# Patient Record
Sex: Female | Born: 1940 | Race: White | Hispanic: No | Marital: Married | Smoking: Never smoker
Health system: Southern US, Community
[De-identification: ages and names within clinical notes are randomized; demographics above are authoritative.]

## PROBLEM LIST (undated history)

## (undated) DIAGNOSIS — J45909 Unspecified asthma, uncomplicated: Secondary | ICD-10-CM

## (undated) DIAGNOSIS — E785 Hyperlipidemia, unspecified: Secondary | ICD-10-CM

## (undated) DIAGNOSIS — F329 Major depressive disorder, single episode, unspecified: Secondary | ICD-10-CM

## (undated) DIAGNOSIS — F32A Depression, unspecified: Secondary | ICD-10-CM

## (undated) DIAGNOSIS — K219 Gastro-esophageal reflux disease without esophagitis: Secondary | ICD-10-CM

## (undated) DIAGNOSIS — M1990 Unspecified osteoarthritis, unspecified site: Secondary | ICD-10-CM

## (undated) DIAGNOSIS — K589 Irritable bowel syndrome without diarrhea: Secondary | ICD-10-CM

## (undated) DIAGNOSIS — G709 Myoneural disorder, unspecified: Secondary | ICD-10-CM

## (undated) DIAGNOSIS — R519 Headache, unspecified: Secondary | ICD-10-CM

## (undated) DIAGNOSIS — Z8719 Personal history of other diseases of the digestive system: Secondary | ICD-10-CM

## (undated) DIAGNOSIS — I10 Essential (primary) hypertension: Secondary | ICD-10-CM

## (undated) DIAGNOSIS — R51 Headache: Secondary | ICD-10-CM

## (undated) HISTORY — PX: EYE SURGERY: SHX253

## (undated) HISTORY — DX: Unspecified osteoarthritis, unspecified site: M19.90

## (undated) HISTORY — DX: Unspecified asthma, uncomplicated: J45.909

## (undated) HISTORY — PX: ABDOMINAL HYSTERECTOMY: SHX81

## (undated) HISTORY — PX: SHOULDER ARTHROSCOPY: SHX128

## (undated) HISTORY — PX: APPENDECTOMY: SHX54

## (undated) HISTORY — PX: CHOLECYSTECTOMY: SHX55

## (undated) HISTORY — PX: KNEE ARTHROSCOPY: SUR90

---

## 2003-08-25 ENCOUNTER — Other Ambulatory Visit: Payer: Self-pay

## 2005-02-27 ENCOUNTER — Ambulatory Visit: Payer: Self-pay | Admitting: Internal Medicine

## 2005-03-04 ENCOUNTER — Ambulatory Visit: Payer: Self-pay | Admitting: Internal Medicine

## 2005-03-06 ENCOUNTER — Ambulatory Visit: Payer: Self-pay | Admitting: Internal Medicine

## 2005-05-28 ENCOUNTER — Ambulatory Visit: Payer: Self-pay | Admitting: Neurosurgery

## 2005-07-16 ENCOUNTER — Ambulatory Visit: Payer: Self-pay | Admitting: Neurosurgery

## 2005-09-07 ENCOUNTER — Encounter: Payer: Self-pay | Admitting: Orthopaedic Surgery

## 2005-09-10 ENCOUNTER — Encounter: Payer: Self-pay | Admitting: Orthopaedic Surgery

## 2005-09-12 ENCOUNTER — Emergency Department: Payer: Self-pay | Admitting: Emergency Medicine

## 2005-10-08 ENCOUNTER — Encounter: Payer: Self-pay | Admitting: Orthopaedic Surgery

## 2005-10-19 ENCOUNTER — Ambulatory Visit: Payer: Self-pay

## 2005-10-23 ENCOUNTER — Ambulatory Visit: Payer: Self-pay

## 2005-11-08 ENCOUNTER — Encounter: Payer: Self-pay | Admitting: Orthopaedic Surgery

## 2006-10-27 ENCOUNTER — Encounter: Payer: Self-pay | Admitting: Internal Medicine

## 2006-11-09 ENCOUNTER — Encounter: Payer: Self-pay | Admitting: Internal Medicine

## 2007-01-31 ENCOUNTER — Ambulatory Visit: Payer: Self-pay | Admitting: Ophthalmology

## 2007-01-31 ENCOUNTER — Other Ambulatory Visit: Payer: Self-pay

## 2007-02-08 ENCOUNTER — Ambulatory Visit: Payer: Self-pay | Admitting: Ophthalmology

## 2007-06-14 ENCOUNTER — Ambulatory Visit: Payer: Self-pay | Admitting: Internal Medicine

## 2007-12-05 ENCOUNTER — Ambulatory Visit: Payer: Self-pay | Admitting: Rheumatology

## 2007-12-20 ENCOUNTER — Encounter: Payer: Self-pay | Admitting: Rheumatology

## 2008-01-09 ENCOUNTER — Encounter: Payer: Self-pay | Admitting: Rheumatology

## 2008-01-11 ENCOUNTER — Ambulatory Visit: Payer: Self-pay | Admitting: Obstetrics & Gynecology

## 2008-06-14 ENCOUNTER — Ambulatory Visit: Payer: Self-pay | Admitting: Internal Medicine

## 2008-08-21 ENCOUNTER — Ambulatory Visit: Payer: Self-pay | Admitting: Internal Medicine

## 2009-06-17 ENCOUNTER — Ambulatory Visit: Payer: Self-pay | Admitting: Internal Medicine

## 2009-07-08 ENCOUNTER — Encounter: Payer: Self-pay | Admitting: Urology

## 2009-07-10 ENCOUNTER — Encounter: Payer: Self-pay | Admitting: Urology

## 2009-08-10 ENCOUNTER — Encounter: Payer: Self-pay | Admitting: Urology

## 2010-06-19 ENCOUNTER — Ambulatory Visit: Payer: Self-pay | Admitting: Internal Medicine

## 2010-07-07 ENCOUNTER — Ambulatory Visit: Payer: Self-pay

## 2010-07-21 ENCOUNTER — Ambulatory Visit: Payer: Self-pay | Admitting: Orthopedic Surgery

## 2010-08-06 ENCOUNTER — Encounter: Payer: Self-pay | Admitting: Physician Assistant

## 2010-08-10 ENCOUNTER — Encounter: Payer: Self-pay | Admitting: Physician Assistant

## 2010-09-27 ENCOUNTER — Emergency Department: Payer: Self-pay | Admitting: Emergency Medicine

## 2010-10-22 ENCOUNTER — Ambulatory Visit: Payer: Self-pay | Admitting: Gastroenterology

## 2010-12-26 NOTE — Assessment & Plan Note (Signed)
NAME:  Ann Benson, LANDRUS NO.:  [id number]   MEDICAL RECORD NO.:  [id number]          PATIENT TYPE:  POB   LOCATION:  [facility] at [facility]         FACILITY:  [facility]   PHYSICIAN:  Allie Bossier, MD        DATE OF BIRTH:  June 16, 1941   DATE OF SERVICE:                                  CLINIC NOTE   Ms. Kempton is a very pleasant 70 year old white female on G2, P2 who  comes here for evaluation of vaginal blisters and burning with  urination.  She says that she has seen previous physicians and was told  it was not herpes. She has tried Premarin cream (3 tubes) and denies  any improvement.  She says that even though she is a newly wed  (approximately 1 year).  She is rarely able to have sex because of the  atrophy and pain.  External genitalia, she has severe atrophy.  She has  excoriations of her left labia minora near the clitoris and obliteration  of the clitoris due to overlying labia minora.  In her mouth, on her  left upper gum mucosa she has lesions consistent with lichen planus.   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  I have given  her Temovate cream to be used every night for a week and then 3 times a  week.  She will followup in 1-2 months.  If no improvement, I would  suggest a biopsy and I will order HSV-2 iGg to be started.  There is no  HSV involvement.      Allie Bossier, MD     MCD/MEDQ  D:  01/19/2008  T:  01/19/2008  Job:  [id number]

## 2011-01-08 ENCOUNTER — Encounter: Payer: Self-pay | Admitting: Internal Medicine

## 2011-01-09 ENCOUNTER — Encounter: Payer: Self-pay | Admitting: Internal Medicine

## 2011-01-12 ENCOUNTER — Ambulatory Visit: Payer: Self-pay | Admitting: Gastroenterology

## 2011-03-18 ENCOUNTER — Ambulatory Visit: Payer: Self-pay | Admitting: Internal Medicine

## 2011-03-23 ENCOUNTER — Emergency Department: Payer: Self-pay | Admitting: Unknown Physician Specialty

## 2011-03-24 ENCOUNTER — Ambulatory Visit: Payer: Self-pay | Admitting: Unknown Physician Specialty

## 2011-06-22 ENCOUNTER — Ambulatory Visit: Payer: Self-pay | Admitting: Internal Medicine

## 2011-06-23 ENCOUNTER — Ambulatory Visit: Payer: Self-pay | Admitting: Internal Medicine

## 2012-06-23 ENCOUNTER — Ambulatory Visit: Payer: Self-pay | Admitting: Internal Medicine

## 2013-07-10 ENCOUNTER — Ambulatory Visit: Payer: Self-pay | Admitting: Internal Medicine

## 2013-07-10 HISTORY — PX: BREAST BIOPSY: SHX20

## 2013-07-24 ENCOUNTER — Ambulatory Visit: Payer: Self-pay | Admitting: Internal Medicine

## 2013-07-26 ENCOUNTER — Ambulatory Visit: Payer: Self-pay | Admitting: Internal Medicine

## 2013-07-28 LAB — PATHOLOGY REPORT

## 2013-09-12 ENCOUNTER — Ambulatory Visit: Payer: Self-pay | Admitting: Internal Medicine

## 2013-10-18 ENCOUNTER — Ambulatory Visit: Payer: Self-pay | Admitting: Internal Medicine

## 2013-10-27 ENCOUNTER — Encounter: Payer: Self-pay | Admitting: Nurse Practitioner

## 2013-11-08 ENCOUNTER — Encounter: Payer: Self-pay | Admitting: Nurse Practitioner

## 2013-12-08 ENCOUNTER — Encounter: Payer: Self-pay | Admitting: Nurse Practitioner

## 2013-12-21 DIAGNOSIS — R600 Localized edema: Secondary | ICD-10-CM | POA: Insufficient documentation

## 2013-12-30 ENCOUNTER — Observation Stay: Payer: Self-pay | Admitting: Internal Medicine

## 2013-12-30 LAB — COMPREHENSIVE METABOLIC PANEL
Albumin: 4.1 g/dL (ref 3.4–5.0)
Alkaline Phosphatase: 76 U/L
Anion Gap: 5 — ABNORMAL LOW (ref 7–16)
BUN: 15 mg/dL (ref 7–18)
Bilirubin,Total: 0.7 mg/dL (ref 0.2–1.0)
Calcium, Total: 9.3 mg/dL (ref 8.5–10.1)
Chloride: 105 mmol/L (ref 98–107)
Co2: 28 mmol/L (ref 21–32)
Creatinine: 1.02 mg/dL (ref 0.60–1.30)
EGFR (African American): >60
EGFR (Non-African Amer.): 54 — ABNORMAL LOW
Glucose: 106 mg/dL — ABNORMAL HIGH (ref 65–99)
Osmolality: 277 (ref 275–301)
Potassium: 4.3 mmol/L (ref 3.5–5.1)
SGOT(AST): 45 U/L — ABNORMAL HIGH (ref 15–37)
SGPT (ALT): 36 U/L (ref 12–78)
Sodium: 138 mmol/L (ref 136–145)
Total Protein: 8.2 g/dL (ref 6.4–8.2)

## 2013-12-30 LAB — CBC
HCT: 50.2 % — ABNORMAL HIGH (ref 35.0–47.0)
HGB: 16.4 g/dL — ABNORMAL HIGH (ref 12.0–16.0)
MCH: 29.3 pg (ref 26.0–34.0)
MCHC: 32.7 g/dL (ref 32.0–36.0)
MCV: 90 fL (ref 80–100)
Platelet: 188 10*3/uL (ref 150–440)
RBC: 5.6 10*6/uL — ABNORMAL HIGH (ref 3.80–5.20)
RDW: 13.7 % (ref 11.5–14.5)
WBC: 7.2 10*3/uL (ref 3.6–11.0)

## 2013-12-30 LAB — TROPONIN I
Troponin-I: <0.02 ng/mL
Troponin-I: <0.02 ng/mL
Troponin-I: <0.02 ng/mL

## 2013-12-30 LAB — CK TOTAL AND CKMB (NOT AT ARMC)
CK, Total: 64 U/L
CK-MB: 2.1 ng/mL (ref 0.5–3.6)

## 2013-12-30 LAB — LIPASE, BLOOD: Lipase: 102 U/L (ref 73–393)

## 2013-12-31 LAB — LIPID PANEL
Cholesterol: 250 mg/dL — ABNORMAL HIGH (ref 0–200)
HDL Cholesterol: 31 mg/dL — ABNORMAL LOW (ref 40–60)
Ldl Cholesterol, Calc: 183 mg/dL — ABNORMAL HIGH (ref 0–100)
Triglycerides: 182 mg/dL (ref 0–200)
VLDL Cholesterol, Calc: 36 mg/dL (ref 5–40)

## 2013-12-31 LAB — BASIC METABOLIC PANEL
Anion Gap: 7 (ref 7–16)
BUN: 14 mg/dL (ref 7–18)
Calcium, Total: 8.8 mg/dL (ref 8.5–10.1)
Chloride: 103 mmol/L (ref 98–107)
Co2: 27 mmol/L (ref 21–32)
Creatinine: 1.02 mg/dL (ref 0.60–1.30)
EGFR (African American): >60
EGFR (Non-African Amer.): 54 — ABNORMAL LOW
Glucose: 96 mg/dL (ref 65–99)
Osmolality: 274 (ref 275–301)
Potassium: 4.1 mmol/L (ref 3.5–5.1)
Sodium: 137 mmol/L (ref 136–145)

## 2013-12-31 LAB — CBC WITH DIFFERENTIAL/PLATELET
Basophil #: 0 10*3/uL (ref 0.0–0.1)
Basophil %: 0.6 %
Eosinophil #: 0.1 10*3/uL (ref 0.0–0.7)
Eosinophil %: 2 %
HCT: 46.8 % (ref 35.0–47.0)
HGB: 15.6 g/dL (ref 12.0–16.0)
Lymphocyte #: 3.1 10*3/uL (ref 1.0–3.6)
Lymphocyte %: 40.3 %
MCH: 30.1 pg (ref 26.0–34.0)
MCHC: 33.4 g/dL (ref 32.0–36.0)
MCV: 90 fL (ref 80–100)
Monocyte #: 0.8 x10 3/mm (ref 0.2–0.9)
Monocyte %: 10.6 %
Neutrophil #: 3.5 10*3/uL (ref 1.4–6.5)
Neutrophil %: 46.5 %
Platelet: 179 10*3/uL (ref 150–440)
RBC: 5.19 10*6/uL (ref 3.80–5.20)
RDW: 14 % (ref 11.5–14.5)
WBC: 7.6 10*3/uL (ref 3.6–11.0)

## 2013-12-31 LAB — TSH: Thyroid Stimulating Horm: 2.85 u[IU]/mL

## 2014-01-01 LAB — SEDIMENTATION RATE: Erythrocyte Sed Rate: 21 mm/hr (ref 0–30)

## 2014-01-02 LAB — CBC WITH DIFFERENTIAL/PLATELET
Basophil #: 0 10*3/uL (ref 0.0–0.1)
Basophil %: 0.5 %
Eosinophil #: 0.2 10*3/uL (ref 0.0–0.7)
Eosinophil %: 2.8 %
HCT: 45.5 % (ref 35.0–47.0)
HGB: 15.1 g/dL (ref 12.0–16.0)
Lymphocyte #: 3.1 10*3/uL (ref 1.0–3.6)
Lymphocyte %: 38.7 %
MCH: 30 pg (ref 26.0–34.0)
MCHC: 33.2 g/dL (ref 32.0–36.0)
MCV: 90 fL (ref 80–100)
Monocyte #: 1 x10 3/mm — ABNORMAL HIGH (ref 0.2–0.9)
Monocyte %: 12.2 %
Neutrophil #: 3.7 10*3/uL (ref 1.4–6.5)
Neutrophil %: 45.8 %
Platelet: 167 10*3/uL (ref 150–440)
RBC: 5.04 10*6/uL (ref 3.80–5.20)
RDW: 14 % (ref 11.5–14.5)
WBC: 8 10*3/uL (ref 3.6–11.0)

## 2014-01-08 ENCOUNTER — Ambulatory Visit: Payer: Self-pay | Admitting: Orthopedic Surgery

## 2014-01-15 ENCOUNTER — Ambulatory Visit: Payer: Self-pay | Admitting: Orthopedic Surgery

## 2014-01-16 ENCOUNTER — Ambulatory Visit: Payer: Self-pay | Admitting: Orthopedic Surgery

## 2014-01-18 LAB — PATHOLOGY REPORT

## 2014-03-15 ENCOUNTER — Ambulatory Visit: Payer: Self-pay | Admitting: Internal Medicine

## 2014-11-21 ENCOUNTER — Ambulatory Visit
Admit: 2014-11-21 | Disposition: A | Discharge status: Home / Self Care | Payer: Self-pay | Attending: Unknown Physician Specialty | Admitting: Unknown Physician Specialty

## 2014-11-27 DIAGNOSIS — R1310 Dysphagia, unspecified: Secondary | ICD-10-CM | POA: Insufficient documentation

## 2014-12-01 NOTE — Discharge Summary (Signed)
PATIENT NAME:  Ann Benson, Ann Benson MR#:  [id number] DATE OF BIRTH:  12-25-1940  DATE OF ADMISSION:  12/30/2013 DATE OF DISCHARGE: 01/03/2014   FINAL DIAGNOSES:  1.  Chest pain, noncardiac, likely gastritis.  2.  Vertigo.  3.  Anxiety.  4.  Hyperlipidemia.  5.  History of Schatzki's ring.  6.  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   HISTORY AND PHYSICAL: Please see dictated admission history and physical.   HOSPITAL COURSE: The patient was admitted with episode of nausea, vomiting, chest discomfort, as well as dizziness and vertigo. Cardiac enzymes were followed, which were negative. She was seen by cardiology, and she underwent stress testing, which was negative. Echocardiogram also result revealed preserved LV function without significant valvular heart disease. Proton pump inhibitors were increased. Meclizine was added as well. With this combination of medications, her symptoms resolved completely. Physical therapy ambulated the patient and also will put her through some neck exercises, and she did well with this. It was felt that she was ready for discharge to home and that she should resume her home exercise regimen, did not require physical therapy to be ongoing. At time of discharge, she is in stable condition. Her physical activity will be up as tolerated. She will follow up in our office in the next 1 to 2 weeks. She should follow a 2 gram sodium diet.   DISCHARGE MEDICATIONS:  1.  Temazepam 30 mg  p.o. at bedtime.  2.  Cymbalta 60 mg p.o. daily.  3.  Pantoprazole 40 mg p.o. b.i.d.  4.  Meclizine 12.5 mg p.o. q.6 hours as needed for vertigo or dizziness. 5.  Ondansetron 4 mg p.o. t.i.d. as needed for nausea or vomiting.  6.  Buspirone 15 mg 1/2to 1 tablet p.o. b.i.d. as needed for anxiety or nervousness.   It was recommended she stop vitamin C and vitamin E. She should avoid Aleve and Advil, may use Tylenol as needed for further pain. She will follow  up with vascular surgery and with orthopedics for her lower back as is already planned.   ____________________________ Lynnea FerrierBert J. Klein III, MD bjk:aw D: 01/03/2014 13:15:32 ET T: 01/03/2014 13:47:16 ET JOB#: [id number]  cc: Lynnea FerrierBert J. Klein III, MD, <Dictator> [name] MD ELECTRONICALLY SIGNED 01/10/2014 8:10

## 2014-12-01 NOTE — H&P (Signed)
PATIENT NAME:  Ann Benson, Ann Benson MR#:  [id number] DATE OF BIRTH:  [birth year]  DATE OF ADMISSION:  12/30/2013  PRIMARY CARE PHYSICIAN:  Dr. [name].  CHIEF COMPLAINT:  Chest pain.   HISTORY OF PRESENT ILLNESS:  This is a 74 year old female who states that all of her problems started after she fell down the steps in March.  She developed some swelling in the legs and has not been right since, some pain in the neck.  Yesterday she was at the [employer] store, felt sick and dizzy, felt tightness in the chest, very nauseated.  She took a few sips of Pepsi, was able to drive herself home.  She was also short of breath at that time.  Once she got home she did vomit.  She states that her chest pain did radiate up into the jaw and into her teeth.  Before this episode at the [employer] store she felt like she was going to pass out.  This morning when she woke up she felt dizzy and nauseous.  Did not eat much.  No complaints of any diarrhea.  She did have some chest tightness also.  Hospitalist services were contacted for further evaluation.   PAST MEDICAL HISTORY:  Leg swelling, hyperlipidemia, history of esophageal dilation with a Schatzki's ring in the past and arthritis.   PAST SURGICAL HISTORY:  Cholecystectomy, shoulder surgery, partial hysterectomy, breast biopsy.   ALLERGIES:  IN THE COMPUTER LISTED AS ASPIRIN, STATINS AND TYLENOL.   MEDICATIONS:  Include Protonix 40 mg daily, Omega 3 2 pills a day, Cymbalta 60 mg daily, temazepam at night as needed.   SOCIAL HISTORY:  No smoking.  No alcohol.  No drug use.  Lives with her husband.  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   FAMILY HISTORY:  Father died of cirrhosis.  Mother had diabetes.  Two brothers that died, one of sepsis after his gallbladder ruptured, another one of heart failure.  He had diabetes.   REVIEW OF SYSTEMS:  CONSTITUTIONAL:  Positive for sweating.  No fever or chills.  Positive for weakness.   Positive for weight gain.  EYES:  She does have a cataract on the left side.  EARS, NOSE, MOUTH AND THROAT:  Positive for dysphagia in the past and currently with meats.  CARDIOVASCULAR:  Positive for chest pain.  RESPIRATORY:  Positive for shortness of breath.  GASTROINTESTINAL:  Positive for nausea, vomiting, constipation, blood with hemorrhoids.  GENITOURINARY:  No burning on urination.  No hematuria.  MUSCULOSKELETAL:  Positive for joint pain.  INTEGUMENT:  No rashes or eruptions.  NEUROLOGIC:  She felt faint.  PSYCHIATRIC:  Positive for anxiety.  ENDOCRINE:  No thyroid problems.  HEMATOLOGIC AND LYMPHATIC:  No anemia, no easy bruising or bleeding.   PHYSICAL EXAMINATION: VITAL SIGNS:  Temperature 97.7, pulse 73, respirations 18, blood pressure 146/74, pulse ox 94% on room air.  GENERAL:  No respiratory distress.  EYES:  Conjunctivae and lids normal.  Pupils equal, round, and reactive to light.  Extraocular muscles intact.  No nystagmus.  EARS, NOSE, MOUTH AND THROAT:  Tympanic membranes:  No erythema.  Nasal mucosa:  No erythema.  Throat:  No erythema.  No exudate seen.  Lips and gums:  No lesions.  NECK:  No JVD.  No bruits.  No lymphadenopathy.  No thyromegaly.  No thyroid nodules palpated.  LUNGS:  Clear to auscultation.  No use of accessory muscles to breathe.  No rhonchi, rales, or wheeze  heard.  CARDIOVASCULAR:  S1, S2 normal.  No gallops, rubs, or murmurs heard.  Carotid upstroke 2+ bilaterally.  Dorsalis pedis pulses 1+ bilaterally.  Trace edema of the lower extremity.  ABDOMEN:  Soft, nontender.  No organomegaly/splenomegaly.  Normoactive bowel sounds.  No masses felt.  Chest wall:  No pain to palpation.  LYMPHATIC:  No lymph nodes in the neck.  MUSCULOSKELETAL:  Trace edema.  No clubbing.  No cyanosis.  SKIN:  No ulcers or lesions seen.  On the lower extremities, cool to touch. NEUROLOGIC:  Cranial nerves II through XII grossly intact.  Deep tendon reflexes half plus  bilateral lower extremity.  PSYCHIATRIC:  The patient is oriented to person, place, and time.   LABORATORY AND RADIOLOGICAL DATA:  Chest x-ray:  No acute cardiopulmonary disease.  L1 compression fracture deformity.  CPK 64, glucose 106, BUN 15, creatinine 1.02, sodium 138, potassium 4.3, chloride 105, CO2 28, calcium 9.3.  Liver function tests:  AST slightly elevated at 45.  White blood cell count 7.2.  Hemoglobin 16.4, hematocrit 50.2, platelet count of 188.  Troponin negative.  Lipase 102.   EKG:  Flipped T waves laterally.   ASSESSMENT AND PLAN: 1.  Chest pain, on a holiday weekend, unfortunately, no stress test available.  I spoke with Dr. Lady Gary cardiology to evaluate.  We will get serial cardiac enzymes to rule out myocardial infarction.  THE PATIENT HAS AN ALLERGY TO ASPIRIN.  IT IS NOT A REAL ALLERGY.  IT IS MORE THAT SHE BLEEDS EASILY.  If she does have an acute myocardial infarction we can give aspirin.  I will hold off on aspirin at this point because I am not sure if this is gastrointestinal related.  I will try a gastrointestinal cocktail and increase the Protonix to twice a day.  I will stop the omega-3 fatty acid because that can also cause an upset stomach.  Could also be anxiety related.  The patient is supposed to go on a trip on Tuesday, probably will not be able to get a cardiac work-up prior to that.  2.  Hyperlipidemia.  We will hold on the omega-3 fatty acids.  3.  Anxiety.  Continue Cymbalta.  We will give some as needed Xanax.  4.  Gastroesophageal reflux disease with history of Schatzki's ring with dilatation in the past.  We will increase to twice daily proton pump inhibitor and continue to monitor.    CODE STATUS:  FULL CODE.  Time spent on admission 55 minutes.     ____________________________ Herschell Dimes. Renae Gloss, MD rjw:ea D: 12/30/2013 13:57:14 ET T: 12/30/2013 16:29:50 ET JOB#: [id number]  cc: Herschell Dimes. Renae Gloss, MD, <Dictator> Curtis Sites III, MD Salley Scarlet MD ELECTRONICALLY SIGNED 12/30/2013 17:41

## 2014-12-01 NOTE — Op Note (Signed)
PATIENT NAME:  Ann Benson, Maddix W MR#:  [id number] DATE OF BIRTH:  1941-02-09  DATE OF PROCEDURE:  01/16/2014  PREOPERATIVE DIAGNOSIS: L1 compression fracture.   POSTOPERATIVE DIAGNOSIS: L1 compression fracture.   PROCEDURE: L1 kyphoplasty and biopsy.   ANESTHESIA: MAC.   DESCRIPTION OF PROCEDURE: The patient was brought to the operating room and after adequate anesthesia was obtained, the patient was placed prone. C-arm was brought in and good visualization and AP and lateral projections were obtained at L1. After patient identification and timeout procedures were completed, 10 mL of 1% Xylocaine was infiltrated on both sides in the subcutaneous tissue. The back was then prepped and draped in the usual sterile fashion. Repeat timeout procedure completed. Spinal needle was used to inject a 50:50 mixture of 1% Xylocaine and 0.5% Sensorcaine with epinephrine down to the pedicle. This was done on both sides with approximately 20 mL on both sides. A small stab incision was made on the right, trocar advanced and a perpendicular approach gave access to the vertebral body. A biopsy was obtained without difficulty and then drilling.  I actually carried this crossed the midline, so a single-sided stick was all that was required. The balloon was inflated to approximately 3 mL with partial correction of deformity. The balloon was removed and the cement inserted with good fill. The trocar was removed and permanent C-arm views were obtained. The wounds was closed with Dermabond and covered with a Band-Aid. The patient was then sent to the recovery room in stable condition.   ESTIMATED BLOOD LOSS: Minimal.   COMPLICATIONS: None.   SPECIMEN: L1 vertebral body biopsy.   CONDITION: To recovery room stable.     ____________________________ Leitha SchullerMichael J. Menz, MD mjm:dmm D: 01/16/2014 20:14:00 ET T: 01/16/2014 21:25:46 ET JOB#: [id number]  cc: Leitha SchullerMichael J. Menz, MD, <Dictator> Leitha SchullerMICHAEL J MENZ MD ELECTRONICALLY  SIGNED 01/17/2014 8:09

## 2014-12-01 NOTE — Consult Note (Signed)
Brief Consult Note: Diagnosis: chest pain with dizziness and leg pain.   Patient was seen by consultant.   Recommend further assessment or treatment.   Orders entered.   Comments: 74 yo female with no cardiac history who was admitted iwth an episode of dizziness, chest pain, jaw pain, nausea and diapharesis. Has ruled out for mi. CHest ct negative for pe. Has leg pain of unclear etiology. WIll need to rule out structural disease as well as ischemia. WIll do echo and lexiscan mibi in am. Further recs after this. Conintue curent meds.  Electronic Signatures: Dalia HeadingFath, Kenneth A (MD)  (Signed 25-May-15 11:37)  Authored: Brief Consult Note   Last Updated: 25-May-15 11:37 by Dalia HeadingFath, Kenneth A (MD)

## 2014-12-05 ENCOUNTER — Ambulatory Visit
Admit: 2014-12-05 | Disposition: A | Discharge status: Home / Self Care | Payer: Self-pay | Attending: Unknown Physician Specialty | Admitting: Unknown Physician Specialty

## 2014-12-06 LAB — SURGICAL PATHOLOGY

## 2015-04-11 DIAGNOSIS — I10 Essential (primary) hypertension: Secondary | ICD-10-CM | POA: Insufficient documentation

## 2015-04-22 ENCOUNTER — Other Ambulatory Visit: Payer: Self-pay | Admitting: Internal Medicine

## 2015-04-22 DIAGNOSIS — Z1231 Encounter for screening mammogram for malignant neoplasm of breast: Secondary | ICD-10-CM

## 2015-04-25 ENCOUNTER — Ambulatory Visit
Admission: RE | Admit: 2015-04-25 | Discharge: 2015-04-25 | Disposition: A | Discharge status: Home / Self Care | Payer: PPO | Source: Ambulatory Visit | Attending: Internal Medicine | Admitting: Internal Medicine

## 2015-04-25 DIAGNOSIS — Z1231 Encounter for screening mammogram for malignant neoplasm of breast: Secondary | ICD-10-CM

## 2015-04-25 DIAGNOSIS — R928 Other abnormal and inconclusive findings on diagnostic imaging of breast: Secondary | ICD-10-CM | POA: Diagnosis not present

## 2015-04-29 ENCOUNTER — Other Ambulatory Visit: Payer: Self-pay | Admitting: Internal Medicine

## 2015-04-29 DIAGNOSIS — R928 Other abnormal and inconclusive findings on diagnostic imaging of breast: Secondary | ICD-10-CM

## 2015-05-02 ENCOUNTER — Ambulatory Visit
Admission: RE | Admit: 2015-05-02 | Discharge: 2015-05-02 | Disposition: A | Discharge status: Home / Self Care | Payer: PPO | Source: Ambulatory Visit | Attending: Internal Medicine | Admitting: Internal Medicine

## 2015-05-02 DIAGNOSIS — R928 Other abnormal and inconclusive findings on diagnostic imaging of breast: Secondary | ICD-10-CM

## 2015-05-02 DIAGNOSIS — N63 Unspecified lump in breast: Secondary | ICD-10-CM | POA: Insufficient documentation

## 2015-09-18 ENCOUNTER — Ambulatory Visit
Admission: RE | Admit: 2015-09-18 | Discharge: 2015-09-18 | Disposition: A | Discharge status: Home / Self Care | Payer: PPO | Source: Ambulatory Visit | Attending: Internal Medicine | Admitting: Internal Medicine

## 2015-09-18 ENCOUNTER — Other Ambulatory Visit: Payer: Self-pay | Admitting: Internal Medicine

## 2015-09-18 ENCOUNTER — Other Ambulatory Visit: Payer: PPO

## 2015-09-18 DIAGNOSIS — R600 Localized edema: Secondary | ICD-10-CM

## 2015-09-18 DIAGNOSIS — M79605 Pain in left leg: Secondary | ICD-10-CM

## 2015-09-18 DIAGNOSIS — I10 Essential (primary) hypertension: Secondary | ICD-10-CM | POA: Diagnosis not present

## 2015-09-19 ENCOUNTER — Ambulatory Visit (INDEPENDENT_AMBULATORY_CARE_PROVIDER_SITE_OTHER): Payer: PPO | Admitting: Podiatry

## 2015-09-19 ENCOUNTER — Ambulatory Visit (INDEPENDENT_AMBULATORY_CARE_PROVIDER_SITE_OTHER): Payer: PPO

## 2015-09-19 ENCOUNTER — Encounter: Payer: Self-pay | Admitting: Podiatry

## 2015-09-19 DIAGNOSIS — L603 Nail dystrophy: Secondary | ICD-10-CM

## 2015-09-19 DIAGNOSIS — R52 Pain, unspecified: Secondary | ICD-10-CM

## 2015-09-19 NOTE — Progress Notes (Signed)
   Subjective:    Patient ID: Ann Benson, female    DOB: [birth year], 75 y.o.   MRN: [id number]  HPI  Ann Benson presents to the office today for pain to both of her big toes which has been ongoing for about 1 year. She states the toes "throb" at times. She is unsure if it is the actual toe or the toenail. She states it hurts when she walks a lot at the [facility]. The pain has been progressive. She states she has been having some swelling to her legs but saw her PCP yesterday and was negative for blood clot on ultrasound. Started lasix yesterday, but has not started yet. No injury or trauma recently. She did fall down the steps last year. She did seek treatment for this.     Review of Systems  All other systems reviewed and are negative.      Objective:   Physical Exam General: AAO x3, NAD  Dermatological: Bilateral hallux nails are hypertrophic, dystrophic, discolored, elongated. There appears to be some subungual hematoma underlying both hallux toenails. There is also incurvation of the distal portion of both the medial lateral nail borders. No surrounding erythema or drainage. Tenderness to palpation to both toenails.   Vascular: Dorsalis Pedis artery and Posterior Tibial artery pedal pulses are 2/4 bilateral with immedate capillary fill time. Pedal hair growth present. No varicosities and no lower extremity edema present bilateral. There is no pain with calf compression, swelling, warmth, erythema.   Neruologic: Grossly intact via light touch bilateral. Vibratory intact via tuning fork bilateral. Protective threshold with Semmes Wienstein monofilament intact to all pedal sites bilateral. Patellar and Achilles deep tendon reflexes 2+ bilateral. No Babinski or clonus noted bilateral.   Musculoskeletal: No gross boney pedal deformities bilateral. There is no area pinpoint bony tenderness or pain the vibratory sensation. There is no pain with MPJ range of motion. No pain, crepitus, or  limitation noted with foot and ankle range of motion bilateral. Muscular strength 5/5 in all groups tested bilateral.  Gait: Unassisted, Nonantalgic.       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  -Etiology of symptoms were discussed -I discussed toenail avulsion versus debridement. She was to pursue with debridement. Bilateral hallux ulcer debrided without complications. After debridement there was resolution of symptoms. Continue to monitor for any recurrence. -Continue Lasix to see this helped the swelling. -Follow-up as needed. In the meantime, encouraged to call the office with any questions, concerns, change in symptoms.   Ovid Curd, DPM

## 2015-10-10 DIAGNOSIS — R600 Localized edema: Secondary | ICD-10-CM | POA: Diagnosis not present

## 2015-10-10 DIAGNOSIS — I10 Essential (primary) hypertension: Secondary | ICD-10-CM | POA: Diagnosis not present

## 2015-10-10 DIAGNOSIS — Z1239 Encounter for other screening for malignant neoplasm of breast: Secondary | ICD-10-CM | POA: Diagnosis not present

## 2015-10-10 DIAGNOSIS — F3342 Major depressive disorder, recurrent, in full remission: Secondary | ICD-10-CM | POA: Insufficient documentation

## 2015-10-10 DIAGNOSIS — E784 Other hyperlipidemia: Secondary | ICD-10-CM | POA: Diagnosis not present

## 2015-10-10 DIAGNOSIS — K219 Gastro-esophageal reflux disease without esophagitis: Secondary | ICD-10-CM | POA: Diagnosis not present

## 2015-10-11 ENCOUNTER — Other Ambulatory Visit: Payer: Self-pay | Admitting: Internal Medicine

## 2015-10-11 DIAGNOSIS — N6320 Unspecified lump in the left breast, unspecified quadrant: Secondary | ICD-10-CM

## 2015-11-01 ENCOUNTER — Ambulatory Visit
Admission: RE | Admit: 2015-11-01 | Discharge: 2015-11-01 | Disposition: A | Discharge status: Home / Self Care | Payer: PPO | Source: Ambulatory Visit | Attending: Internal Medicine | Admitting: Internal Medicine

## 2015-11-01 DIAGNOSIS — N63 Unspecified lump in breast: Secondary | ICD-10-CM | POA: Insufficient documentation

## 2015-11-01 DIAGNOSIS — N6320 Unspecified lump in the left breast, unspecified quadrant: Secondary | ICD-10-CM

## 2016-02-20 DIAGNOSIS — H10023 Other mucopurulent conjunctivitis, bilateral: Secondary | ICD-10-CM | POA: Diagnosis not present

## 2016-04-08 DIAGNOSIS — I10 Essential (primary) hypertension: Secondary | ICD-10-CM | POA: Diagnosis not present

## 2016-04-08 DIAGNOSIS — R600 Localized edema: Secondary | ICD-10-CM | POA: Diagnosis not present

## 2016-04-08 DIAGNOSIS — K219 Gastro-esophageal reflux disease without esophagitis: Secondary | ICD-10-CM | POA: Diagnosis not present

## 2016-04-15 DIAGNOSIS — K219 Gastro-esophageal reflux disease without esophagitis: Secondary | ICD-10-CM | POA: Diagnosis not present

## 2016-04-15 DIAGNOSIS — M545 Low back pain: Secondary | ICD-10-CM | POA: Diagnosis not present

## 2016-04-15 DIAGNOSIS — I739 Peripheral vascular disease, unspecified: Secondary | ICD-10-CM | POA: Diagnosis not present

## 2016-04-15 DIAGNOSIS — F3342 Major depressive disorder, recurrent, in full remission: Secondary | ICD-10-CM | POA: Diagnosis not present

## 2016-04-15 DIAGNOSIS — E784 Other hyperlipidemia: Secondary | ICD-10-CM | POA: Diagnosis not present

## 2016-04-15 DIAGNOSIS — R600 Localized edema: Secondary | ICD-10-CM | POA: Diagnosis not present

## 2016-04-15 DIAGNOSIS — I10 Essential (primary) hypertension: Secondary | ICD-10-CM | POA: Diagnosis not present

## 2016-04-15 IMAGING — MG MM DIAG BREAST TOMO UNI LEFT
6 series · 6 of 14 positions shown · non-contrast
Comparison: Previous exams including diagnostic left breast
mammogram and ultrasound dated 05/02/2015.

CLINICAL DATA: Six-month follow-up for probably benign nodule in
the left breast.

EXAM:
2D DIGITAL DIAGNOSTIC LEFT MAMMOGRAM WITH ADJUNCT TOMO
ULTRASOUND LEFT BREAST

[L CC]
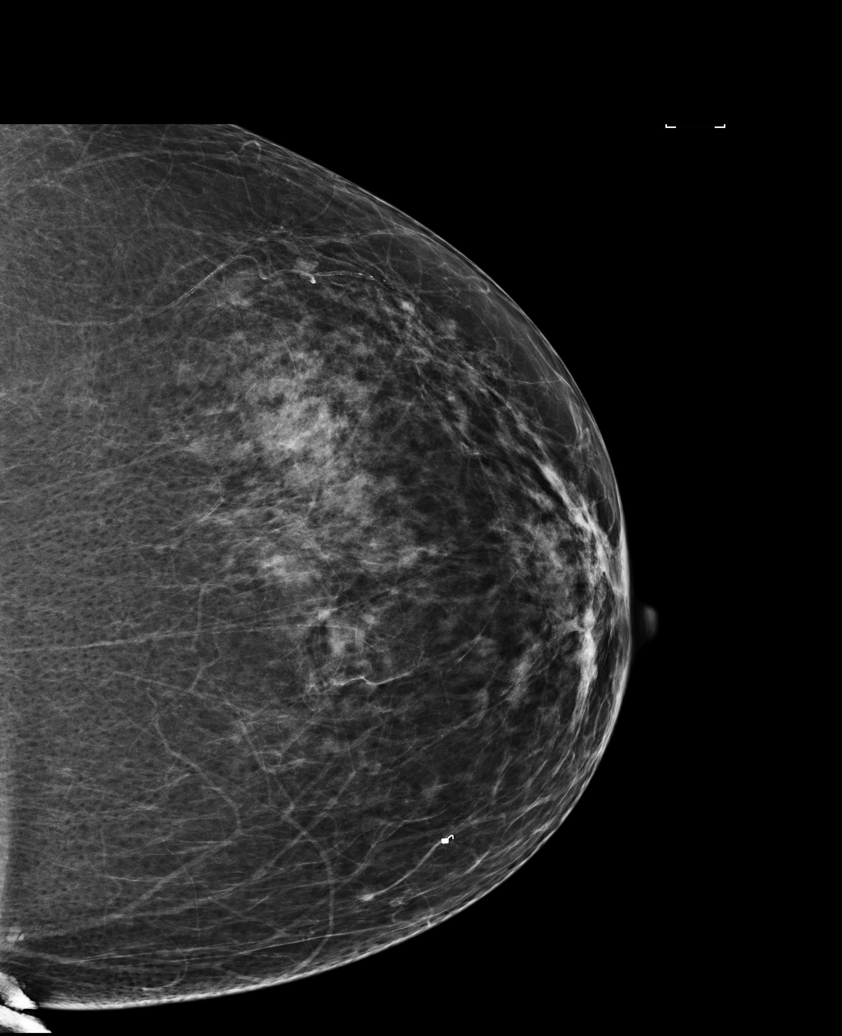

[L CC synth-2D]
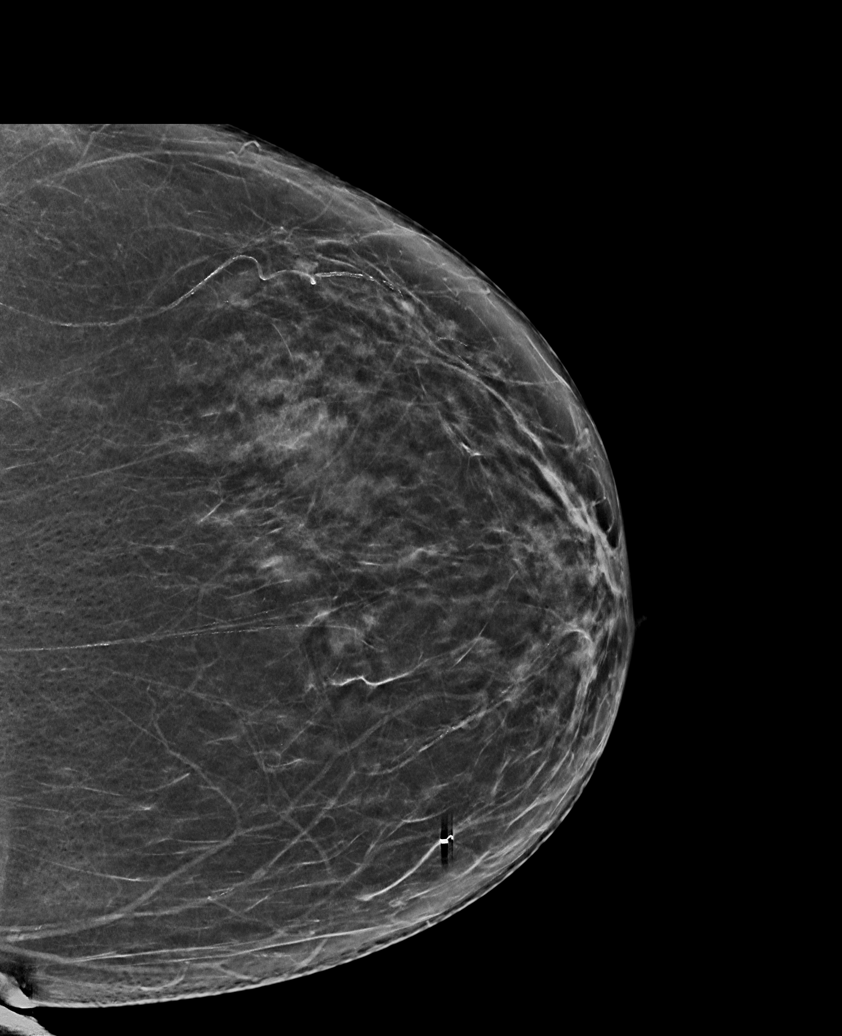

[L MLO]
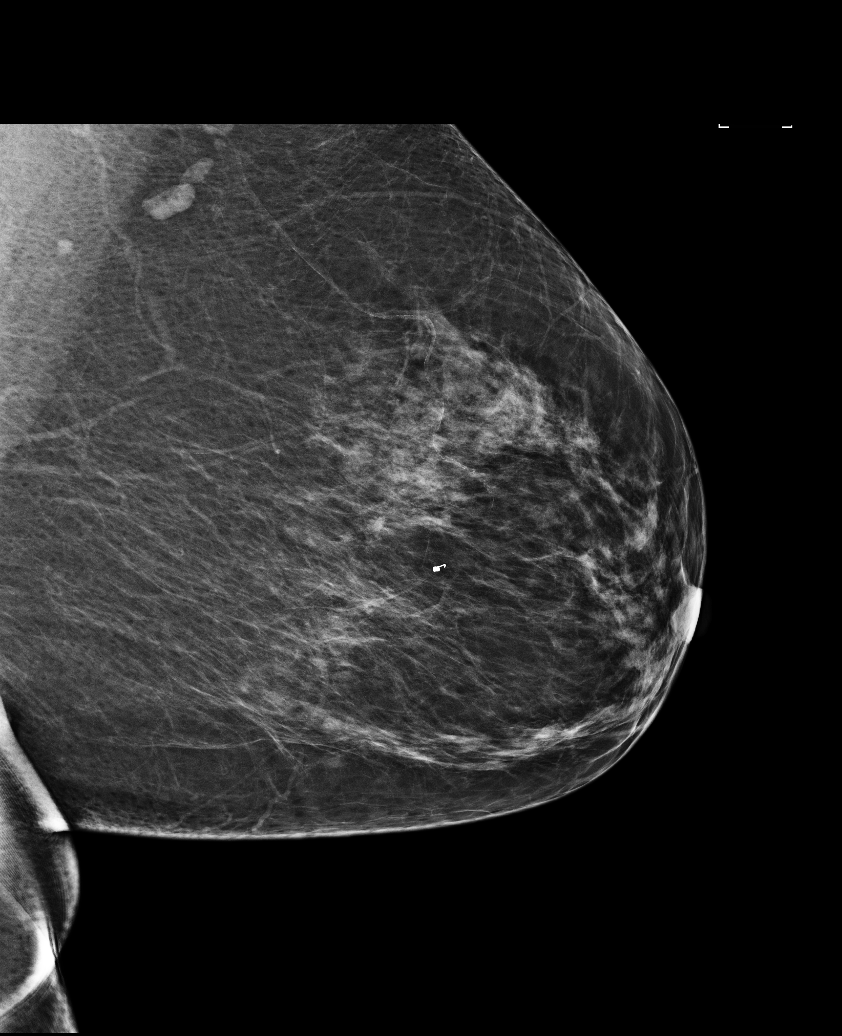

[L MLO synth-2D]
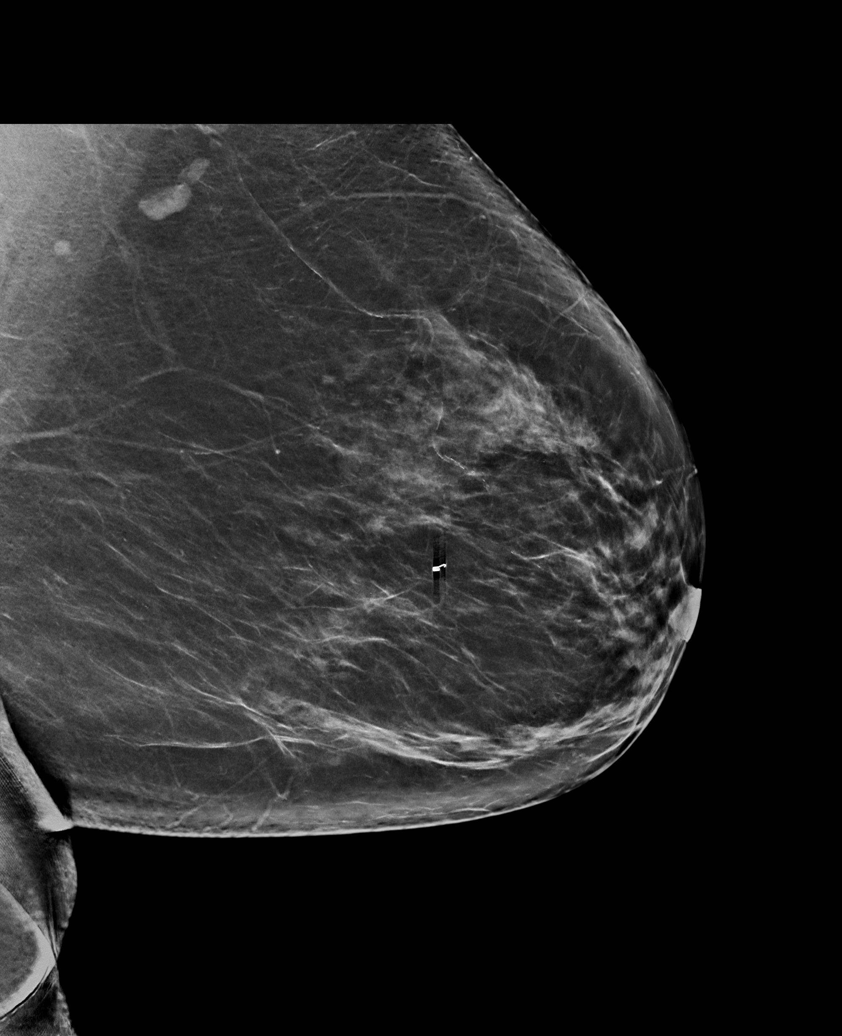

[L MLO tomo · tomo slice 43/86.0]
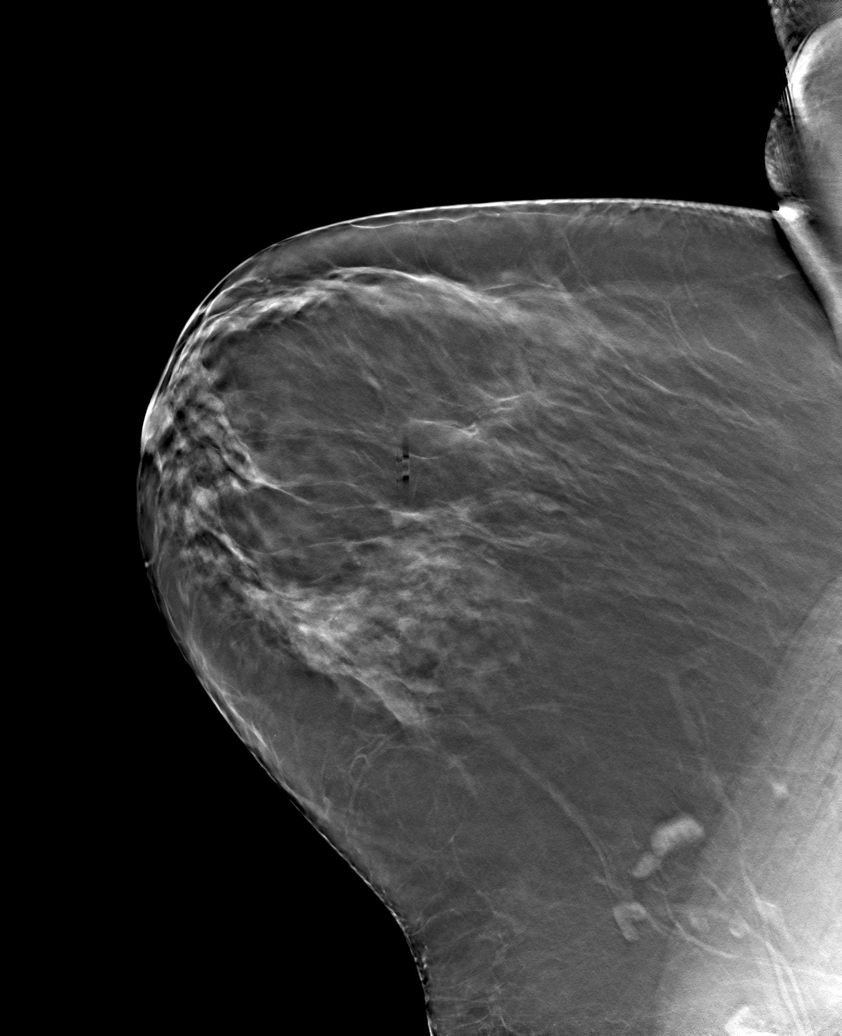

[L CC tomo · tomo slice 35/69.0]
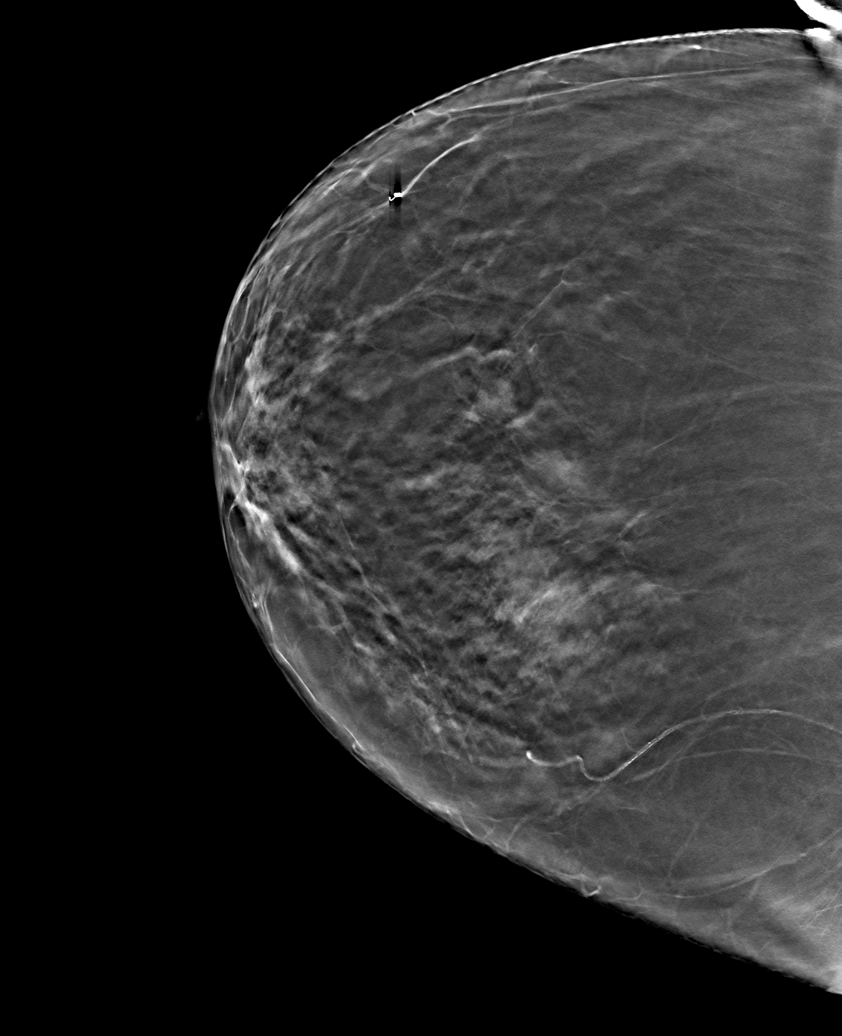

[6 of 14 positions shown; findings below may reference images not displayed]

ACR Breast Density Category b: There are scattered areas of
fibroglandular density.
FINDINGS: There is a stable oval circumscribed mass within the lateral left
left breast, 3 o'clock axis region, measuring approximately 5 mm,
most suggestive of a benign intramammary lymph node, best seen on
tomosynthesis MLO slice 14. There are no new dominant masses,
suspicious calcifications or secondary signs of malignancy within
the left breast. Biopsy site marker within the inner left breast is
stable in position.

Previous ultrasound showed a probably benign hypoechoic mass within
the left breast at the 9 o'clock axis, 9 cm from the nipple, which
will also be further characterized with ultrasound today.

Targeted ultrasound is performed, again showing an oval
circumscribed hypoechoic mass within the left breast at the 9
o'clock axis, 9 cm from the nipple, measuring 0.5 x 0.3 x 0.4 cm,
avascular, stable compared to the previous ultrasound. This is not
felt to correspond to the intramammary lymph node seen on mammogram,
more suggestive of a complicated cyst with internal debris,
corresponding as an incidental finding.
IMPRESSION: Probably benign findings within the left breast, including a
probably benign intramammary lymph node within the lateral left
breast (as seen by mammogram) and a probably benign complicated cyst
at the 9 o'clock axis (as seen by ultrasound).

Recommend additional follow-up left breast diagnostic mammogram and
ultrasound in 6 months to ensure continued stability. This will be
performed as a bilateral diagnostic mammogram in conjunction with
patient's routine right breast screening mammogram schedule.

RECOMMENDATION:
Bilateral diagnostic mammogram, and left breast ultrasound, in 6
months.

I have discussed the findings and recommendations with the patient.
Results were also provided in writing at the conclusion of the
visit. If applicable, a reminder letter will be sent to the patient
regarding the next appointment.

BI-RADS CATEGORY  3: Probably benign.

## 2016-04-16 DIAGNOSIS — I700 Atherosclerosis of aorta: Secondary | ICD-10-CM | POA: Insufficient documentation

## 2016-05-19 DIAGNOSIS — I739 Peripheral vascular disease, unspecified: Secondary | ICD-10-CM | POA: Diagnosis not present

## 2016-05-19 DIAGNOSIS — M79604 Pain in right leg: Secondary | ICD-10-CM | POA: Diagnosis not present

## 2016-05-19 DIAGNOSIS — M79671 Pain in right foot: Secondary | ICD-10-CM | POA: Diagnosis not present

## 2016-05-19 DIAGNOSIS — F3342 Major depressive disorder, recurrent, in full remission: Secondary | ICD-10-CM | POA: Diagnosis not present

## 2016-05-19 DIAGNOSIS — I700 Atherosclerosis of aorta: Secondary | ICD-10-CM | POA: Diagnosis not present

## 2016-05-19 DIAGNOSIS — M79672 Pain in left foot: Secondary | ICD-10-CM | POA: Diagnosis not present

## 2016-05-19 DIAGNOSIS — R7309 Other abnormal glucose: Secondary | ICD-10-CM | POA: Diagnosis not present

## 2016-05-19 DIAGNOSIS — M79605 Pain in left leg: Secondary | ICD-10-CM | POA: Diagnosis not present

## 2016-05-19 DIAGNOSIS — I10 Essential (primary) hypertension: Secondary | ICD-10-CM | POA: Diagnosis not present

## 2016-05-22 ENCOUNTER — Other Ambulatory Visit: Payer: Self-pay | Admitting: Internal Medicine

## 2016-05-22 DIAGNOSIS — N6002 Solitary cyst of left breast: Secondary | ICD-10-CM

## 2016-05-22 DIAGNOSIS — N63 Unspecified lump in unspecified breast: Secondary | ICD-10-CM

## 2016-05-25 ENCOUNTER — Encounter: Payer: Self-pay | Admitting: Podiatry

## 2016-05-25 ENCOUNTER — Ambulatory Visit (INDEPENDENT_AMBULATORY_CARE_PROVIDER_SITE_OTHER): Payer: PPO | Admitting: Podiatry

## 2016-05-25 DIAGNOSIS — I872 Venous insufficiency (chronic) (peripheral): Secondary | ICD-10-CM

## 2016-05-25 DIAGNOSIS — G629 Polyneuropathy, unspecified: Secondary | ICD-10-CM

## 2016-05-25 NOTE — Progress Notes (Signed)
She presents today with a chief complaint of bilateral burning to her legs and her feet she states that her legs or feet or been staying in swelling and burning so bad she can hardly stand up. She states that they burn when she is walking or when she is lying in bed at night. She states that they will turn so read they turn purple. She is currently awaiting a venous duplex which she will have performed tomorrow. She states that her primary care provider has performed multiple blood tests which have all been negative.  Objective: Vital signs are stable she's alert and oriented 3 pulses are palpable. Neurologic sensorium is intact feet are cool to the touch and turn red to purple. Radiographs do not demonstrate any type of osseous abnormalities.  Assessment: Appears to be more of a autonomic neuropathy type scenario rather than venous insufficiency.  Plan: I'm requesting that she continue with her scheduled procedure tomorrow and follow-up with me when she can bring me the reports of the procedure as well as the blood tests. May need to consider Buerger's disease.

## 2016-05-26 DIAGNOSIS — I739 Peripheral vascular disease, unspecified: Secondary | ICD-10-CM | POA: Diagnosis not present

## 2016-05-26 DIAGNOSIS — M79672 Pain in left foot: Secondary | ICD-10-CM | POA: Diagnosis not present

## 2016-05-26 DIAGNOSIS — M79605 Pain in left leg: Secondary | ICD-10-CM | POA: Diagnosis not present

## 2016-05-26 DIAGNOSIS — M79604 Pain in right leg: Secondary | ICD-10-CM | POA: Diagnosis not present

## 2016-05-26 DIAGNOSIS — M79671 Pain in right foot: Secondary | ICD-10-CM | POA: Diagnosis not present

## 2016-05-28 DIAGNOSIS — K219 Gastro-esophageal reflux disease without esophagitis: Secondary | ICD-10-CM | POA: Diagnosis not present

## 2016-05-28 DIAGNOSIS — F3342 Major depressive disorder, recurrent, in full remission: Secondary | ICD-10-CM | POA: Diagnosis not present

## 2016-05-28 DIAGNOSIS — F419 Anxiety disorder, unspecified: Secondary | ICD-10-CM | POA: Diagnosis not present

## 2016-05-28 DIAGNOSIS — I739 Peripheral vascular disease, unspecified: Secondary | ICD-10-CM | POA: Diagnosis not present

## 2016-05-28 DIAGNOSIS — R202 Paresthesia of skin: Secondary | ICD-10-CM | POA: Diagnosis not present

## 2016-05-28 DIAGNOSIS — G5791 Unspecified mononeuropathy of right lower limb: Secondary | ICD-10-CM | POA: Diagnosis not present

## 2016-05-28 DIAGNOSIS — I700 Atherosclerosis of aorta: Secondary | ICD-10-CM | POA: Diagnosis not present

## 2016-05-28 DIAGNOSIS — G5792 Unspecified mononeuropathy of left lower limb: Secondary | ICD-10-CM | POA: Diagnosis not present

## 2016-05-28 DIAGNOSIS — I10 Essential (primary) hypertension: Secondary | ICD-10-CM | POA: Diagnosis not present

## 2016-05-28 DIAGNOSIS — E784 Other hyperlipidemia: Secondary | ICD-10-CM | POA: Diagnosis not present

## 2016-05-28 DIAGNOSIS — D751 Secondary polycythemia: Secondary | ICD-10-CM | POA: Diagnosis not present

## 2016-05-29 ENCOUNTER — Other Ambulatory Visit: Payer: Self-pay | Admitting: Internal Medicine

## 2016-05-29 DIAGNOSIS — G5793 Unspecified mononeuropathy of bilateral lower limbs: Secondary | ICD-10-CM

## 2016-06-10 ENCOUNTER — Ambulatory Visit
Admission: RE | Admit: 2016-06-10 | Discharge: 2016-06-10 | Disposition: A | Discharge status: Home / Self Care | Payer: PPO | Source: Ambulatory Visit | Attending: Internal Medicine | Admitting: Internal Medicine

## 2016-06-10 DIAGNOSIS — N63 Unspecified lump in unspecified breast: Secondary | ICD-10-CM

## 2016-06-10 DIAGNOSIS — N6002 Solitary cyst of left breast: Secondary | ICD-10-CM

## 2016-06-10 DIAGNOSIS — N6320 Unspecified lump in the left breast, unspecified quadrant: Secondary | ICD-10-CM | POA: Insufficient documentation

## 2016-06-17 ENCOUNTER — Ambulatory Visit
Admission: RE | Admit: 2016-06-17 | Discharge: 2016-06-17 | Disposition: A | Discharge status: Home / Self Care | Payer: PPO | Source: Ambulatory Visit | Attending: Internal Medicine | Admitting: Internal Medicine

## 2016-06-17 DIAGNOSIS — G5792 Unspecified mononeuropathy of left lower limb: Secondary | ICD-10-CM | POA: Insufficient documentation

## 2016-06-17 DIAGNOSIS — M8938 Hypertrophy of bone, other site: Secondary | ICD-10-CM | POA: Diagnosis not present

## 2016-06-17 DIAGNOSIS — G5791 Unspecified mononeuropathy of right lower limb: Secondary | ICD-10-CM | POA: Diagnosis not present

## 2016-06-17 DIAGNOSIS — M48061 Spinal stenosis, lumbar region without neurogenic claudication: Secondary | ICD-10-CM | POA: Diagnosis not present

## 2016-06-17 DIAGNOSIS — M5126 Other intervertebral disc displacement, lumbar region: Secondary | ICD-10-CM | POA: Diagnosis not present

## 2016-06-17 DIAGNOSIS — G5793 Unspecified mononeuropathy of bilateral lower limbs: Secondary | ICD-10-CM

## 2016-06-22 ENCOUNTER — Ambulatory Visit: Payer: PPO | Admitting: Podiatry

## 2016-07-19 ENCOUNTER — Emergency Department: Payer: PPO

## 2016-07-19 ENCOUNTER — Inpatient Hospital Stay
Admission: EM | Admit: 2016-07-19 | Discharge: 2016-07-21 | DRG: 193 | Disposition: A | Discharge status: Home / Self Care | Payer: PPO | Attending: Internal Medicine | Admitting: Internal Medicine

## 2016-07-19 DIAGNOSIS — I10 Essential (primary) hypertension: Secondary | ICD-10-CM | POA: Diagnosis present

## 2016-07-19 DIAGNOSIS — K219 Gastro-esophageal reflux disease without esophagitis: Secondary | ICD-10-CM | POA: Diagnosis not present

## 2016-07-19 DIAGNOSIS — F418 Other specified anxiety disorders: Secondary | ICD-10-CM | POA: Diagnosis not present

## 2016-07-19 DIAGNOSIS — Z886 Allergy status to analgesic agent status: Secondary | ICD-10-CM

## 2016-07-19 DIAGNOSIS — R197 Diarrhea, unspecified: Secondary | ICD-10-CM | POA: Diagnosis not present

## 2016-07-19 DIAGNOSIS — Z9181 History of falling: Secondary | ICD-10-CM

## 2016-07-19 DIAGNOSIS — T502X5A Adverse effect of carbonic-anhydrase inhibitors, benzothiadiazides and other diuretics, initial encounter: Secondary | ICD-10-CM | POA: Diagnosis not present

## 2016-07-19 DIAGNOSIS — R531 Weakness: Secondary | ICD-10-CM

## 2016-07-19 DIAGNOSIS — R110 Nausea: Secondary | ICD-10-CM | POA: Diagnosis not present

## 2016-07-19 DIAGNOSIS — J40 Bronchitis, not specified as acute or chronic: Secondary | ICD-10-CM

## 2016-07-19 DIAGNOSIS — J9601 Acute respiratory failure with hypoxia: Secondary | ICD-10-CM | POA: Diagnosis not present

## 2016-07-19 DIAGNOSIS — E860 Dehydration: Secondary | ICD-10-CM

## 2016-07-19 DIAGNOSIS — T501X5A Adverse effect of loop [high-ceiling] diuretics, initial encounter: Secondary | ICD-10-CM | POA: Diagnosis not present

## 2016-07-19 DIAGNOSIS — J101 Influenza due to other identified influenza virus with other respiratory manifestations: Principal | ICD-10-CM | POA: Diagnosis present

## 2016-07-19 DIAGNOSIS — S0990XA Unspecified injury of head, initial encounter: Secondary | ICD-10-CM | POA: Diagnosis not present

## 2016-07-19 DIAGNOSIS — E871 Hypo-osmolality and hyponatremia: Secondary | ICD-10-CM | POA: Diagnosis not present

## 2016-07-19 DIAGNOSIS — E785 Hyperlipidemia, unspecified: Secondary | ICD-10-CM | POA: Diagnosis present

## 2016-07-19 DIAGNOSIS — R42 Dizziness and giddiness: Secondary | ICD-10-CM | POA: Diagnosis not present

## 2016-07-19 DIAGNOSIS — A084 Viral intestinal infection, unspecified: Secondary | ICD-10-CM | POA: Diagnosis present

## 2016-07-19 DIAGNOSIS — M6281 Muscle weakness (generalized): Secondary | ICD-10-CM

## 2016-07-19 DIAGNOSIS — J111 Influenza due to unidentified influenza virus with other respiratory manifestations: Secondary | ICD-10-CM | POA: Diagnosis not present

## 2016-07-19 DIAGNOSIS — R0902 Hypoxemia: Secondary | ICD-10-CM

## 2016-07-19 DIAGNOSIS — R05 Cough: Secondary | ICD-10-CM | POA: Diagnosis not present

## 2016-07-19 DIAGNOSIS — R2681 Unsteadiness on feet: Secondary | ICD-10-CM

## 2016-07-19 DIAGNOSIS — K589 Irritable bowel syndrome without diarrhea: Secondary | ICD-10-CM | POA: Diagnosis present

## 2016-07-19 DIAGNOSIS — Y92039 Unspecified place in apartment as the place of occurrence of the external cause: Secondary | ICD-10-CM | POA: Diagnosis not present

## 2016-07-19 LAB — URINALYSIS, COMPLETE (UACMP) WITH MICROSCOPIC
Bilirubin Urine: NEGATIVE
Glucose, UA: NEGATIVE mg/dL
Hgb urine dipstick: NEGATIVE
Ketones, ur: NEGATIVE mg/dL
Leukocytes, UA: NEGATIVE
Nitrite: NEGATIVE
Protein, ur: NEGATIVE mg/dL
Specific Gravity, Urine: 1.012 (ref 1.005–1.030)
pH: 6 (ref 5.0–8.0)

## 2016-07-19 LAB — TROPONIN I: Troponin I: <0.03 ng/mL (ref <0.03)

## 2016-07-19 LAB — CBC
HCT: 43.3 % (ref 35.0–47.0)
Hemoglobin: 14.7 g/dL (ref 12.0–16.0)
MCH: 29.9 pg (ref 26.0–34.0)
MCHC: 33.9 g/dL (ref 32.0–36.0)
MCV: 88.2 fL (ref 80.0–100.0)
Platelets: 163 10*3/uL (ref 150–440)
RBC: 4.91 MIL/uL (ref 3.80–5.20)
RDW: 14.3 % (ref 11.5–14.5)
WBC: 6.1 10*3/uL (ref 3.6–11.0)

## 2016-07-19 LAB — BASIC METABOLIC PANEL
Anion gap: 10 (ref 5–15)
BUN: 16 mg/dL (ref 6–20)
CO2: 26 mmol/L (ref 22–32)
Calcium: 8.7 mg/dL — ABNORMAL LOW (ref 8.9–10.3)
Chloride: 97 mmol/L — ABNORMAL LOW (ref 101–111)
Creatinine, Ser: 1.24 mg/dL — ABNORMAL HIGH (ref 0.44–1.00)
GFR calc Af Amer: 47 mL/min — ABNORMAL LOW (ref >60)
GFR calc non Af Amer: 41 mL/min — ABNORMAL LOW (ref >60)
Glucose, Bld: 112 mg/dL — ABNORMAL HIGH (ref 65–99)
Potassium: 3.6 mmol/L (ref 3.5–5.1)
Sodium: 133 mmol/L — ABNORMAL LOW (ref 135–145)

## 2016-07-19 LAB — BRAIN NATRIURETIC PEPTIDE: B Natriuretic Peptide: 38 pg/mL (ref 0.0–100.0)

## 2016-07-19 LAB — INFLUENZA PANEL BY PCR (TYPE A & B)
Influenza A By PCR: POSITIVE — AB
Influenza B By PCR: NEGATIVE

## 2016-07-19 MED ORDER — ONDANSETRON HCL 4 MG PO TABS: 4.0000 mg | ORAL_TABLET | Freq: Four times a day (QID) | ORAL | Status: DC | PRN

## 2016-07-19 MED ORDER — SODIUM CHLORIDE 0.9 % IV SOLN
Freq: Once | INTRAVENOUS | Status: AC
  Administered 2016-07-19: 20:00:00 via INTRAVENOUS

## 2016-07-19 MED ORDER — DULOXETINE HCL 60 MG PO CPEP
60.0000 mg | ORAL_CAPSULE | Freq: Every day | ORAL | Status: DC
  Administered 2016-07-20 – 2016-07-21 (×2): 60 mg via ORAL
  Filled 2016-07-19 (×2): qty 1

## 2016-07-19 MED ORDER — PANTOPRAZOLE SODIUM 40 MG PO TBEC
40.0000 mg | DELAYED_RELEASE_TABLET | Freq: Every day | ORAL | Status: DC
Start: 2016-07-19 — End: 2016-07-21
  Administered 2016-07-19 – 2016-07-21 (×3): 40 mg via ORAL
  Filled 2016-07-19 (×3): qty 1

## 2016-07-19 MED ORDER — ONDANSETRON HCL 4 MG/2ML IJ SOLN: 4.0000 mg | Freq: Four times a day (QID) | INTRAMUSCULAR | Status: DC | PRN

## 2016-07-19 MED ORDER — HYDROCOD POLST-CPM POLST ER 10-8 MG/5ML PO SUER
5.0000 mL | Freq: Once | ORAL | Status: AC
  Administered 2016-07-19: 5 mL via ORAL
  Filled 2016-07-19: qty 5

## 2016-07-19 MED ORDER — ORAL CARE MOUTH RINSE
15.0000 mL | Freq: Two times a day (BID) | OROMUCOSAL | Status: DC
  Administered 2016-07-19 – 2016-07-20 (×3): 15 mL via OROMUCOSAL

## 2016-07-19 MED ORDER — ALBUTEROL SULFATE (2.5 MG/3ML) 0.083% IN NEBU: 2.5000 mg | INHALATION_SOLUTION | Freq: Four times a day (QID) | RESPIRATORY_TRACT | Status: DC | PRN

## 2016-07-19 MED ORDER — LEVOFLOXACIN 500 MG PO TABS: 500.0000 mg | ORAL_TABLET | Freq: Every day | ORAL | 0 refills | Status: DC

## 2016-07-19 MED ORDER — ENOXAPARIN SODIUM 40 MG/0.4ML ~~LOC~~ SOLN
40.0000 mg | SUBCUTANEOUS | Status: DC
  Administered 2016-07-19 – 2016-07-20 (×2): 40 mg via SUBCUTANEOUS
  Filled 2016-07-19 (×2): qty 0.4

## 2016-07-19 MED ORDER — OSELTAMIVIR PHOSPHATE 75 MG PO CAPS
75.0000 mg | ORAL_CAPSULE | Freq: Once | ORAL | Status: AC
  Administered 2016-07-19: 75 mg via ORAL
  Filled 2016-07-19 (×4): qty 1

## 2016-07-19 MED ORDER — ACETAMINOPHEN 650 MG RE SUPP: 650.0000 mg | Freq: Four times a day (QID) | RECTAL | Status: DC | PRN

## 2016-07-19 MED ORDER — HYDROCOD POLST-CPM POLST ER 10-8 MG/5ML PO SUER: 5.0000 mL | Freq: Two times a day (BID) | ORAL | 0 refills | Status: DC

## 2016-07-19 MED ORDER — ACETAMINOPHEN 325 MG PO TABS: 650.0000 mg | ORAL_TABLET | Freq: Four times a day (QID) | ORAL | Status: DC | PRN

## 2016-07-19 MED ORDER — SODIUM CHLORIDE 0.9 % IV SOLN
Freq: Once | INTRAVENOUS | Status: AC
  Administered 2016-07-19: 16:00:00 via INTRAVENOUS

## 2016-07-19 MED ORDER — VITAMIN C 500 MG PO TABS
1000.0000 mg | ORAL_TABLET | Freq: Every day | ORAL | Status: DC
  Administered 2016-07-19 – 2016-07-21 (×3): 1000 mg via ORAL
  Filled 2016-07-19 (×3): qty 2

## 2016-07-19 MED ORDER — TEMAZEPAM 15 MG PO CAPS: 30.0000 mg | ORAL_CAPSULE | Freq: Every evening | ORAL | Status: DC | PRN

## 2016-07-19 MED ORDER — ALBUTEROL SULFATE HFA 108 (90 BASE) MCG/ACT IN AERS: 1.0000 | INHALATION_SPRAY | Freq: Four times a day (QID) | RESPIRATORY_TRACT | Status: DC | PRN

## 2016-07-19 NOTE — ED Triage Notes (Signed)
Pt c/o cough with congestion for the past 2 weeks, states Friday she began having watery diarrhea and is having dizziness/lightheaded today, states she lost her balance and fell today. Denies injury from fall.

## 2016-07-19 NOTE — ED Provider Notes (Addendum)
[facility] Emergency Department Provider Note        Time seen: ----------------------------------------- 3:49 PM on 07/19/2016 -----------------------------------------    I have reviewed the triage vital signs and the nursing notes.   HISTORY  Chief Complaint Diarrhea; Dizziness; and Cough    HPI Ann Benson is a 75 y.o. female who presents to the ER for multiple complaints. Patient has a cough with congestion for last 2 weeks. She also has chills but denies fever. She has had some watery diarrhea occasionally, dizzy and lightheaded like she is off balance. States that she fell today which is unusual for her. She denies any injury from the fall. She denies chest pain or abdominal pain. Patient states her symptoms were worse when she got up from a sitting position   History reviewed. No pertinent past medical history.  There are no active problems to display for this patient.   Past Surgical History:  Procedure Laterality Date  . ABDOMINAL HYSTERECTOMY    . APPENDECTOMY    . BREAST BIOPSY Left 07/2013   neg  . CHOLECYSTECTOMY    . SHOULDER ARTHROSCOPY Left     Allergies Acetaminophen  Social History Social History  Substance Use Topics  . Smoking status: Never Smoker  . Smokeless tobacco: Never Used  . Alcohol use No    Review of Systems Constitutional: Negative for fever.Positive for chills Cardiovascular: Negative for chest pain. Respiratory: Negative for shortness of breath. Positive for cough Gastrointestinal: Negative for abdominal pain, vomiting. Positive for diarrhea Genitourinary: Negative for dysuria. Musculoskeletal: Negative for back pain. Skin: Negative for rash. Neurological: Negative for headaches, focal weakness or numbness. Positive for balance disturbance, weakness  10-point ROS otherwise negative.  ____________________________________________   PHYSICAL EXAM:  VITAL SIGNS: ED Triage Vitals [07/19/16  1408]  Enc Vitals Group     BP (!) 112/53     Pulse Rate 80     Resp 20     Temp 98.4 F (36.9 C)     Temp Source Oral     SpO2 97 %     Weight 182 lb (82.6 kg)     Height 5\' 4"  (1.626 m)     Head Circumference      Peak Flow      Pain Score      Pain Loc      Pain Edu?      Excl. in GC?     Constitutional: Alert and oriented. Well appearing and in no distress. Eyes: Conjunctivae are normal. PERRL. Normal extraocular movements. ENT   Head: Normocephalic and atraumatic.   Nose: No congestion/rhinnorhea.   Mouth/Throat: Mucous membranes are moist.   Neck: No stridor. Cardiovascular: Normal rate, regular rhythm. No murmurs, rubs, or gallops. Respiratory: Normal respiratory effort without tachypnea nor retractions. Breath sounds are clear and equal bilaterally. No wheezes/rales/rhonchi. Gastrointestinal: Soft and nontender. Normal bowel sounds Musculoskeletal: Nontender with normal range of motion in all extremities. No lower extremity tenderness nor edema. Neurologic:  Normal speech and language. No gross focal neurologic deficits are appreciated.  Skin:  Skin is warm, dry and intact. No rash noted. Psychiatric: Mood and affect are normal. Speech and behavior are normal.  ____________________________________________  EKG: Interpreted by me. Sinus rhythm rate of 74 bpm, normal PR interval, normal QRS, normal QT, normal axis. T-wave inversions anterior laterally.  ____________________________________________  ED COURSE:  Pertinent labs & imaging results that were available during my care of the patient were reviewed by me and considered  in my medical decision making (see chart for details). Clinical Course as of Jul 19 1718  Wynelle LinkSun Jul 19, 2016  1707 Patient was unable to ambulate due to feelings of dizziness and weakness and noted to be hypoxic with ambulation. I will obtain CT angiogram.  [JW]  1715 Patient's O2 saturations dropped to 82% with ambulation  [JW]     Clinical Course User Index [JW] Emily FilbertJonathan E Williams, MD  Patient presents to the ER in no distress but with multiple complaints. We will assess with labs, obtain chest x-ray and head CT.  Procedures ____________________________________________   LABS (pertinent positives/negatives)  Labs Reviewed  BASIC METABOLIC PANEL - Abnormal; Notable for the following:       Result Value   Sodium 133 (*)    Chloride 97 (*)    Glucose, Bld 112 (*)    Creatinine, Ser 1.24 (*)    Calcium 8.7 (*)    GFR calc non Af Amer 41 (*)    GFR calc Af Amer 47 (*)    All other components within normal limits  URINALYSIS, COMPLETE (UACMP) WITH MICROSCOPIC - Abnormal; Notable for the following:    Color, Urine YELLOW (*)    APPearance CLEAR (*)    Bacteria, UA RARE (*)    Squamous Epithelial / LPF 0-5 (*)    All other components within normal limits  INFLUENZA PANEL BY PCR (TYPE A & B, H1N1) - Abnormal; Notable for the following:    Influenza A By PCR POSITIVE (*)    All other components within normal limits  CBC  BRAIN NATRIURETIC PEPTIDE  TROPONIN I  CBG MONITORING, ED    RADIOLOGY Images were viewed by me  Chest x-ray, CT head Are unremarkable ____________________________________________  FINAL ASSESSMENT AND PLAN  Weakness, bronchitis, diarrhea, Hypoxia, Influenza  Plan: Patient with labs and imaging as dictated above. Patient's in no distress, workup likely reflective of dehydration and viral illness. Patient was unable to ambulate due to weakness and hypoxia which are likely influenza related. She'll be started on Tamiflu, given supplemental O2 at all discussed with the hospitalist for admission.   Emily FilbertWilliams, Jonathan E, MD   Note: This dictation was prepared with Dragon dictation. Any transcriptional errors that result from this process are unintentional    Emily FilbertJonathan E Williams, MD 07/19/16 1658    Emily FilbertJonathan E Williams, MD 07/19/16 [id number]

## 2016-07-19 NOTE — ED Notes (Signed)
Pt states diarrhea x too many times yest, liquid but normal brown color. States nausea but no vomiting, chest congestion. Denies vomiting or fever. Pt states yest she fell, missed hitting table so she told this RN she didn't hit anything. PT states she feels off balance and dizzy. Pt is alert and oriented, talking in complete sentences. Family at bedside.

## 2016-07-19 NOTE — ED Notes (Signed)
Orthostatic vitals Laying BP 127/66 Pulse 72 Sitting BP 123/65 Pulse 76 Standing 120/63 Pulse 72

## 2016-07-19 NOTE — ED Notes (Signed)
Called pharmacy to send tamiflu

## 2016-07-19 NOTE — H&P (Signed)
[facility] [place] at [facility]   PATIENT NAME: Ann Benson    MR#:  [id number]  DATE OF BIRTH:  1940/10/04  DATE OF ADMISSION:  07/19/2016  PRIMARY CARE PHYSICIAN: Curtis SitesBERT J KLEIN III, MD   REQUESTING/REFERRING PHYSICIAN: Dr. Mayford KnifeWilliams  CHIEF COMPLAINT:  Cough fever and congestion for 2 weeks  HISTORY OF PRESENT ILLNESS:  Ann Scarletlizabeth Olivencia  is a 75 y.o. female with a known history of Hypertension, anxiety, depression, GERD, IBS comes to the emergency room with increasing cough congestion and shortness of breath for 2 weeks. Patient also had a fever of 100-100.1 at home. She was found to have influenza A positive. Chest x-ray negative for pneumonia. White count normal. Patient received IV fluids was started on Tamiflu. She had a near-syncopal episode and felt very weak and dizzy she fell today. Denies any injury. She's also been having runny/watery stools for 2 days  PAST MEDICAL HISTORY:  Hypertension Hyperlipidemia Depression/anxiety GERD with heartburn hernia Hyperlipidemia IBS  PAST SURGICAL HISTOIRY:   Past Surgical History:  Procedure Laterality Date  . ABDOMINAL HYSTERECTOMY    . APPENDECTOMY    . BREAST BIOPSY Left 07/2013   neg  . CHOLECYSTECTOMY    . SHOULDER ARTHROSCOPY Left     SOCIAL HISTORY:   Social History  Substance Use Topics  . Smoking status: Never Smoker  . Smokeless tobacco: Never Used  . Alcohol use No    FAMILY HISTORY:   Family History  Problem Relation Age of Onset  . Breast cancer Neg Hx     DRUG ALLERGIES:   Allergies  Allergen Reactions  . Acetaminophen Palpitations    REVIEW OF SYSTEMS:  Review of Systems  Constitutional: Positive for fever and malaise/fatigue. Negative for chills and weight loss.  HENT: Negative for ear discharge, ear pain and nosebleeds.   Eyes: Negative for blurred vision, pain and discharge.  Respiratory: Positive for shortness of breath. Negative for sputum production,  wheezing and stridor.   Cardiovascular: Negative for chest pain, palpitations, orthopnea and PND.  Gastrointestinal: Negative for abdominal pain, diarrhea, nausea and vomiting.  Genitourinary: Negative for frequency and urgency.  Musculoskeletal: Negative for back pain and joint pain.  Neurological: Positive for weakness. Negative for sensory change, speech change and focal weakness.  Psychiatric/Behavioral: Negative for depression and hallucinations. The patient is not nervous/anxious.      MEDICATIONS AT HOME:   Prior to Admission medications   Medication Sig Start Date End Date Taking? Authorizing Provider  albuterol (PROAIR HFA) 108 (90 Base) MCG/ACT inhaler Inhale into the lungs. 04/11/15   Historical Provider, MD  Ascorbic Acid (VITAMIN C) 1000 MG tablet Take by mouth.    Historical Provider, MD  baclofen (LIORESAL) 10 MG tablet Take 5 mg by mouth. 05/18/13   Historical Provider, MD  chlorpheniramine-HYDROcodone (TUSSIONEX PENNKINETIC ER) 10-8 MG/5ML SUER Take 5 mLs by mouth 2 (two) times daily. 07/19/16   Emily FilbertJonathan E Williams, MD  Cholecalciferol (VITAMIN D3) 2000 units capsule Take by mouth.    Historical Provider, MD  DULoxetine (CYMBALTA) 60 MG capsule TAKE 1 CAPSULE (60 MG TOTAL) BY MOUTH ONCE DAILY. 07/22/15   Historical Provider, MD  furosemide (LASIX) 20 MG tablet Take by mouth. 09/18/15 09/17/16  Historical Provider, MD  levofloxacin (LEVAQUIN) 500 MG tablet Take 1 tablet (500 mg total) by mouth daily. 07/19/16 07/29/16  Emily FilbertJonathan E Williams, MD  pantoprazole (PROTONIX) 40 MG tablet Take by mouth. 04/11/15   Historical Provider, MD  potassium chloride (K-DUR,KLOR-CON)  10 MEQ tablet Take by mouth. 09/18/15 09/17/16  Historical Provider, MD  temazepam (RESTORIL) 30 MG capsule Take by mouth. 04/11/15   Historical Provider, MD  triamterene-hydrochlorothiazide (MAXZIDE-25) 37.5-25 MG tablet Take by mouth. 07/17/15   Historical Provider, MD      VITAL SIGNS:  Blood pressure 118/69, pulse 72,  temperature 98.4 F (36.9 C), temperature source Oral, resp. rate 20, height 5\' 4"  (1.626 m), weight 82.6 kg (182 lb), SpO2 98 %.  PHYSICAL EXAMINATION:  GENERAL:  75 y.o.-year-old patient lying in the bed with no acute distress.  EYES: Pupils equal, round, reactive to light and accommodation. No scleral icterus. Extraocular muscles intact.  HEENT: Head atraumatic, normocephalic. Oropharynx and nasopharynx clear.  NECK:  Supple, no jugular venous distention. No thyroid enlargement, no tenderness.  LUNGS: Normal breath sounds bilaterally, no wheezing, rales,rhonchi or crepitation. No use of accessory muscles of respiration.  CARDIOVASCULAR: S1, S2 normal. No murmurs, rubs, or gallops.  ABDOMEN: Soft, nontender, nondistended. Bowel sounds present. No organomegaly or mass.  EXTREMITIES: No pedal edema, cyanosis, or clubbing.  NEUROLOGIC: Cranial nerves II through XII are intact. Muscle strength 5/5 in all extremities. Sensation intact. Gait not checked.  PSYCHIATRIC: The patient is alert and oriented x 3.  SKIN: No obvious rash, lesion, or ulcer.   LABORATORY PANEL:   CBC  Recent Labs Lab 07/19/16 1413  WBC 6.1  HGB 14.7  HCT 43.3  PLT 163   ------------------------------------------------------------------------------------------------------------------  Chemistries   Recent Labs Lab 07/19/16 1413  NA 133*  K 3.6  CL 97*  CO2 26  GLUCOSE 112*  BUN 16  CREATININE 1.24*  CALCIUM 8.7*   ------------------------------------------------------------------------------------------------------------------  Cardiac Enzymes  Recent Labs Lab 07/19/16 1413  TROPONINI <0.03   ------------------------------------------------------------------------------------------------------------------  RADIOLOGY:  Dg Chest 2 View  Result Date: 07/19/2016 CLINICAL DATA:  Cough and congestion for 2 weeks, diarrhea yesterday, nausea without vomiting, fell yesterday, imbalance and  dizziness, weakness EXAM: CHEST  2 VIEW COMPARISON:  12/30/2013 FINDINGS: Borderline enlargement of cardiac silhouette. Mediastinal contours and pulmonary vascularity normal. Bronchitic changes without pulmonary infiltrate, pleural effusion or pneumothorax. Bones diffusely demineralized. Prior spinal augmentation procedure at approximately L1. IMPRESSION: Bronchitic changes without acute infiltrate. Electronically Signed   By: Ulyses Southward M.D.   On: 07/19/2016 15:57   Ct Head Wo Contrast  Result Date: 07/19/2016 CLINICAL DATA:  Increased weakness over last few days, fall, imbalance, dizziness EXAM: CT HEAD WITHOUT CONTRAST TECHNIQUE: Contiguous axial images were obtained from the base of the skull through the vertex without intravenous contrast. COMPARISON:  08/21/2008 FINDINGS: Brain: Age-related atrophy. Normal ventricular morphology. No midline shift or mass effect. Otherwise normal appearance of brain parenchyma. No intracranial hemorrhage, mass lesion, evidence acute infarction or extra-axial fluid collection. Vascular: Unremarkable Skull: Intact Sinuses/Orbits: Clear Other: N/A IMPRESSION: No acute intracranial abnormalities. Electronically Signed   By: Ulyses Southward M.D.   On: 07/19/2016 16:01    EKG:  NSR  IMPRESSION AND PLAN:   Shirla Hodgkiss  is a 75 y.o. female with a known history of Hypertension, anxiety, depression, GERD, IBS comes to the emergency room with increasing cough congestion and shortness of breath for 2 weeks. Patient also had a fever of 100-100.1 at home. She was found to have influenza A positive. Chest x-ray negative for pneumonia. White count normal.  1. Acute hypoxic respiratory failure secondary to influenza A -Patient uses pro-air inhaler at home however does not have any history of asthma or COPD -Chest x-ray shows some hyperinflation. No  pneumonia. Holding off antibiotic at present -White count normal -When necessary nebs When necessary Tylenol IV fluids  2.  Influenza A -Started on Tamiflu -Symptomatic management  3. Hyponatremia suspect due to diuretics/hydro-chlorothiazide/poor by mouth intake -Hold off Lasix and hydrochlorothiazide -IV fluids  4. Hyperlipidemia continue statin  5. Anxiety depression continue home meds  DVT prophylaxis subcutaneous Lovenox   All the records are reviewed and case discussed with ED provider. Management plans discussed with the patient, family and they are in agreement.  CODE STATUS: full  TOTAL TIME TAKING CARE OF THIS PATIENT: 50 minutes.    PATEL,SONA M.D on 07/19/2016 at 5:46 PM  Between 7am to 6pm - Pager - [phone]  After 6pm go to [email or web address] - password [email or web address]  [facility]  Office  [phone]  CC: Primary care physician; Lynnea FerrierBERT J KLEIN III, MD

## 2016-07-19 NOTE — ED Notes (Signed)
Pt has been on O2 via n/c at 2L - removed from O2 and ambulated approx 50-70 steps - pt became short of breath and dizzy - desat'd from 100% to 83% - sat pt down in chair and after a few minutes O2 sat increased to 99% - pt ambulated back to room - Dr Mayford KnifeWilliams notified

## 2016-07-19 NOTE — ED Notes (Signed)
Pt transported to room 142 

## 2016-07-20 DIAGNOSIS — E871 Hypo-osmolality and hyponatremia: Secondary | ICD-10-CM | POA: Diagnosis not present

## 2016-07-20 DIAGNOSIS — R197 Diarrhea, unspecified: Secondary | ICD-10-CM | POA: Diagnosis not present

## 2016-07-20 DIAGNOSIS — J101 Influenza due to other identified influenza virus with other respiratory manifestations: Secondary | ICD-10-CM | POA: Diagnosis not present

## 2016-07-20 DIAGNOSIS — J9601 Acute respiratory failure with hypoxia: Secondary | ICD-10-CM | POA: Diagnosis not present

## 2016-07-20 MED ORDER — OSELTAMIVIR PHOSPHATE 30 MG PO CAPS
30.0000 mg | ORAL_CAPSULE | Freq: Two times a day (BID) | ORAL | Status: DC
  Administered 2016-07-20 – 2016-07-21 (×2): 30 mg via ORAL
  Filled 2016-07-20 (×3): qty 1

## 2016-07-20 MED ORDER — GUAIFENESIN 100 MG/5ML PO SOLN
10.0000 mL | Freq: Four times a day (QID) | ORAL | Status: DC | PRN
  Administered 2016-07-20: 200 mg via ORAL
  Filled 2016-07-20 (×3): qty 10

## 2016-07-20 NOTE — Progress Notes (Signed)
Pt has been c/o cough all day. No PRN cough medicine ordered as of this time. Paged oncall MD Enedina FinnerSona Patel and ordered to give Robitussin 10ml every 6hrs PRN. Will administer as ordered.

## 2016-07-20 NOTE — Progress Notes (Signed)
On assessment patient alert and oriented X4. Patient has expiratory wheezing and complaining of pain when deep breathing. Patient stated she has been very weak and fell out of the bed at home. She is also complaining of pain and tingling in bilateral lower extremities that she has an appointment to see doctor abou in January. Patient was able to be weaned off of O2 and is sating 95% on room air while ambulating. MD aware. Patient has PRN nebs in case of SOB. Patient says she uses inhaler at home but does not know what kind. Patient husband educated on droplet precautions and was taught to wear mask in room and wash hands when entering and exiting the room.   Harvie HeckMelanie Womble, RN   Harvie HeckMelanie Womble, RN

## 2016-07-20 NOTE — Consult Note (Signed)
   [facility]   07/20/2016  Ann Benson Oct 09, 1940 [id number]   Patient screened for potential [facility] Care Management services. Patient is eligible for [facility] Care Management Services. Electronic medical record reveals there were no identifiable [facility] care [facility] needs. [facility] Care Management services not appropriate at this time. If patient's post hospital needs change please place a [facility] Care [facility] consult. For questions please contact:   Janci Minor RN, BSN [facility]  Hospital Liaison  [phone]) Business Mobile [phone]) Toll free office

## 2016-07-20 NOTE — Progress Notes (Signed)
[facility] - [place] at [facility]   PATIENT NAME: Ann Benson    MR#:  [id number]  DATE OF BIRTH:  Jul 19, 1941  SUBJECTIVE:  CHIEF COMPLAINT:   Chief Complaint  Patient presents with  . Diarrhea  . Dizziness  . Cough     Fatigue and cough for few days, has episode of fall. Found positive for flu.  REVIEW OF SYSTEMS:  CONSTITUTIONAL: No fever,positive for fatigue or weakness.  EYES: No blurred or double vision.  EARS, NOSE, AND THROAT: No tinnitus or ear pain.  RESPIRATORY: No cough, shortness of breath, wheezing or hemoptysis.  CARDIOVASCULAR: No chest pain, orthopnea, edema.  GASTROINTESTINAL: No nausea, vomiting, diarrhea or abdominal pain.  GENITOURINARY: No dysuria, hematuria.  ENDOCRINE: No polyuria, nocturia,  HEMATOLOGY: No anemia, easy bruising or bleeding SKIN: No rash or lesion. MUSCULOSKELETAL: No joint pain or arthritis.   NEUROLOGIC: No tingling, numbness, weakness.  PSYCHIATRY: No anxiety or depression.   ROS  DRUG ALLERGIES:   Allergies  Allergen Reactions  . Acetaminophen Palpitations    VITALS:  Blood pressure (!) 108/51, pulse 66, temperature 98.5 F (36.9 C), temperature source Oral, resp. rate 18, height 5\' 4"  (1.626 m), weight 83 kg (183 lb), SpO2 98 %.  PHYSICAL EXAMINATION:   GENERAL:  75 y.o.-year-old patient lying in the bed with no acute distress.  EYES: Pupils equal, round, reactive to light and accommodation. No scleral icterus. Extraocular muscles intact.  HEENT: Head atraumatic, normocephalic. Oropharynx and nasopharynx clear.  NECK:  Supple, no jugular venous distention. No thyroid enlargement, no tenderness.  LUNGS: Normal breath sounds bilaterally, no wheezing, rales,rhonchi or crepitation. No use of accessory muscles of respiration.  CARDIOVASCULAR: S1, S2 normal. No murmurs, rubs, or gallops.  ABDOMEN: Soft, nontender, nondistended. Bowel sounds present. No organomegaly or mass.  EXTREMITIES: No pedal  edema, cyanosis, or clubbing.  NEUROLOGIC: Cranial nerves II through XII are intact. Muscle strength 5/5 in all extremities. Sensation intact. Gait not checked.  PSYCHIATRIC: The patient is alert and oriented x 3.  SKIN: No obvious rash, lesion, or ulcer.   Physical Exam LABORATORY PANEL:   CBC  Recent Labs Lab 07/19/16 1413  WBC 6.1  HGB 14.7  HCT 43.3  PLT 163   ------------------------------------------------------------------------------------------------------------------  Chemistries   Recent Labs Lab 07/19/16 1413  NA 133*  K 3.6  CL 97*  CO2 26  GLUCOSE 112*  BUN 16  CREATININE 1.24*  CALCIUM 8.7*   ------------------------------------------------------------------------------------------------------------------  Cardiac Enzymes  Recent Labs Lab 07/19/16 1413  TROPONINI <0.03   ------------------------------------------------------------------------------------------------------------------  RADIOLOGY:  Dg Chest 2 View  Result Date: 07/19/2016 CLINICAL DATA:  Cough and congestion for 2 weeks, diarrhea yesterday, nausea without vomiting, fell yesterday, imbalance and dizziness, weakness EXAM: CHEST  2 VIEW COMPARISON:  12/30/2013 FINDINGS: Borderline enlargement of cardiac silhouette. Mediastinal contours and pulmonary vascularity normal. Bronchitic changes without pulmonary infiltrate, pleural effusion or pneumothorax. Bones diffusely demineralized. Prior spinal augmentation procedure at approximately L1. IMPRESSION: Bronchitic changes without acute infiltrate. Electronically Signed   By: Ulyses SouthwardMark  Boles M.D.   On: 07/19/2016 15:57   Ct Head Wo Contrast  Result Date: 07/19/2016 CLINICAL DATA:  Increased weakness over last few days, fall, imbalance, dizziness EXAM: CT HEAD WITHOUT CONTRAST TECHNIQUE: Contiguous axial images were obtained from the base of the skull through the vertex without intravenous contrast. COMPARISON:  08/21/2008 FINDINGS: Brain:  Age-related atrophy. Normal ventricular morphology. No midline shift or mass effect. Otherwise normal appearance of brain parenchyma. No intracranial hemorrhage, mass  lesion, evidence acute infarction or extra-axial fluid collection. Vascular: Unremarkable Skull: Intact Sinuses/Orbits: Clear Other: N/A IMPRESSION: No acute intracranial abnormalities. Electronically Signed   By: Ulyses SouthwardMark  Boles M.D.   On: 07/19/2016 16:01    ASSESSMENT AND PLAN:   Active Problems:   Acute respiratory failure with hypoxia (HCC)  Ann Benson  is a 75 y.o. female with a known history of Hypertension, anxiety, depression, GERD, IBS comes to the emergency room with increasing cough congestion and shortness of breath for 2 weeks. Patient also had a fever of 100-100.1 at home. She was found to have influenza A positive. Chest x-ray negative for pneumonia. White count normal.  1. Acute hypoxic respiratory failure secondary to influenza A -Patient uses pro-air inhaler at home however does not have any history of asthma or COPD -Chest x-ray shows some hyperinflation. No pneumonia. Holding off antibiotic at present -White count normal -When necessary nebs When necessary Tylenol IV fluids  2. Influenza A -Started on Tamiflu -Symptomatic management - may need pT eval  3. Hyponatremia suspect due to diuretics/hydro-chlorothiazide/poor by mouth intake -Hold off Lasix and hydrochlorothiazide -IV fluids  4. Hyperlipidemia continue statin  5. Anxiety depression continue home meds   All the records are reviewed and case discussed with Care Management/Social Workerr. Management plans discussed with the patient, family and they are in agreement.  CODE STATUS: full.  TOTAL TIME TAKING CARE OF THIS PATIENT: 35 minutes.     POSSIBLE D/C IN 1-2 DAYS, DEPENDING ON CLINICAL CONDITION.   Altamese DillingVACHHANI, VAIBHAVKUMAR M.D on 07/20/2016   Between 7am to 6pm - Pager - [phone]  After 6pm go to [email or web address] -  password [facility] [facility]  [facility]  Office  [phone]  CC: Primary care physician; Lynnea FerrierBERT J KLEIN III, MD  Note: This dictation was prepared with Dragon dictation along with smaller phrase technology. Any transcriptional errors that result from this process are unintentional.

## 2016-07-20 NOTE — Care Management Note (Signed)
Case Management Note  Patient Details  Name: KAELEN BRENNAN MRN: [id number] Date of Birth: 1941-03-18  Subjective/Objective:                   Met with patient and her husband to discuss discharge planning. Patient is independent from home and was able to drive. She states that since recent flu she cannot maintain her balance when walking. She is currently sitting without difficulty in recliner with husband at her side. Her PCP is Dr. Ernst Spell. She uses [facility] for medications that she denies having problem obtaining. She does have a rolling walker that belonged to her mother. She does not have a nebulizer at home or home O2.  Action/Plan: Home health list left for them to review.   Expected Discharge Date:                  Expected Discharge Plan:     In-House Referral:     Discharge planning Services  CM Consult  Post Acute Care Choice:  Durable Medical Equipment, Home Health Choice offered to:  Patient, Spouse  DME Arranged:    DME Agency:     HH Arranged:    [place] Agency:     Status of Service:  In process, will continue to follow  If discussed at Long Length of Stay Meetings, dates discussed:    Additional Comments:  Marshell Garfinkel, RN 07/20/2016, 12:55 PM

## 2016-07-20 NOTE — Evaluation (Signed)
Physical Therapy Evaluation Patient Details Name: Ann Benson MRN: [id number] DOB: February 20, 1941 Today's Date: 07/20/2016   History of Present Illness  Ann Benson is a 75 y.o. female with a known history of Hypertension, anxiety, depression, GERD, IBS comes to the emergency room with increasing cough congestion and shortness of breath for 2 weeks. Patient also had a fever of 100-100.1 at home. She was found to have influenza A positive. Chest x-ray negative for pneumonia. White count normal. Patient received IV fluids was started on Tamiflu. She had a near-syncopal episode and felt very weak and dizzy she fell today. Denies any injury. She's also been having runny/watery stools for 2 days  Clinical Impression  Pt admitted with above diagnosis. Pt currently with functional limitations due to the deficits listed below (see PT Problem List).  Pt is acutely deconditioned due to current illness. She is able to perform bed mobility at modified independent but requires CGA for transfers and ambulation due to LE weakness and fatigue. Pt reports DOE during very limited ambulation. Her vital signs are stable on room air with SaO2>95% on room air however pt requires multiple standing rest breaks due to fatigue and SOB. She reports recent fall due to LE weakness. Recommend she utilize a rolling walker at discharge to help with safety and fall risk reduction. Pt already owns a rolling walker. [facility] to pre-morbid strength and safety. Pt will benefit from skilled PT services to address deficits in strength, balance, and mobility in order to return to full function at home.     Follow Up Recommendations Home health PT    Equipment Recommendations  None recommended by PT    Recommendations for Other Services       Precautions / Restrictions Precautions Precautions: Fall Restrictions Weight Bearing Restrictions: No      Mobility  Bed Mobility Overal bed mobility:  Modified Independent             General bed mobility comments: Pt able to come up to sitting at EOB with use of bed rails and HOB elevated approximately 20 degrees. No external assist required by therapist but slight increase in time required  Transfers Overall transfer level: Needs assistance Equipment used: Rolling walker (2 wheeled) Transfers: Sit to/from Stand Sit to Stand: Min guard         General transfer comment: Pt demonstrates safe hand placement during transfer. Good anterior weight shifting and good stability once upright in standing with UE support on rolling walker. Slight increase in time required to come to standing  Ambulation/Gait Ambulation/Gait assistance: Min guard Ambulation Distance (Feet): 120 Feet Assistive device: Rolling walker (2 wheeled) Gait Pattern/deviations: Decreased step length - right;Decreased step length - left Gait velocity: Decreased Gait velocity interpretation: <1.8 ft/sec, indicative of risk for recurrent falls General Gait Details: Pt ambulates with slow and labored gait. Decreased speed and decreased step length noted with DOE reported. VSS during ambulation with HR around 85 bpm and SaO2 remaining at or above 95% on room air. Pt requires multiple standing rest breaks due to fatigue and extended time to complete walk. Pt provided education about how to utilize walker safely. Instruction to remain within confines of walker and for upright posture  Stairs            Wheelchair Mobility    Modified Rankin (Stroke Patients Only)       Balance Overall balance assessment: Needs assistance Sitting-balance support: No upper extremity supported Sitting  balance-Leahy Scale: Good     Standing balance support: No upper extremity supported Standing balance-Leahy Scale: Fair Standing balance comment: Pt demonstrates fair balance in standing with feet apart without UE support. Able to place meet together and maintain but with eyes  closed demonstrates increased sway. However no LOB. single leg balance is approximately 1-2 seconds on each LE                             Pertinent Vitals/Pain Pain Assessment: No/denies pain    Home Living Family/patient expects to be discharged to:: Private residence Living Arrangements: Spouse/significant other Available Help at Discharge: Family Type of Home: Apartment Home Access: Level entry     Home Layout: One level Home Equipment: Environmental consultantWalker - 2 wheels;Grab bars - tub/shower      Prior Function Level of Independence: Independent         Comments: Full community ambulator without assistive device. Independent with ADLs/IADLs     Hand Dominance   Dominant Hand: Right    Extremity/Trunk Assessment   Upper Extremity Assessment: Generalized weakness           Lower Extremity Assessment: Generalized weakness         Communication   Communication: No difficulties  Cognition Arousal/Alertness: Awake/alert Behavior During Therapy: WFL for tasks assessed/performed Overall Cognitive Status: Within Functional Limits for tasks assessed                      General Comments      Exercises     Assessment/Plan    PT Assessment Patient needs continued PT services  PT Problem List Decreased strength;Decreased activity tolerance;Decreased balance;Cardiopulmonary status limiting activity          PT Treatment Interventions DME instruction;Gait training;Therapeutic activities;Therapeutic exercise;Balance training;Neuromuscular re-education;Patient/family education    PT Goals (Current goals can be found in the Care Plan section)  Acute Rehab PT Goals Patient Stated Goal: Return to prior level of function at home PT Goal Formulation: With patient/family Time For Goal Achievement: 08/03/16 Potential to Achieve Goals: Good    Frequency Min 2X/week   Barriers to discharge        Co-evaluation               End of Session  Equipment Utilized During Treatment: Gait belt Activity Tolerance: Patient limited by fatigue Patient left: in chair;with call bell/phone within reach;with chair alarm set Nurse Communication: Mobility status         Time: [id number] PT Time Calculation (min) (ACUTE ONLY): 29 min   Charges:   PT Evaluation $PT Eval Low Complexity: 1 Procedure PT Treatments $Gait Training: 8-22 mins   PT G Codes:       Sharalyn InkJason D Huprich PT, DPT   Huprich,Jason 07/20/2016, 11:17 AM

## 2016-07-21 DIAGNOSIS — J101 Influenza due to other identified influenza virus with other respiratory manifestations: Secondary | ICD-10-CM | POA: Diagnosis not present

## 2016-07-21 DIAGNOSIS — E871 Hypo-osmolality and hyponatremia: Secondary | ICD-10-CM | POA: Diagnosis not present

## 2016-07-21 DIAGNOSIS — J9601 Acute respiratory failure with hypoxia: Secondary | ICD-10-CM | POA: Diagnosis not present

## 2016-07-21 DIAGNOSIS — R197 Diarrhea, unspecified: Secondary | ICD-10-CM | POA: Diagnosis not present

## 2016-07-21 MED ORDER — GUAIFENESIN 100 MG/5ML PO SOLN: 10.0000 mL | Freq: Four times a day (QID) | ORAL | 0 refills | Status: DC | PRN

## 2016-07-21 MED ORDER — OSELTAMIVIR PHOSPHATE 30 MG PO CAPS: 30.0000 mg | ORAL_CAPSULE | Freq: Two times a day (BID) | ORAL | 0 refills | Status: DC

## 2016-07-21 NOTE — Discharge Summary (Addendum)
[facility] [place] [facility]   PATIENT NAME: Ann Benson    MR#:  [id number]  DATE OF BIRTH:  12-19-1940  DATE OF ADMISSION:  07/19/2016 ADMITTING PHYSICIAN: Enedina FinnerSona Patel, MD  DATE OF DISCHARGE: 07/21/2016  PRIMARY CARE PHYSICIAN: Curtis SitesBERT J KLEIN III, MD    ADMISSION DIAGNOSIS:  Dehydration [E86.0] Weakness [R53.1] Bronchitis [J40] Influenza A [J10.1] Hypoxia [R09.02] Diarrhea, unspecified type [R19.7]  DISCHARGE DIAGNOSIS:  Active Problems:   Acute respiratory failure with hypoxia (HCC)   Influenza   SECONDARY DIAGNOSIS:  History reviewed. No pertinent past medical history.  HOSPITAL COURSE:   Ann Curlinglizabeth Oldhamis a 75 y.o. femalewith a known history of Hypertension, anxiety, depression, GERD, IBS comes to the emergency room with increasing cough congestion and shortness of breath for 2 weeks. Patient also had a fever of 100-100.1 at home. She was found to have influenza A positive. Chest x-ray negative for pneumonia. White count normal.  1. Acute hypoxic respiratory failure secondary to influenza A -Patient uses pro-air inhaler at home however does not have any history of asthma or COPD -Chest x-ray shows some hyperinflation. No pneumonia. Holding off antibiotic at present -White count normal -When necessary nebs When necessary Tylenol IV fluids  pt felt better, still weak but recovering- d/c home.  2. Influenza A -Started on Tamiflu -Symptomatic management - PT suggested [facility] and PT- but pt refuses services, she have a walker at home and she don't want any other services.  3. Hyponatremia suspect due to diuretics/hydro-chlorothiazide/poor by mouth intake -Hold off Lasix and hydrochlorothiazide -IV fluids  as pt seems some dehydrated on admission, hold diuretics on discharge.  4. Hyperlipidemia continue statin  5. Anxiety depression continue home meds  6. Diarrhea and nausea- likely viral.     Resolving now.  DISCHARGE  CONDITIONS:   Stable.  CONSULTS OBTAINED:    DRUG ALLERGIES:   Allergies  Allergen Reactions  . Acetaminophen Palpitations    DISCHARGE MEDICATIONS:   Current Discharge Medication List    START taking these medications   Details  guaiFENesin (ROBITUSSIN) 100 MG/5ML SOLN Take 10 mLs (200 mg total) by mouth every 6 (six) hours as needed for cough or to loosen phlegm. Qty: 1200 mL, Refills: 0    oseltamivir (TAMIFLU) 30 MG capsule Take 1 capsule (30 mg total) by mouth 2 (two) times daily. Qty: 8 capsule, Refills: 0      CONTINUE these medications which have NOT CHANGED   Details  albuterol (PROAIR HFA) 108 (90 Base) MCG/ACT inhaler Inhale into the lungs.    Ascorbic Acid (VITAMIN C) 1000 MG tablet Take by mouth.    Cholecalciferol (VITAMIN D3) 2000 units capsule Take by mouth.    DULoxetine (CYMBALTA) 60 MG capsule TAKE 1 CAPSULE (60 MG TOTAL) BY MOUTH ONCE DAILY. Refills: 4    pantoprazole (PROTONIX) 40 MG tablet Take by mouth.    temazepam (RESTORIL) 30 MG capsule Take by mouth.    baclofen (LIORESAL) 10 MG tablet Take 5 mg by mouth.      STOP taking these medications     furosemide (LASIX) 20 MG tablet      potassium chloride (K-DUR,KLOR-CON) 10 MEQ tablet      triamterene-hydrochlorothiazide (MAXZIDE-25) 37.5-25 MG tablet          DISCHARGE INSTRUCTIONS:    Follow with PMD in 1 week.  If you experience worsening of your admission symptoms, develop shortness of breath, life threatening emergency, suicidal or homicidal thoughts you must seek  medical attention immediately by calling 911 or calling your MD immediately  if symptoms less severe.  You Must read complete instructions/literature along with all the possible adverse reactions/side effects for all the Medicines you take and that have been prescribed to you. Take any new Medicines after you have completely understood and accept all the possible adverse reactions/side effects.   Please note  You  were cared for by a hospitalist during your hospital stay. If you have any questions about your discharge medications or the care you received while you were in the hospital after you are discharged, you can call the unit and asked to speak with the hospitalist on call if the hospitalist that took care of you is not available. Once you are discharged, your primary care physician will handle any further medical issues. Please note that NO REFILLS for any discharge medications will be authorized once you are discharged, as it is imperative that you return to your primary care physician (or establish a relationship with a primary care physician if you do not have one) for your aftercare needs so that they can reassess your need for medications and monitor your lab values.    Today   CHIEF COMPLAINT:   Chief Complaint  Patient presents with  . Diarrhea  . Dizziness  . Cough    HISTORY OF PRESENT ILLNESS:  Ann Benson  is a 75 y.o. female with a known history of Hypertension, anxiety, depression, GERD, IBS comes to the emergency room with increasing cough congestion and shortness of breath for 2 weeks. Patient also had a fever of 100-100.1 at home. She was found to have influenza A positive. Chest x-ray negative for pneumonia. White count normal. Patient received IV fluids was started on Tamiflu. She had a near-syncopal episode and felt very weak and dizzy she fell today. Denies any injury. She's also been having runny/watery stools for 2 days.  VITAL SIGNS:  Blood pressure 120/70, pulse 68, temperature 97.9 F (36.6 C), temperature source Axillary, resp. rate 19, height 5\' 4"  (1.626 m), weight 83 kg (183 lb), SpO2 94 %.  I/O:    Intake/Output Summary (Last 24 hours) at 07/21/16 1207 Last data filed at 07/20/16 2128  Gross per 24 hour  Intake              480 ml  Output                0 ml  Net              480 ml    PHYSICAL EXAMINATION:  GENERAL:  75 y.o.-year-old patient lying  in the bed with no acute distress.  EYES: Pupils equal, round, reactive to light and accommodation. No scleral icterus. Extraocular muscles intact.  HEENT: Head atraumatic, normocephalic. Oropharynx and nasopharynx clear.  NECK:  Supple, no jugular venous distention. No thyroid enlargement, no tenderness.  LUNGS: Normal breath sounds bilaterally, no wheezing, rales,rhonchi or crepitation. No use of accessory muscles of respiration.  CARDIOVASCULAR: S1, S2 normal. No murmurs, rubs, or gallops.  ABDOMEN: Soft, non-tender, non-distended. Bowel sounds present. No organomegaly or mass.  EXTREMITIES: No pedal edema, cyanosis, or clubbing.  NEUROLOGIC: Cranial nerves II through XII are intact. Muscle strength 5/5 in all extremities. Sensation intact. Gait not checked.  PSYCHIATRIC: The patient is alert and oriented x 3.  SKIN: No obvious rash, lesion, or ulcer.   DATA REVIEW:   CBC  Recent Labs Lab 07/19/16 1413  WBC 6.1  HGB 14.7  HCT 43.3  PLT 163    Chemistries   Recent Labs Lab 07/19/16 1413  NA 133*  K 3.6  CL 97*  CO2 26  GLUCOSE 112*  BUN 16  CREATININE 1.24*  CALCIUM 8.7*    Cardiac Enzymes  Recent Labs Lab 07/19/16 1413  TROPONINI <0.03    Microbiology Results  No results found for this or any previous visit.  RADIOLOGY:  Dg Chest 2 View  Result Date: 07/19/2016 CLINICAL DATA:  Cough and congestion for 2 weeks, diarrhea yesterday, nausea without vomiting, fell yesterday, imbalance and dizziness, weakness EXAM: CHEST  2 VIEW COMPARISON:  12/30/2013 FINDINGS: Borderline enlargement of cardiac silhouette. Mediastinal contours and pulmonary vascularity normal. Bronchitic changes without pulmonary infiltrate, pleural effusion or pneumothorax. Bones diffusely demineralized. Prior spinal augmentation procedure at approximately L1. IMPRESSION: Bronchitic changes without acute infiltrate. Electronically Signed   By: Ulyses Southward M.D.   On: 07/19/2016 15:57   Ct Head  Wo Contrast  Result Date: 07/19/2016 CLINICAL DATA:  Increased weakness over last few days, fall, imbalance, dizziness EXAM: CT HEAD WITHOUT CONTRAST TECHNIQUE: Contiguous axial images were obtained from the base of the skull through the vertex without intravenous contrast. COMPARISON:  08/21/2008 FINDINGS: Brain: Age-related atrophy. Normal ventricular morphology. No midline shift or mass effect. Otherwise normal appearance of brain parenchyma. No intracranial hemorrhage, mass lesion, evidence acute infarction or extra-axial fluid collection. Vascular: Unremarkable Skull: Intact Sinuses/Orbits: Clear Other: N/A IMPRESSION: No acute intracranial abnormalities. Electronically Signed   By: Ulyses Southward M.D.   On: 07/19/2016 16:01    EKG:   Orders placed or performed during the hospital encounter of 07/19/16  . ED EKG  . ED EKG  . EKG 12-Lead  . EKG 12-Lead      Management plans discussed with the patient, family and they are in agreement.  CODE STATUS:     Code Status Orders        Start     Ordered   07/19/16 1953  Full code  Continuous     07/19/16 1953    Code Status History    Date Active Date Inactive Code Status Order ID Comments User Context   This patient has a current code status but no historical code status.      TOTAL TIME TAKING CARE OF THIS PATIENT: 35 minutes.    Altamese Dilling M.D on 07/21/2016 at 12:07 PM  Between 7am to 6pm - Pager - [phone]  After 6pm go to [email or web address] - password [email or web address]  [facility]  Office  [phone]  CC: Primary care physician; Lynnea Ferrier, MD   Note: This dictation was prepared with Dragon dictation along with smaller phrase technology. Any transcriptional errors that result from this process are unintentional.

## 2016-07-21 NOTE — Progress Notes (Signed)
DISCHARGE NOTE:  Pt given discharge instructions and prescriptions (Tamiflu). Pt verbalized understanding. Pt wheeled to car.

## 2016-07-21 NOTE — Care Management Note (Signed)
Case Management Note  Patient Details  Name: Pilar Platelizabeth W Zahniser MRN: [id number] Date of Birth: [birth year]  Subjective/Objective:  Case discussed with attending. He states patient is refusing any home health services including  PT. Patient has a walker at home. No CM needs. Case Closed.                   Action/Plan:   Expected Discharge Date:    07/21/2016              Expected Discharge Plan:  Home/Self Care  In-House Referral:     Discharge planning Services  CM Consult  Post Acute Care Choice:    Choice offered to:  Patient  DME Arranged:    DME Agency:     HH Arranged:  Patient Refused HH Agency:     Status of Service:  Completed, signed off  If discussed at [employer] Length of Stay Meetings, dates discussed:    Additional Comments:  Marily MemosLisa M Jacobs, RN 07/21/2016, 11:15 AM

## 2016-07-24 ENCOUNTER — Other Ambulatory Visit: Payer: Self-pay

## 2016-07-24 NOTE — Patient Outreach (Signed)
[facility] Care Management  07/24/2016  Ann Benson 03/23/1941 [id number]   REFERRAL DATE; 07/24/16 REFERRAL SOURCE: Status post hospital discharge 07/19/16 - 07/21/16 REFERRAL REASON: Transition of care follow up CONSENT: Patient verbally agreed to transition of care follow   PROVIDERS:  Dr. [name] III  Dr. Ovid CurdMatthew Wagoner   SUBJECTIVE:  Telephone call to patient regarding transition of care follow up.  HIPPA verified with patient. Patient states she is doing ok other than being weak in her legs and tired. Patient states she doesn't feel like doing a lot. Patient states she has her follow up appointment with her doctor on Monday, July 27, 2016. Patient states she still has a cough. States she is taking the flu medication and the cough medication for this. Patient states she has all of her medications and is taking them as prescribed. Patient states she can afford her medications. Patient states she checks her pulse ox periodically and uses her pro air inhaler as needed.  Patient states she has transportation to her doctor appointments.  Patient states she is going to go out today and drive herself.   Patient states she fell out of bed a couple of weeks ago. Patient denies any injury.  States she just slipped. Patient states she had some bruising to her shoulder. RNCM advised patient to discuss fall with her primary MD at her appointment and have him look at her shoulder. Patient verbally agreed. RNCM discussed fall prevention with patient.   ASSESSMENT: Transition of care follow up.  Patient admitted 07/19/16 to 07/21/16 for acute respiratory failure with hypoxia and influenza. Per MEDICAL RECORD [id number] y.o. femalewith a known history of Hypertension, anxiety, depression, GERD, IBS Patient will benefit from short term transition of care follow up.   PLAN:  RNCM will follow up with patient within 1 week.   [name] RN,BSN,CCM [facility] Telephonic   [phone]

## 2016-07-27 DIAGNOSIS — J101 Influenza due to other identified influenza virus with other respiratory manifestations: Secondary | ICD-10-CM | POA: Diagnosis not present

## 2016-07-27 DIAGNOSIS — E860 Dehydration: Secondary | ICD-10-CM | POA: Diagnosis not present

## 2016-07-27 DIAGNOSIS — R350 Frequency of micturition: Secondary | ICD-10-CM | POA: Diagnosis not present

## 2016-07-27 DIAGNOSIS — I10 Essential (primary) hypertension: Secondary | ICD-10-CM | POA: Diagnosis not present

## 2016-07-27 DIAGNOSIS — R358 Other polyuria: Secondary | ICD-10-CM | POA: Diagnosis not present

## 2016-07-28 ENCOUNTER — Other Ambulatory Visit: Payer: Self-pay

## 2016-07-28 NOTE — Patient Outreach (Signed)
[facility] Care Management  07/28/2016  Ann Benson Nov 27, 1940 [id number]   Telephone call to patient regarding transition of care follow up.  Unable to reach patient. HIPAA compliant voice message left with call back phone number.  PLAN: RNCM will attempt 2nd telephone call to patient within 1 week.  Ann InaDavina Green RN,BSN,CCM [facility] Telephonic  [phone]

## 2016-07-30 ENCOUNTER — Other Ambulatory Visit: Payer: Self-pay

## 2016-07-30 NOTE — Patient Outreach (Signed)
[facility] Care Management  07/30/2016  Pilar Platelizabeth W Reihl Apr 18, 1941 [id number]  Second telephone call to patient regarding transition of care follow up.  Unable to reach patient. Contact states patient is not at home. HIPAA compliant message left with call back phone number.   PLAN; RNCM will attempt 3rd telephone outreach to patient within  1 week.  [name] RN,BSN,CCM [facility] Telephonic  [phone]

## 2016-08-04 ENCOUNTER — Other Ambulatory Visit: Payer: Self-pay

## 2016-08-04 NOTE — Patient Outreach (Signed)
[facility] Care Management  08/04/2016  Ann Benson 16-Aug-1940 [id number]  REFERRAL DATE; 07/24/16 REFERRAL SOURCE: Status post hospital discharge 07/19/16 - 07/21/16 REFERRAL REASON: Transition of care follow up CONSENT: Patient verbally agreed to transition of care follow   PROVIDERS:  Dr. [name] III  Dr. Ovid CurdMatthew Wagoner  SUBJECTIVE: Telephone call to patient regarding transition of care follow up. HIPAA verified with patient. Patient states she is feeling a little better. States she is not totally herself at this point but she does feel better. Patient states she continues to have a cough with some congestion. Patient denies  Fever.  Patient reports she completed her flu pills.  States she followed up with her primary MD on 07/27/16.  Patient denies doctor making any changes to her current plan of care. Patient states she continues to take her cough medication and inhaler as needed. Patient reports her next follow up with her primary MD is March 2017.    RNCM discussed symptoms of bronchitis/ pneumonia.  Advised patient to contact her doctor for these symptoms. Advised patient to move around her home more for activity and to increase her strength. Patient verbalized understanding and agreement.   PLAN: RNCM will follow up with patient within 1 week.  [name] RN,BSN,CCM [facility] Telephonic  [phone]

## 2016-08-07 IMAGING — US US BREAST LTD UNI LEFT INC AXILLA
1 series · 7 of 7 positions shown · non-contrast
Comparison: Previous exams including diagnostic left breast
mammogram and ultrasound dated 05/02/2015.

CLINICAL DATA: Six-month follow-up for probably benign nodule in
the left breast.

EXAM:
2D DIGITAL DIAGNOSTIC LEFT MAMMOGRAM WITH ADJUNCT TOMO
ULTRASOUND LEFT BREAST

[Series 1: us breast ltd uni left inc axilla · 0.04mm/px · 7 of 7 slices shown]
[im 1/7]
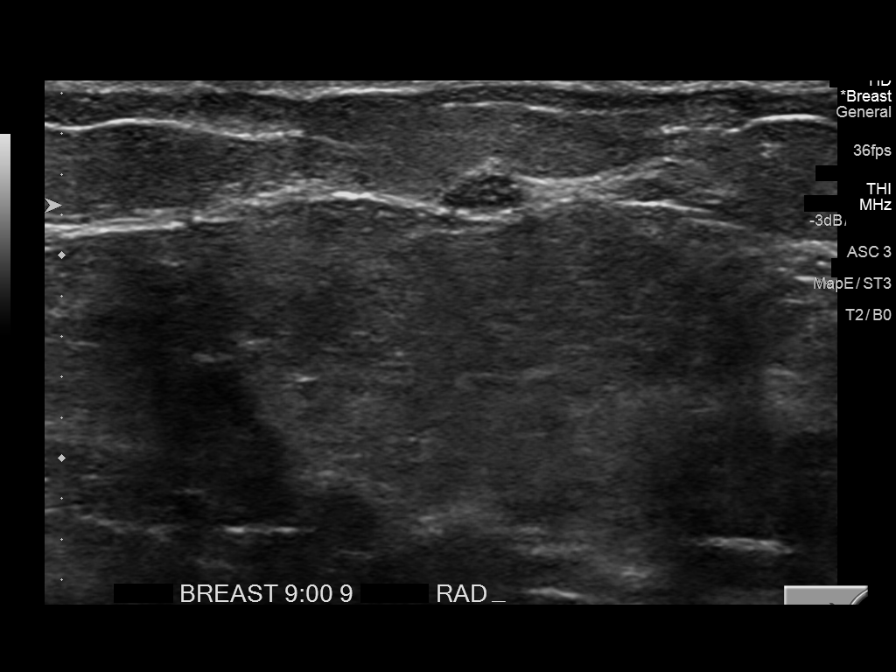
[im 2/7]
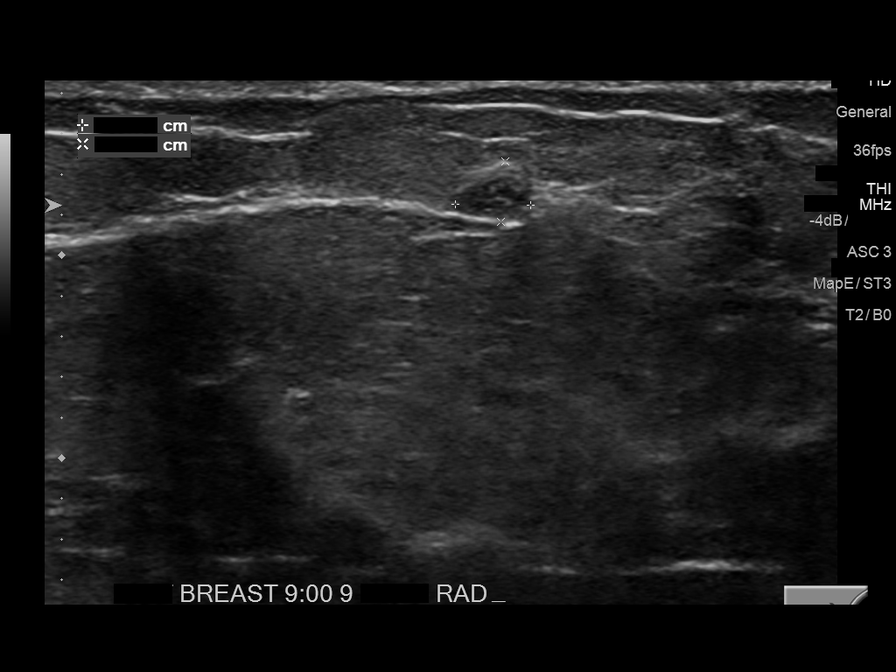
[im 3/7]
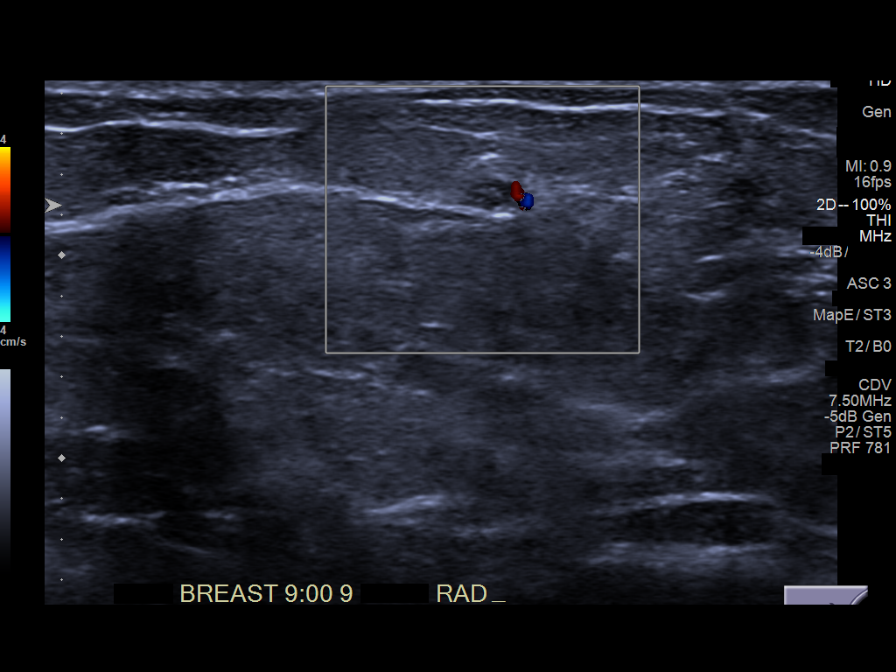
[im 4/7]
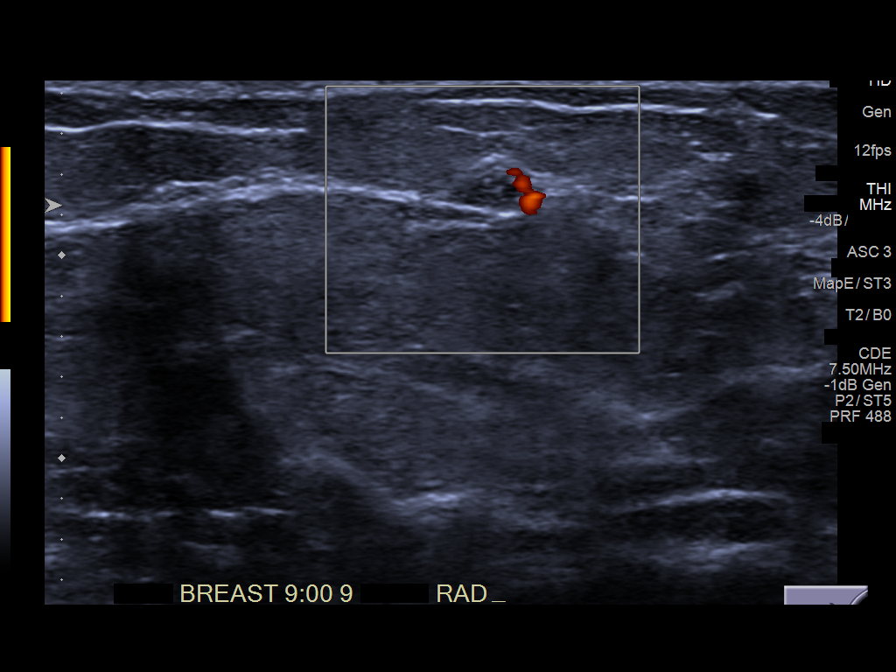
[im 5/7]
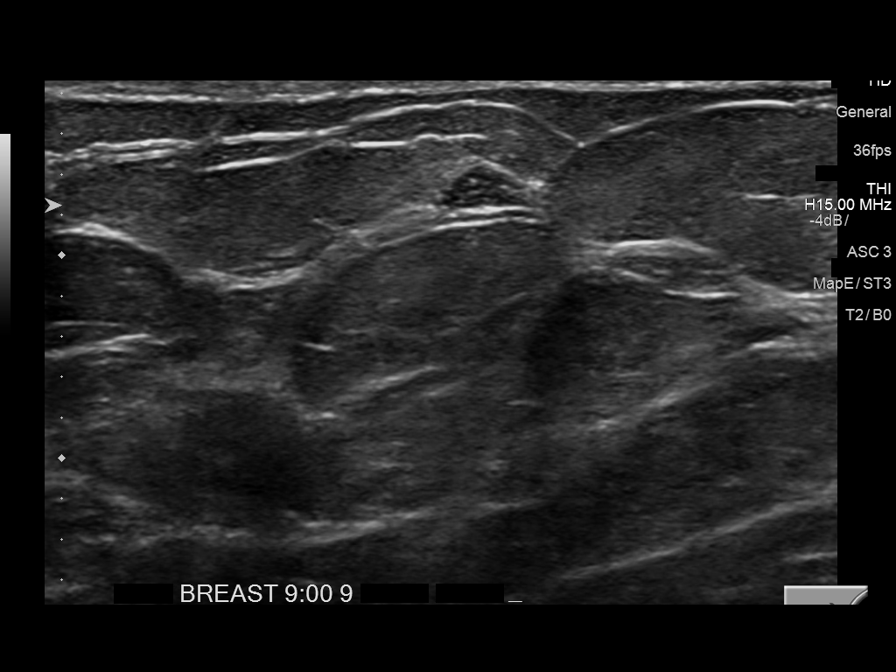
[im 6/7]
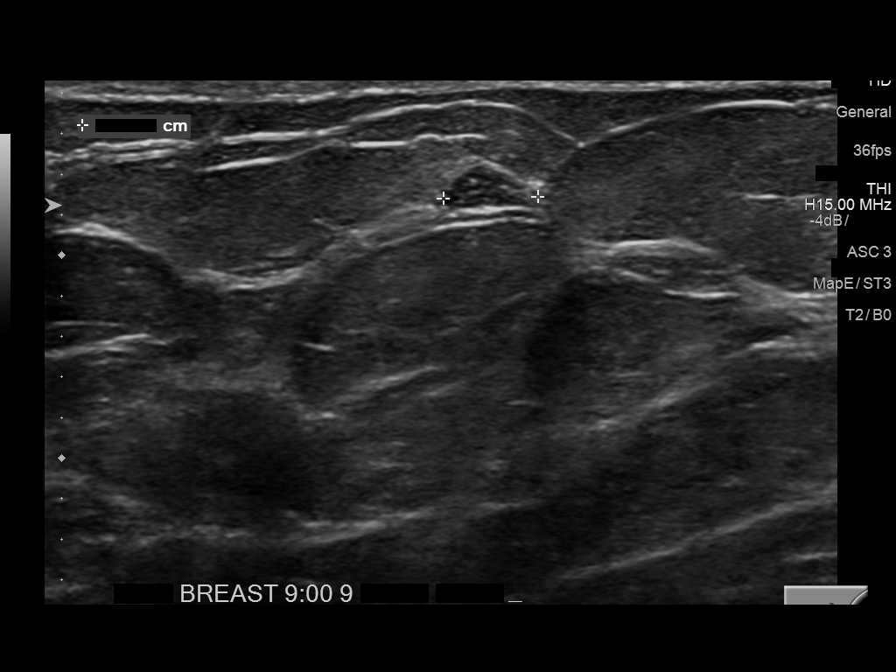
[im 7/7]
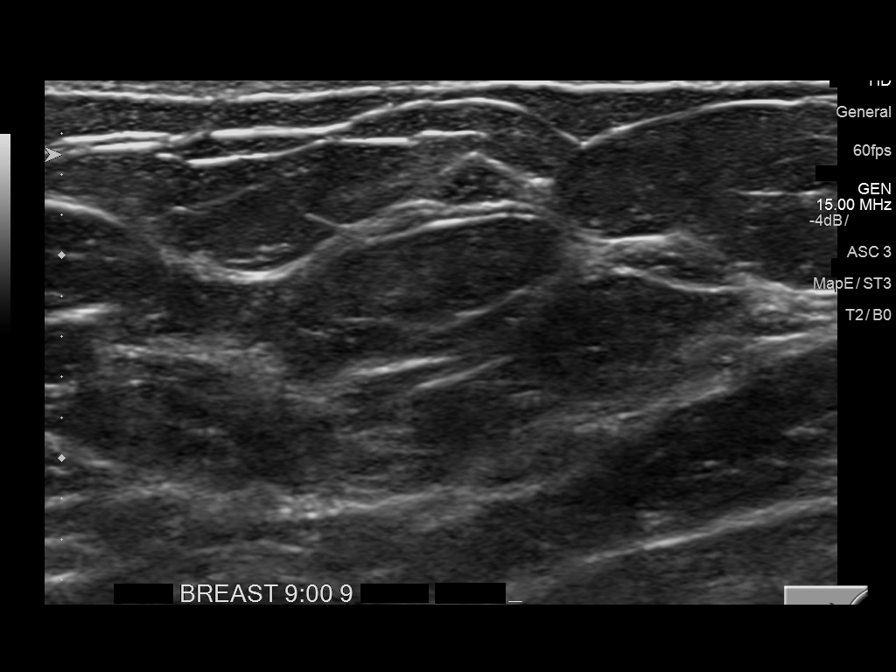

[7 of 7 positions shown; findings below may reference images not displayed]

ACR Breast Density Category b: There are scattered areas of
fibroglandular density.
FINDINGS: There is a stable oval circumscribed mass within the lateral left
left breast, 3 o'clock axis region, measuring approximately 5 mm,
most suggestive of a benign intramammary lymph node, best seen on
tomosynthesis MLO slice 14. There are no new dominant masses,
suspicious calcifications or secondary signs of malignancy within
the left breast. Biopsy site marker within the inner left breast is
stable in position.

Previous ultrasound showed a probably benign hypoechoic mass within
the left breast at the 9 o'clock axis, 9 cm from the nipple, which
will also be further characterized with ultrasound today.

Targeted ultrasound is performed, again showing an oval
circumscribed hypoechoic mass within the left breast at the 9
o'clock axis, 9 cm from the nipple, measuring 0.5 x 0.3 x 0.4 cm,
avascular, stable compared to the previous ultrasound. This is not
felt to correspond to the intramammary lymph node seen on mammogram,
more suggestive of a complicated cyst with internal debris,
corresponding as an incidental finding.
IMPRESSION: Probably benign findings within the left breast, including a
probably benign intramammary lymph node within the lateral left
breast (as seen by mammogram) and a probably benign complicated cyst
at the 9 o'clock axis (as seen by ultrasound).

Recommend additional follow-up left breast diagnostic mammogram and
ultrasound in 6 months to ensure continued stability. This will be
performed as a bilateral diagnostic mammogram in conjunction with
patient's routine right breast screening mammogram schedule.

RECOMMENDATION:
Bilateral diagnostic mammogram, and left breast ultrasound, in 6
months.

I have discussed the findings and recommendations with the patient.
Results were also provided in writing at the conclusion of the
visit. If applicable, a reminder letter will be sent to the patient
regarding the next appointment.

BI-RADS CATEGORY  3: Probably benign.

## 2016-08-13 ENCOUNTER — Ambulatory Visit: Payer: Self-pay

## 2016-08-17 DIAGNOSIS — M5136 Other intervertebral disc degeneration, lumbar region: Secondary | ICD-10-CM | POA: Diagnosis not present

## 2016-08-17 DIAGNOSIS — M48062 Spinal stenosis, lumbar region with neurogenic claudication: Secondary | ICD-10-CM | POA: Diagnosis not present

## 2016-08-17 DIAGNOSIS — M5416 Radiculopathy, lumbar region: Secondary | ICD-10-CM | POA: Diagnosis not present

## 2016-08-18 ENCOUNTER — Other Ambulatory Visit: Payer: Self-pay

## 2016-08-18 NOTE — Patient Outreach (Signed)
[facility]) Care Management  08/18/2016  Ann Benson May 03, 1941 [id number]  REFERRAL DATE; 07/24/16 REFERRAL SOURCE: Status post hospital discharge 07/19/16 - 07/21/16 REFERRAL REASON: Transition of care follow up CONSENT: Patient verbally agreed to transition of care follow   PROVIDERS:  Dr. [name] III  Dr. Ovid CurdMatthew Wagoner  SUBJECTIVE: Telephone call to patient regarding transition of care follow up . HIPAA verified by patient. Patient states she was not able to get rid of the chest congestion so her doctor put her on more antibiotics and musinex. Patient states she finished her antibiotics on yesterday. States she is unsure of the name. States she threw the box away already. Patient states she continues to take her musinex 1 every 12 hours. RNCM advised patient to make sure she takes the musinex according to the instructions and/ or advisement of her doctor. Patient states she feels a little better today than she felt yesterday.  Patient states she has been having a lot of problems out of her feet and legs. Patient state she has had problems with her legs feeling numb, cold, big toes turning blue and stinging and burning. Patient states her doctor referred her to an orthopedic doctor. States she had an MRI done. Patient states the doctor thinks that her back is causing circulation problems in her legs and feet. Patient states she is scheduled for her first injection on 09/10/16. Patient states she has been checked for diabetes and it has been confirmed that she does not have it. Patient reports prior to being admitted to the hospital in December 2017 for flu she fell off of the side of her bed when she tried to stand up because her legs were number.  Patient states she is hopeful that the injections will work.  Patient denies any pain presently.  RNCM advised patient to move around as much as she can. Patient confirms that the symptoms in her legs and feet get worse when  sitting or stationary for long periods of time. Patient states she uses asper cream on her legs as needed.  Patient denies any pain in her back at this time.  States her back hurts when she is cleaning.  [facility]   ASSESSMENT; Continued transition of care follow up.  PLAN; RNCM will follow up with patient within  1 week.   Ann InaDavina Green RN,BSN,CCM [facility] Telephonic  [phone]

## 2016-08-25 ENCOUNTER — Other Ambulatory Visit: Payer: Self-pay

## 2016-08-25 NOTE — Patient Outreach (Signed)
[facility]) Care Management  08/25/2016  Pilar Platelizabeth W Crothers [birth year] [id number]  REFERRAL DATE; 07/24/16 REFERRAL SOURCE: Status post hospital discharge 07/19/16 - 07/21/16 REFERRAL REASON: Transition of care follow up CONSENT: Patient verbally agreed to transition of care follow   PROVIDERS:  Dr. [name] III  Dr. Ovid CurdMatthew Wagoner   SUBJECTIVE; Telephone call to patient regarding transition of care follow up. HIPAA verified with patient. Patient states she doing much better with her cold. Patient reports the musinex worked very well and she is not coughing as much. Patient states she still has some horsiness.  Patient denies any new onset of symptoms. States she continues to have trouble with her legs and feet. Patient denies any falls since last outreach with RNCM. Patient states she continues to take her medications as prescribed.   PLAN: RNCM will follow up with patient within  1 month.   [name] RN,BSN,CCM [facility] Telephonic  [phone]

## 2016-09-10 DIAGNOSIS — M5136 Other intervertebral disc degeneration, lumbar region: Secondary | ICD-10-CM | POA: Diagnosis not present

## 2016-09-10 DIAGNOSIS — M48062 Spinal stenosis, lumbar region with neurogenic claudication: Secondary | ICD-10-CM | POA: Diagnosis not present

## 2016-09-10 DIAGNOSIS — M5416 Radiculopathy, lumbar region: Secondary | ICD-10-CM | POA: Diagnosis not present

## 2016-09-18 ENCOUNTER — Other Ambulatory Visit: Payer: Self-pay

## 2016-09-18 DIAGNOSIS — R300 Dysuria: Secondary | ICD-10-CM | POA: Diagnosis not present

## 2016-09-18 DIAGNOSIS — N3090 Cystitis, unspecified without hematuria: Secondary | ICD-10-CM | POA: Diagnosis not present

## 2016-09-18 NOTE — Patient Outreach (Signed)
[facility]) Care Management  09/18/2016  Ann Benson 04/13/1941 [id number]  REFERRAL DATE; 07/24/16 REFERRAL SOURCE: Status post hospital discharge 07/19/16 - 07/21/16 REFERRAL REASON: Transition of care follow up CONSENT: Patient verbally agreed to transition of care follow   PROVIDERS:  Dr. [name] III  Dr. Ovid CurdMatthew Wagoner  SUBJECTIVE: Telephone call to patient for follow up.  HIPAA verified with patient. Patient states she is doing better for her respiratory infection. Patient states she is no longer taking musinex or any of the other medication related to the respiratory infection she had.  Patient states she had her first back injection on 09/10/16.  Patient states she can tell it is helping for feet and legs not feel so painful, numb and cold.  Patient states she has another injection scheduled for approximately 3 weeks from now.  Patient states she can tell a difference in her walking. Patient denies any recent falls.  Patient states she has noticed burning when she urinates and her back has been bothering her. RNCM advised patient to call her primary MD this morning or go to her primary MD clinic walk in today to be evaluated.  Patient states she thinks she may have a urinary tract infection.  Patient verbally [facility] up with her within a week regarding her urinary symptoms.  Patient [facility].   RNCM verified that patient had contact number for [facility] and RNCM.    ASSESSMENT:  New onset of symptoms:  Burning upon urination and back pain.  RNCM requested patient call primary MD today or go to primary MD walk in clinic today.   PLAN: RNCM will follow up with patient within  1 week.  [name] RN,BSN,CCM [facility] Telephonic  [phone]

## 2016-09-21 ENCOUNTER — Other Ambulatory Visit: Payer: Self-pay

## 2016-09-21 NOTE — Patient Outreach (Signed)
[facility]) Care Management  09/21/2016  Ann Benson Dec 02, 1940 [id number]  REFERRAL DATE; 07/24/16 REFERRAL REASON: Status post hospital discharge   PROVIDERS:  Dr. [name] III Dr. Ovid CurdMatthew Wagoner  SUBJECTIVE; Telephone call to patient for follow up. HIPAA verified with patient. Patient states she saw her doctor on Friday 09/18/16 for urinary symptoms. Patient states she was prescribed antibiotics. States she has had several days on the medication. Patient states she still has some discomfort in her back but states she can tell that her symptoms are less with the frequency and burning upon urination. Patient states she is taking her antibiotic 3 times per day as directed by her doctor.  Patient denies any new problems/ concerns at this time.   ASSESSMENT: Patient has been treated by physician for urinary symptoms.   PLAN:  RNCM will follow up with patient within 1 month.   [name] RN,BSN,CCM [facility] Telephonic  [phone]

## 2016-09-24 ENCOUNTER — Ambulatory Visit: Payer: PPO

## 2016-10-15 DIAGNOSIS — M5136 Other intervertebral disc degeneration, lumbar region: Secondary | ICD-10-CM | POA: Diagnosis not present

## 2016-10-15 DIAGNOSIS — M48062 Spinal stenosis, lumbar region with neurogenic claudication: Secondary | ICD-10-CM | POA: Diagnosis not present

## 2016-10-15 DIAGNOSIS — M5416 Radiculopathy, lumbar region: Secondary | ICD-10-CM | POA: Diagnosis not present

## 2016-10-19 ENCOUNTER — Other Ambulatory Visit: Payer: Self-pay

## 2016-10-19 DIAGNOSIS — I10 Essential (primary) hypertension: Secondary | ICD-10-CM | POA: Diagnosis not present

## 2016-10-19 DIAGNOSIS — E860 Dehydration: Secondary | ICD-10-CM | POA: Diagnosis not present

## 2016-10-19 NOTE — Patient Outreach (Signed)
[facility]) Care Management  10/19/2016  Ann Benson Aug 05, 1941 [id number]   SUBJECTIVE; Telephone call to patient for telephone assessment. HIPAA verified with patient. Patient states she is doing better. Patient states she completed her antibiotics for the urinary tract infection. Patient denies any urinary tract infection symptoms. Patient states she had a shot for her back on 10/14/16.  Patient reports her back is doing better. Patient states she continues to have some aching in her legs and hands due to her arthritis. Patient states she still has numbness in her feet on occasion. Patient states her doctor is aware of this. Patient states her next follow up appointment with her primary MD is 10/26/16.   Patient states she has an appointment on 12/16/16 for another shot for her back.  Patient states she is going to have her tooth pulled on tomorrow. Patient states she is on antibiotics for her tooth.   Patient denies having any additional needs at this time. Patient states she is taking her medications ans prescribed and is able to afford her medicines.   ASSESSMENT: Patient self managing care. Patient keeps follow up doctor appointments. Patient is able to her own transportation to appointments. Patient states she takes her medications as prescribed.   PLAN: RNCM will refer patient to care management assistant to close due to goal being met. RNCM will send patient  [facility] care management closure letter [facility] will send closure letter to patients primary MD   Quinn Plowman RN,BSN,CCM [facility] Telephonic  [phone]

## 2016-10-26 DIAGNOSIS — I10 Essential (primary) hypertension: Secondary | ICD-10-CM | POA: Diagnosis not present

## 2016-10-26 DIAGNOSIS — I700 Atherosclerosis of aorta: Secondary | ICD-10-CM | POA: Diagnosis not present

## 2016-10-26 DIAGNOSIS — I739 Peripheral vascular disease, unspecified: Secondary | ICD-10-CM | POA: Diagnosis not present

## 2016-10-26 DIAGNOSIS — K219 Gastro-esophageal reflux disease without esophagitis: Secondary | ICD-10-CM | POA: Diagnosis not present

## 2016-10-26 DIAGNOSIS — F3342 Major depressive disorder, recurrent, in full remission: Secondary | ICD-10-CM | POA: Diagnosis not present

## 2016-10-26 DIAGNOSIS — F419 Anxiety disorder, unspecified: Secondary | ICD-10-CM | POA: Diagnosis not present

## 2016-10-26 DIAGNOSIS — E784 Other hyperlipidemia: Secondary | ICD-10-CM | POA: Diagnosis not present

## 2016-12-16 DIAGNOSIS — M5136 Other intervertebral disc degeneration, lumbar region: Secondary | ICD-10-CM | POA: Diagnosis not present

## 2016-12-16 DIAGNOSIS — M5416 Radiculopathy, lumbar region: Secondary | ICD-10-CM | POA: Diagnosis not present

## 2016-12-16 DIAGNOSIS — M48062 Spinal stenosis, lumbar region with neurogenic claudication: Secondary | ICD-10-CM | POA: Diagnosis not present

## 2016-12-17 DIAGNOSIS — M545 Low back pain: Secondary | ICD-10-CM | POA: Diagnosis not present

## 2017-02-24 DIAGNOSIS — E559 Vitamin D deficiency, unspecified: Secondary | ICD-10-CM | POA: Diagnosis not present

## 2017-02-24 DIAGNOSIS — E538 Deficiency of other specified B group vitamins: Secondary | ICD-10-CM | POA: Diagnosis not present

## 2017-02-24 DIAGNOSIS — R202 Paresthesia of skin: Secondary | ICD-10-CM | POA: Diagnosis not present

## 2017-02-24 DIAGNOSIS — L819 Disorder of pigmentation, unspecified: Secondary | ICD-10-CM | POA: Diagnosis not present

## 2017-02-24 DIAGNOSIS — G63 Polyneuropathy in diseases classified elsewhere: Secondary | ICD-10-CM | POA: Diagnosis not present

## 2017-02-24 DIAGNOSIS — N183 Chronic kidney disease, stage 3 (moderate): Secondary | ICD-10-CM | POA: Diagnosis not present

## 2017-03-04 DIAGNOSIS — G63 Polyneuropathy in diseases classified elsewhere: Secondary | ICD-10-CM | POA: Insufficient documentation

## 2017-03-04 DIAGNOSIS — N183 Chronic kidney disease, stage 3 unspecified: Secondary | ICD-10-CM | POA: Insufficient documentation

## 2017-03-23 DIAGNOSIS — N183 Chronic kidney disease, stage 3 (moderate): Secondary | ICD-10-CM | POA: Diagnosis not present

## 2017-03-23 DIAGNOSIS — R600 Localized edema: Secondary | ICD-10-CM | POA: Diagnosis not present

## 2017-03-23 DIAGNOSIS — D751 Secondary polycythemia: Secondary | ICD-10-CM | POA: Diagnosis not present

## 2017-03-31 DIAGNOSIS — J449 Chronic obstructive pulmonary disease, unspecified: Secondary | ICD-10-CM | POA: Insufficient documentation

## 2017-03-31 DIAGNOSIS — K219 Gastro-esophageal reflux disease without esophagitis: Secondary | ICD-10-CM | POA: Insufficient documentation

## 2017-03-31 NOTE — Progress Notes (Deleted)
MRN : [id number]  Ann Benson is a 76 y.o. (07/05/1941) female who presents with chief complaint of No chief complaint on file. Marland Kitchen  History of Present Illness: ***  No outpatient prescriptions have been marked as taking for the 04/01/17 encounter (Appointment) with Gilda Crease, Latina Craver, MD.    Past Medical History:  Diagnosis Date  . Arthritis   . Asthma     Past Surgical History:  Procedure Laterality Date  . ABDOMINAL HYSTERECTOMY    . APPENDECTOMY    . BREAST BIOPSY Left 07/2013   neg  . CHOLECYSTECTOMY    . SHOULDER ARTHROSCOPY Left     Social History Social History  Substance Use Topics  . Smoking status: Never Smoker  . Smokeless tobacco: Never Used  . Alcohol use No    Family History Family History  Problem Relation Age of Onset  . Diabetes Mother   . Diabetes Brother   . Hypertension Brother   . Breast cancer Neg Hx   No family history of bleeding/clotting disorders, porphyria or autoimmune disease   Allergies  Allergen Reactions  . Acetaminophen Palpitations     REVIEW OF SYSTEMS (Negative unless checked)  Constitutional: [] Weight loss  [] Fever  [] Chills Cardiac: [] Chest pain   [] Chest pressure   [] Palpitations   [] Shortness of breath when laying flat   [] Shortness of breath with exertion. Vascular:  [] Pain in legs with walking   [] Pain in legs at rest  [] History of DVT   [] Phlebitis   [] Swelling in legs   [] Varicose veins   [] Non-healing ulcers Pulmonary:   [] Uses home oxygen   [] Productive cough   [] Hemoptysis   [] Wheeze  [] COPD   [] Asthma Neurologic:  [] Dizziness   [] Seizures   [] History of stroke   [] History of TIA  [] Aphasia   [] Vissual changes   [] Weakness or numbness in arm   [] Weakness or numbness in leg Musculoskeletal:   [] Joint swelling   [] Joint pain   [] Low back pain Hematologic:  [] Easy bruising  [] Easy bleeding   [] Hypercoagulable state   [] Anemic Gastrointestinal:  [] Diarrhea   [] Vomiting  [] Gastroesophageal reflux/heartburn    [] Difficulty swallowing. Genitourinary:  [] Chronic kidney disease   [] Difficult urination  [] Frequent urination   [] Blood in urine Skin:  [] Rashes   [] Ulcers  Psychological:  [] History of anxiety   []  History of major depression.  Physical Examination  There were no vitals filed for this visit. There is no height or weight on file to calculate BMI. Gen: WD/WN, NAD Head: [place]/AT, No temporalis wasting.  Ear/Nose/Throat: Hearing grossly intact, nares w/o erythema or drainage, poor dentition Eyes: PER, EOMI, sclera nonicteric.  Neck: Supple, no masses.  No bruit or JVD.  Pulmonary:  Good air movement, clear to auscultation bilaterally, no use of accessory muscles.  Cardiac: RRR, normal S1, S2, no Murmurs. Vascular: *** Vessel Right Left  Radial Palpable Palpable  Ulnar Palpable Palpable  Brachial Palpable Palpable  Carotid Palpable Palpable  Femoral Palpable Palpable  Popliteal Palpable Palpable  PT Palpable Palpable  DP Palpable Palpable   Gastrointestinal: soft, non-distended. No guarding/no peritoneal signs.  Musculoskeletal: M/S 5/5 throughout.  No deformity or atrophy.  Neurologic: CN 2-12 intact. Pain and light touch intact in extremities.  Symmetrical.  Speech is fluent. Motor exam as listed above. Psychiatric: Judgment intact, Mood & affect appropriate for pt's clinical situation. Dermatologic: No rashes or ulcers noted.  No changes consistent with cellulitis. Lymph : No Cervical lymphadenopathy, no lichenification or skin changes of  chronic lymphedema.  CBC Lab Results  Component Value Date   WBC 6.1 07/19/2016   HGB 14.7 07/19/2016   HCT 43.3 07/19/2016   MCV 88.2 07/19/2016   PLT 163 07/19/2016    BMET    Component Value Date/Time   NA 133 (L) 07/19/2016 1413   NA 137 12/31/2013 0509   K 3.6 07/19/2016 1413   K 4.1 12/31/2013 0509   CL 97 (L) 07/19/2016 1413   CL 103 12/31/2013 0509   CO2 26 07/19/2016 1413   CO2 27 12/31/2013 0509   GLUCOSE 112 (H)  07/19/2016 1413   GLUCOSE 96 12/31/2013 0509   BUN 16 07/19/2016 1413   BUN 14 12/31/2013 0509   CREATININE 1.24 (H) 07/19/2016 1413   CREATININE 1.02 12/31/2013 0509   CALCIUM 8.7 (L) 07/19/2016 1413   CALCIUM 8.8 12/31/2013 0509   GFRNONAA 41 (L) 07/19/2016 1413   GFRNONAA 54 (L) 12/31/2013 0509   GFRAA 47 (L) 07/19/2016 1413   GFRAA >60 12/31/2013 0509   CrCl cannot be calculated (Patient's most recent lab result is older than the maximum 21 days allowed.).  COAG No results found for: INR, PROTIME  Radiology No results found.   Assessment/Plan 1. Chronic obstructive pulmonary disease, unspecified COPD type (HCC) ***  2. Gastroesophageal reflux disease without esophagitis ***    Levora Dredge, MD  03/31/2017 9:50 PM

## 2017-04-01 ENCOUNTER — Encounter (INDEPENDENT_AMBULATORY_CARE_PROVIDER_SITE_OTHER): Payer: PPO | Admitting: Vascular Surgery

## 2017-04-05 DIAGNOSIS — G608 Other hereditary and idiopathic neuropathies: Secondary | ICD-10-CM | POA: Diagnosis not present

## 2017-04-13 DIAGNOSIS — R600 Localized edema: Secondary | ICD-10-CM | POA: Diagnosis not present

## 2017-04-13 DIAGNOSIS — I7300 Raynaud's syndrome without gangrene: Secondary | ICD-10-CM | POA: Diagnosis not present

## 2017-04-13 DIAGNOSIS — D751 Secondary polycythemia: Secondary | ICD-10-CM | POA: Diagnosis not present

## 2017-04-13 DIAGNOSIS — N183 Chronic kidney disease, stage 3 (moderate): Secondary | ICD-10-CM | POA: Diagnosis not present

## 2017-04-16 DIAGNOSIS — M150 Primary generalized (osteo)arthritis: Secondary | ICD-10-CM | POA: Diagnosis not present

## 2017-04-16 DIAGNOSIS — G629 Polyneuropathy, unspecified: Secondary | ICD-10-CM | POA: Diagnosis not present

## 2017-04-16 DIAGNOSIS — I872 Venous insufficiency (chronic) (peripheral): Secondary | ICD-10-CM | POA: Diagnosis not present

## 2017-04-26 ENCOUNTER — Ambulatory Visit (INDEPENDENT_AMBULATORY_CARE_PROVIDER_SITE_OTHER): Payer: PPO | Admitting: Vascular Surgery

## 2017-04-26 ENCOUNTER — Encounter (INDEPENDENT_AMBULATORY_CARE_PROVIDER_SITE_OTHER): Payer: Self-pay | Admitting: Vascular Surgery

## 2017-04-26 DIAGNOSIS — M79604 Pain in right leg: Secondary | ICD-10-CM

## 2017-04-26 DIAGNOSIS — M79605 Pain in left leg: Secondary | ICD-10-CM | POA: Diagnosis not present

## 2017-04-26 DIAGNOSIS — I739 Peripheral vascular disease, unspecified: Secondary | ICD-10-CM | POA: Diagnosis not present

## 2017-04-26 DIAGNOSIS — G629 Polyneuropathy, unspecified: Secondary | ICD-10-CM

## 2017-04-26 DIAGNOSIS — I872 Venous insufficiency (chronic) (peripheral): Secondary | ICD-10-CM | POA: Diagnosis not present

## 2017-04-26 DIAGNOSIS — M4716 Other spondylosis with myelopathy, lumbar region: Secondary | ICD-10-CM | POA: Diagnosis not present

## 2017-04-26 DIAGNOSIS — M79606 Pain in leg, unspecified: Secondary | ICD-10-CM | POA: Insufficient documentation

## 2017-04-26 NOTE — Progress Notes (Signed)
MRN : [id number]  Ann Benson is a 76 y.o. (1941/06/21) female who presents with chief complaint of  Chief Complaint  Patient presents with  . New Patient (Initial Visit)    le discoloration,pain  .  History of Present Illness: The patient is seen for evaluation of painful lower extremities. Patient notes the pain is constant and not always associated with activity.  The pain is somewhat consistent day to day occurring on most days. The patient notes the pain also occurs with standing and routinely seems worse as the day wears on. She is also concerned about blue discoloration of both feet that occurs with dependency.  No history of DVT.  The pain has been progressive over the past year. The patient states these symptoms are causing  a profound negative impact on quality of life and daily activities.  The patient denies rest pain or dangling of an extremity off the side of the bed during the night for relief. No open wounds or sores at this time. No history of DVT or phlebitis. No prior interventions or surgeries.  There is a  history of back problems and DJD of the lumbar and sacral spine.    Current Meds  Medication Sig  . Cyanocobalamin (B-12) 5000 MCG CAPS Take by mouth daily.  . DULoxetine (CYMBALTA) 60 MG capsule TAKE 1 CAPSULE (60 MG TOTAL) BY MOUTH ONCE DAILY.  Marland Kitchen gabapentin (NEURONTIN) 100 MG capsule gabapentin 100 mg capsule  . gabapentin (NEURONTIN) 300 MG capsule   . hydrochlorothiazide (HYDRODIURIL) 12.5 MG tablet Take 12.5 mg by mouth daily.  . temazepam (RESTORIL) 30 MG capsule Take by mouth.  . triamterene-hydrochlorothiazide (MAXZIDE-25) 37.5-25 MG tablet triamterene 37.5 mg-hydrochlorothiazide 25 mg tablet    Past Medical History:  Diagnosis Date  . Arthritis   . Asthma     Past Surgical History:  Procedure Laterality Date  . ABDOMINAL HYSTERECTOMY    . APPENDECTOMY    . BREAST BIOPSY Left 07/2013   neg  . CHOLECYSTECTOMY    . SHOULDER  ARTHROSCOPY Left     Social History Social History  Substance Use Topics  . Smoking status: Never Smoker  . Smokeless tobacco: Never Used  . Alcohol use No    Family History Family History  Problem Relation Age of Onset  . Diabetes Mother   . Diabetes Brother   . Hypertension Brother   . Breast cancer Neg Hx   No family history of bleeding/clotting disorders, porphyria or autoimmune disease   Allergies  Allergen Reactions  . Aspirin Other (See Comments)    Epitaxis   . Atorvastatin Other (See Comments)  . Statins Other (See Comments)  . Acetaminophen Palpitations     REVIEW OF SYSTEMS (Negative unless checked)  Constitutional: Weight loss  Fever  Chills Cardiac: Chest pain   Chest pressure   Palpitations   Shortness of breath when laying flat   Shortness of breath with exertion. Vascular:  Pain in legs with walking   Pain in legs at rest  History of DVT   Phlebitis   Swelling in legs   Varicose veins   Non-healing ulcers Pulmonary:   Uses home oxygen   Productive cough   Hemoptysis   Wheeze  COPD   Asthma Neurologic:  Dizziness   Seizures   History of stroke   History of TIA  Aphasia   Vissual changes   Weakness or numbness in arm   Weakness or numbness in leg Musculoskeletal:   Joint  swelling   Joint pain   Low back pain Hematologic:  Easy bruising  Easy bleeding   Hypercoagulable state   Anemic Gastrointestinal:  Diarrhea   Vomiting  Gastroesophageal reflux/heartburn   Difficulty swallowing. Genitourinary:  Chronic kidney disease   Difficult urination  Frequent urination   Blood in urine Skin:  Rashes   Ulcers  Psychological:  History of anxiety    History of major depression.  Physical Examination  Vitals:   04/26/17 1457  BP: 134/79  Pulse: 84  Resp: 16  Weight: 186 lb 12.8 oz (84.7 kg)  Height:  (1.626 m)   Body mass index is 32.06 kg/m. Gen: WD/WN,  NAD Head: [place]/AT, No temporalis wasting.  Ear/Nose/Throat: Hearing grossly intact, nares w/o erythema or drainage, poor dentition Eyes: PER, EOMI, sclera nonicteric.  Neck: Supple, no masses.  No bruit or JVD.  Pulmonary:  Good air movement, clear to auscultation bilaterally, no use of accessory muscles.  Cardiac: RRR, normal S1, S2, no Murmurs. Vascular: scattered varicosities present extensively bilaterally.  moderate venous stasis changes to the legs bilaterally with cyanosis of the feet with dependency.  2-3+ soft pitting edema Vessel Right Left  Radial Palpable Palpable  PT Not Palpable Not Palpable  DP Not Palpable Not Palpable   Gastrointestinal: soft, non-distended. No guarding/no peritoneal signs.  Musculoskeletal: M/S 5/5 throughout.  No deformity or atrophy.  Neurologic: CN 2-12 intact. Pain and light touch intact in extremities.  Symmetrical.  Speech is fluent. Motor exam as listed above. Psychiatric: Judgment intact, Mood & affect appropriate for pt's clinical situation. Dermatologic: Venous rashes no ulcers noted.  No changes consistent with cellulitis. Lymph : No Cervical lymphadenopathy, no lichenification or skin changes of chronic lymphedema.  CBC Lab Results  Component Value Date   WBC 6.1 07/19/2016   HGB 14.7 07/19/2016   HCT 43.3 07/19/2016   MCV 88.2 07/19/2016   PLT 163 07/19/2016    BMET    Component Value Date/Time   NA 133 (L) 07/19/2016 1413   NA 137 12/31/2013 0509   K 3.6 07/19/2016 1413   K 4.1 12/31/2013 0509   CL 97 (L) 07/19/2016 1413   CL 103 12/31/2013 0509   CO2 26 07/19/2016 1413   CO2 27 12/31/2013 0509   GLUCOSE 112 (H) 07/19/2016 1413   GLUCOSE 96 12/31/2013 0509   BUN 16 07/19/2016 1413   BUN 14 12/31/2013 0509   CREATININE 1.24 (H) 07/19/2016 1413   CREATININE 1.02 12/31/2013 0509   CALCIUM 8.7 (L) 07/19/2016 1413   CALCIUM 8.8 12/31/2013 0509   GFRNONAA 41 (L) 07/19/2016 1413   GFRNONAA 54 (L) 12/31/2013 0509   GFRAA 47 (L)  07/19/2016 1413   GFRAA >60 12/31/2013 0509   CrCl cannot be calculated (Patient's most recent lab result is older than the maximum 21 days allowed.).  COAG No results found for: INR, PROTIME  Radiology No results found.  Assessment/Plan 1. Pain in both lower extremities  Recommend:  The patient has atypical pain symptoms for pure atherosclerotic disease. However, on physical exam there is evidence of mixed venous and arterial disease, given the diminished pulses and the edema associated with venous changes of the legs.  Noninvasive studies including ABI's and venous ultrasound of the legs will be obtained and the patient will follow up with me to review these studies.  The patient should continue walking and begin a more formal exercise program. The patient should continue his antiplatelet therapy and aggressive treatment of the lipid  abnormalities.  The patient should begin wearing graduated compression socks 15-20 mmHg strength to control edema.  - VAS [place] ABI WITH/WO TBI; Future - VAS [place] LOWER EXTREMITY VENOUS REFLUX; Future  2. Chronic venous insufficiency No surgery or intervention at this point in time.    I have had a long discussion with the patient regarding venous insufficiency and why it  causes symptoms. I have discussed with the patient the chronic skin changes that accompany venous insufficiency and the long term sequela such as infection and ulceration.  Patient will begin wearing graduated compression stockings class 1 (20-30 mmHg) or compression wraps on a daily basis a prescription was given. The patient will put the stockings on first thing in the morning and removing them in the evening. The patient is instructed specifically not to sleep in the stockings.    In addition, behavioral modification including several periods of elevation of the lower extremities during the day will be continued. I have demonstrated that proper elevation is a position with the ankles at  heart level.  The patient is instructed to begin routine exercise, especially walking on a daily basis  Patient should undergo duplex ultrasound of the venous system to ensure that DVT or reflux is not present.  Following the review of the ultrasound the patient will follow up in 2-3 months to reassess the degree of swelling and the control that graduated compression stockings or compression wraps  is offering.   The patient can be assessed for a Lymph Pump at that time - VAS [place] LOWER EXTREMITY VENOUS REFLUX; Future  3. PAD (peripheral artery disease) (HCC) See #1&2 - VAS [place] ABI WITH/WO TBI; Future  4. Neuropathy Treatment plan per Dr Sherryll Burger  5. Lumbar spondylosis with myelopathy May need referral to Spine Surgery    Levora Dredge, MD  04/26/2017 3:27 PM

## 2017-04-27 DIAGNOSIS — L819 Disorder of pigmentation, unspecified: Secondary | ICD-10-CM | POA: Diagnosis not present

## 2017-04-27 DIAGNOSIS — G608 Other hereditary and idiopathic neuropathies: Secondary | ICD-10-CM | POA: Diagnosis not present

## 2017-04-27 DIAGNOSIS — R4189 Other symptoms and signs involving cognitive functions and awareness: Secondary | ICD-10-CM | POA: Diagnosis not present

## 2017-04-29 ENCOUNTER — Telehealth (INDEPENDENT_AMBULATORY_CARE_PROVIDER_SITE_OTHER): Payer: Self-pay

## 2017-04-29 NOTE — Telephone Encounter (Signed)
Patient called stating she was to have an MRI with Dr. Gilda Crease and I let her know she will be seen in our office for an ABI and venous u/s instead.

## 2017-05-04 ENCOUNTER — Other Ambulatory Visit: Payer: Self-pay | Admitting: Internal Medicine

## 2017-05-04 DIAGNOSIS — N6002 Solitary cyst of left breast: Secondary | ICD-10-CM

## 2017-05-04 DIAGNOSIS — N63 Unspecified lump in unspecified breast: Secondary | ICD-10-CM

## 2017-05-26 ENCOUNTER — Ambulatory Visit (INDEPENDENT_AMBULATORY_CARE_PROVIDER_SITE_OTHER): Payer: PPO | Admitting: Vascular Surgery

## 2017-05-26 ENCOUNTER — Ambulatory Visit (INDEPENDENT_AMBULATORY_CARE_PROVIDER_SITE_OTHER): Payer: PPO

## 2017-05-26 ENCOUNTER — Encounter (INDEPENDENT_AMBULATORY_CARE_PROVIDER_SITE_OTHER): Payer: PPO

## 2017-05-26 ENCOUNTER — Encounter (INDEPENDENT_AMBULATORY_CARE_PROVIDER_SITE_OTHER): Payer: Self-pay | Admitting: Vascular Surgery

## 2017-05-26 VITALS — BP 143/75 | HR 68 | Resp 15 | Ht 64.0 in | Wt 187.0 lb

## 2017-05-26 DIAGNOSIS — M79604 Pain in right leg: Secondary | ICD-10-CM

## 2017-05-26 DIAGNOSIS — I890 Lymphedema, not elsewhere classified: Secondary | ICD-10-CM | POA: Diagnosis not present

## 2017-05-26 DIAGNOSIS — I872 Venous insufficiency (chronic) (peripheral): Secondary | ICD-10-CM

## 2017-05-26 DIAGNOSIS — M79605 Pain in left leg: Secondary | ICD-10-CM

## 2017-05-26 DIAGNOSIS — I739 Peripheral vascular disease, unspecified: Secondary | ICD-10-CM

## 2017-05-26 DIAGNOSIS — M4716 Other spondylosis with myelopathy, lumbar region: Secondary | ICD-10-CM | POA: Diagnosis not present

## 2017-05-26 NOTE — Progress Notes (Signed)
Subjective:    Patient ID: Ann Benson, female    DOB: 1940/08/29, 76 y.o.   MRN: [id number] Chief Complaint  Patient presents with  . Follow-up    Venous reflux   Patient presents to review vascular studies. She was initially seen on 04/26/2017 for evaluation of bilateral lower extremity pain. Since the initial visit, the patient has been engaging in conservative therapy including wearing medical grade 1 compression stockings, elevating her legs and remaining active with minimal improvement in her symptoms. The patient presents today with continued pain to the lower extremity. The patient's discomfort has progressive the point she is unable to function on a daily basis. Today the patient underwent a bilateral ABI which was notable for no significant lower extremity arterial disease. The bilateral toe brachial indices were normal. Bilateral tibials were triphasic. Patient underwent a bilateral lower extremity venous reflux exam which was notable for venous incompetence in the right common femoral vein, saphenous femoral junction and the origin/proximal great saphenous vein. Venous incompetence noted in the left common femoral vein and the saphenofemoral junction. No evidence of deep or superficial vein thrombosis in the bilateral lower extremities were noted. Denies any fever, nausea or vomiting.    Review of Systems  Constitutional: Negative.   HENT: Negative.   Eyes: Negative.   Respiratory: Negative.   Cardiovascular: Positive for leg swelling.       Lower extremity pain  Gastrointestinal: Negative.   Endocrine: Negative.   Genitourinary: Negative.   Musculoskeletal: Negative.   Skin: Negative.   Allergic/Immunologic: Negative.   Neurological: Negative.   Hematological: Negative.   Psychiatric/Behavioral: Negative.       Objective:   Physical Exam  Constitutional: She is oriented to person, place, and time. She appears well-developed and well-nourished. No distress.  HENT:   Head: Normocephalic and atraumatic.  Eyes: Pupils are equal, round, and reactive to light. Conjunctivae are normal.  Neck: Normal range of motion.  Cardiovascular: Normal rate, regular rhythm, normal heart sounds and intact distal pulses.   Pulses:      Radial pulses are 2+ on the right side, and 2+ on the left side.       Dorsalis pedis pulses are 2+ on the right side, and 2+ on the left side.       Posterior tibial pulses are 2+ on the right side, and 2+ on the left side.  Pulmonary/Chest: Effort normal.  Musculoskeletal: Normal range of motion. She exhibits edema (mild to moderate bilateral lower extremity edema noted).  Neurological: She is alert and oriented to person, place, and time.  Skin: Skin is warm and dry. She is not diaphoretic.  Psychiatric: She has a normal mood and affect. Her behavior is normal. Judgment and thought content normal.  Vitals reviewed.  BP (!) 143/75 (BP Location: Right Arm)   Pulse 68   Resp 15   Ht 5\' 4"  (1.626 m)   Wt 187 lb (84.8 kg)   BMI 32.10 kg/m   Past Medical History:  Diagnosis Date  . Arthritis   . Asthma    Social History   Social History  . Marital status: Married    Spouse name: N/A  . Number of children: N/A  . Years of education: N/A   Occupational History  . Not on file.   Social History Main Topics  . Smoking status: Never Smoker  . Smokeless tobacco: Never Used  . Alcohol use No  . Drug use: No  . Sexual activity: Not  on file   Other Topics Concern  . Not on file   Social History Narrative  . No narrative on file   Past Surgical History:  Procedure Laterality Date  . ABDOMINAL HYSTERECTOMY    . APPENDECTOMY    . BREAST BIOPSY Left 07/2013   neg  . CHOLECYSTECTOMY    . SHOULDER ARTHROSCOPY Left    Family History  Problem Relation Age of Onset  . Diabetes Mother   . Diabetes Brother   . Hypertension Brother   . Breast cancer Neg Hx    Allergies  Allergen Reactions  . Aspirin Other (See Comments)      Epitaxis   . Atorvastatin Other (See Comments)  . Statins Other (See Comments)  . Acetaminophen Palpitations      Assessment & Plan:  Patient presents to review vascular studies. She was initially seen on 04/26/2017 for evaluation of bilateral lower extremity pain. Since the initial visit, the patient has been engaging in conservative therapy including wearing medical grade 1 compression stockings, elevating her legs and remaining active with minimal improvement in her symptoms. The patient presents today with continued pain to the lower extremity. The patient's discomfort has progressive the point she is unable to function on a daily basis. Today the patient underwent a bilateral ABI which was notable for no significant lower extremity arterial disease. The bilateral toe brachial indices were normal. Bilateral tibials were triphasic. Patient underwent a bilateral lower extremity venous reflux exam which was notable for venous incompetence in the right common femoral vein, saphenous femoral junction and the origin/proximal great saphenous vein. Venous incompetence noted in the left common femoral vein and the saphenofemoral junction. No evidence of deep or superficial vein thrombosis in the bilateral lower extremities were noted. Denies any fever, nausea or vomiting.   1. Chronic venous insufficiency - New Patient with venous incompetence of the bilateral common femoral veins. Patient with incompetence of the right greater saphenous vein. Patient to continue conservative therapy at this time. She will follow up in 3 months to assess progress conservative therapy.  2. PAD (peripheral artery disease) (HCC) - stable There was no significant arterial disease noted on ABI today.  3. Lumbar spondylosis with myelopathy - stable Is is most likely a major contributor to the patient's lower extremity pain.  4. Lymphedema - New Despite conservative treatments including exercise, elevation and class I  compression stockings the patient still presents with stage one lymphedema. Patient would benefit greatly from the added therapy of a lymphedema pump. I will apply to her insurance today. Call her with insurance approval.  Current Outpatient Prescriptions on File Prior to Visit  Medication Sig Dispense Refill  . albuterol (PROAIR HFA) 108 (90 Base) MCG/ACT inhaler Inhale into the lungs.    . Cyanocobalamin (B-12) 5000 MCG CAPS Take by mouth daily.    . DULoxetine (CYMBALTA) 60 MG capsule TAKE 1 CAPSULE (60 MG TOTAL) BY MOUTH ONCE DAILY.  4  . gabapentin (NEURONTIN) 100 MG capsule gabapentin 100 mg capsule    . gabapentin (NEURONTIN) 300 MG capsule     . hydrochlorothiazide (HYDRODIURIL) 12.5 MG tablet Take 12.5 mg by mouth daily.    Marland Kitchen. oseltamivir (TAMIFLU) 30 MG capsule Take 1 capsule (30 mg total) by mouth 2 (two) times daily. 8 capsule 0  . pantoprazole (PROTONIX) 40 MG tablet Take by mouth.    . temazepam (RESTORIL) 30 MG capsule Take by mouth.    . triamterene-hydrochlorothiazide (MAXZIDE-25) 37.5-25 MG tablet triamterene 37.5 mg-hydrochlorothiazide  25 mg tablet    . Ascorbic Acid (VITAMIN C) 1000 MG tablet Take by mouth.    . baclofen (LIORESAL) 10 MG tablet Take 5 mg by mouth.    . Cholecalciferol (VITAMIN D3) 2000 units capsule Take by mouth.    Marland Kitchen guaiFENesin (ROBITUSSIN) 100 MG/5ML SOLN Take 10 mLs (200 mg total) by mouth every 6 (six) hours as needed for cough or to loosen phlegm. (Patient not taking: Reported on 05/26/2017) 1200 mL 0  . Pseudoephedrine-Guaifenesin (GUAIFENESIN 600/PSE 120 PO) Take by mouth.     No current facility-administered medications on file prior to visit.     There are no Patient Instructions on file for this visit. No Follow-up on file.   KIMBERLY A STEGMAYER, PA-C

## 2017-06-08 ENCOUNTER — Telehealth (INDEPENDENT_AMBULATORY_CARE_PROVIDER_SITE_OTHER): Payer: Self-pay

## 2017-06-08 NOTE — Telephone Encounter (Signed)
[facility] called [facility] let us know [facility] and she couldn't understand why her legs were not getting better. I let her know that she should elevate her legs above her heart and wear her compression hose daily. She stated she sits in a recliner so that should be enough.

## 2017-06-10 DIAGNOSIS — I890 Lymphedema, not elsewhere classified: Secondary | ICD-10-CM | POA: Diagnosis not present

## 2017-06-15 ENCOUNTER — Ambulatory Visit
Admission: RE | Admit: 2017-06-15 | Discharge: 2017-06-15 | Disposition: A | Discharge status: Home / Self Care | Payer: PPO | Source: Ambulatory Visit | Attending: Internal Medicine | Admitting: Internal Medicine

## 2017-06-15 DIAGNOSIS — N6002 Solitary cyst of left breast: Secondary | ICD-10-CM

## 2017-06-15 DIAGNOSIS — N6322 Unspecified lump in the left breast, upper inner quadrant: Secondary | ICD-10-CM | POA: Insufficient documentation

## 2017-06-15 DIAGNOSIS — N63 Unspecified lump in unspecified breast: Secondary | ICD-10-CM

## 2017-06-15 DIAGNOSIS — R922 Inconclusive mammogram: Secondary | ICD-10-CM | POA: Diagnosis not present

## 2017-06-15 DIAGNOSIS — R928 Other abnormal and inconclusive findings on diagnostic imaging of breast: Secondary | ICD-10-CM | POA: Diagnosis not present

## 2017-06-15 DIAGNOSIS — N6489 Other specified disorders of breast: Secondary | ICD-10-CM | POA: Diagnosis not present

## 2017-06-15 DIAGNOSIS — N6324 Unspecified lump in the left breast, lower inner quadrant: Secondary | ICD-10-CM | POA: Insufficient documentation

## 2017-07-26 ENCOUNTER — Encounter (INDEPENDENT_AMBULATORY_CARE_PROVIDER_SITE_OTHER): Payer: Self-pay | Admitting: Vascular Surgery

## 2017-07-26 ENCOUNTER — Ambulatory Visit (INDEPENDENT_AMBULATORY_CARE_PROVIDER_SITE_OTHER): Payer: PPO | Admitting: Vascular Surgery

## 2017-07-26 VITALS — BP 124/74 | HR 72 | Resp 16 | Wt 188.0 lb

## 2017-07-26 DIAGNOSIS — M79604 Pain in right leg: Secondary | ICD-10-CM | POA: Diagnosis not present

## 2017-07-26 DIAGNOSIS — I890 Lymphedema, not elsewhere classified: Secondary | ICD-10-CM | POA: Diagnosis not present

## 2017-07-26 DIAGNOSIS — M4716 Other spondylosis with myelopathy, lumbar region: Secondary | ICD-10-CM

## 2017-07-26 DIAGNOSIS — I872 Venous insufficiency (chronic) (peripheral): Secondary | ICD-10-CM | POA: Diagnosis not present

## 2017-07-26 DIAGNOSIS — M79605 Pain in left leg: Secondary | ICD-10-CM | POA: Diagnosis not present

## 2017-07-27 ENCOUNTER — Encounter (INDEPENDENT_AMBULATORY_CARE_PROVIDER_SITE_OTHER): Payer: Self-pay | Admitting: Vascular Surgery

## 2017-07-27 DIAGNOSIS — Z961 Presence of intraocular lens: Secondary | ICD-10-CM | POA: Diagnosis not present

## 2017-07-27 NOTE — Progress Notes (Signed)
MRN : [id number]  Ann Benson is a 76 y.o. (July 25, 1941) female who presents with chief complaint of  Chief Complaint  Patient presents with  . Follow-up    76mo discuss laser  .  History of Present Illness: Patient is seen for follow up evaluation of leg pain and leg swelling. The patient first noticed the swelling remotely. The swelling is associated with pain and discoloration. The pain and swelling worsens with prolonged dependency and improves with elevation. The pain is unrelated to activity and seems to radiate down the side of her leg from the hip to the ankle.  She notes it seems worse at night and prevents her from sleeping  The patient notes that in the morning the legs are significantly improved but they steadily worsened throughout the course of the day. The patient also notes a steady worsening of the discoloration in the ankle and shin area.   The patient denies claudication symptoms.  The patient denies symptoms consistent with rest pain.  The patient has a history of DJD of the LS spine disease.  The patient has no had any past angiography, interventions or vascular surgery.  Elevation makes the leg symptoms better, dependency makes them much worse. There is no history of ulcerations. The patient denies any recent changes in medications.  The patient has been wearing graduated compression but it has not help with the pain much.  Lymph pump seems to also be helpful with the swelling but not the pain.  The patient denies a history of DVT or PE. There is no prior history of phlebitis. There is no history of primary lymphedema.  No history of malignancies. No history of trauma or groin or pelvic surgery. There is no history of radiation treatment to the groin or pelvis  The patient denies amaurosis fugax or recent TIA symptoms. There are no recent neurological changes noted. The patient denies recent episodes of angina or shortness of breath  Current Meds  Medication  Sig  . albuterol (PROAIR HFA) 108 (90 Base) MCG/ACT inhaler Inhale into the lungs.  . Ascorbic Acid (VITAMIN C) 1000 MG tablet Take by mouth.  . baclofen (LIORESAL) 10 MG tablet Take 5 mg by mouth.  . Cholecalciferol (VITAMIN D3) 2000 units capsule Take by mouth.  . Cyanocobalamin (B-12) 5000 MCG CAPS Take by mouth daily.  . DULoxetine (CYMBALTA) 60 MG capsule TAKE 1 CAPSULE (60 MG TOTAL) BY MOUTH ONCE DAILY.  . furosemide (LASIX) 20 MG tablet Take by mouth.  . gabapentin (NEURONTIN) 100 MG capsule gabapentin 100 mg capsule  . gabapentin (NEURONTIN) 300 MG capsule   . guaiFENesin (ROBITUSSIN) 100 MG/5ML SOLN Take 10 mLs (200 mg total) by mouth every 6 (six) hours as needed for cough or to loosen phlegm.  . hydrochlorothiazide (HYDRODIURIL) 12.5 MG tablet Take 12.5 mg by mouth daily.  Marland Kitchen. oseltamivir (TAMIFLU) 30 MG capsule Take 1 capsule (30 mg total) by mouth 2 (two) times daily.  . pantoprazole (PROTONIX) 40 MG tablet Take by mouth.  . potassium chloride (KLOR-CON M10) 10 MEQ tablet Take by mouth.  . Pseudoephedrine-Guaifenesin (GUAIFENESIN 600/PSE 120 PO) Take by mouth.  . temazepam (RESTORIL) 30 MG capsule Take by mouth.  . traMADol (ULTRAM) 50 MG tablet 1/2-1 po bid prn  . triamterene-hydrochlorothiazide (MAXZIDE-25) 37.5-25 MG tablet triamterene 37.5 mg-hydrochlorothiazide 25 mg tablet    Past Medical History:  Diagnosis Date  . Arthritis   . Asthma     Past Surgical History:  Procedure Laterality  Date  . ABDOMINAL HYSTERECTOMY    . APPENDECTOMY    . BREAST BIOPSY Left 07/2013   neg  . CHOLECYSTECTOMY    . SHOULDER ARTHROSCOPY Left     Social History Social History   Tobacco Use  . Smoking status: Never Smoker  . Smokeless tobacco: Never Used  Substance Use Topics  . Alcohol use: No    Alcohol/week: 0.0 oz  . Drug use: No    Family History Family History  Problem Relation Age of Onset  . Diabetes Mother   . Diabetes Brother   . Hypertension Brother   .  Breast cancer Neg Hx     Allergies  Allergen Reactions  . Aspirin Other (See Comments)    Epitaxis   . Atorvastatin Other (See Comments)  . Statins Other (See Comments)  . Acetaminophen Palpitations     REVIEW OF SYSTEMS (Negative unless checked)  Constitutional: [] Weight loss  [] Fever  [] Chills Cardiac: [] Chest pain   [] Chest pressure   [] Palpitations   [] Shortness of breath when laying flat   [] Shortness of breath with exertion. Vascular:  [] Pain in legs with walking   [x] Pain in legs at rest  [] History of DVT   [] Phlebitis   [x] Swelling in legs   [] Varicose veins   [] Non-healing ulcers Pulmonary:   [] Uses home oxygen   [] Productive cough   [] Hemoptysis   [] Wheeze  [] COPD   [] Asthma Neurologic:  [] Dizziness   [] Seizures   [] History of stroke   [] History of TIA  [] Aphasia   [] Vissual changes   [] Weakness or numbness in arm   [] Weakness or numbness in leg Musculoskeletal:   [x] Joint swelling   [] Joint pain   [x] Low back pain Hematologic:  [] Easy bruising  [] Easy bleeding   [] Hypercoagulable state   [] Anemic Gastrointestinal:  [] Diarrhea   [] Vomiting  [] Gastroesophageal reflux/heartburn   [] Difficulty swallowing. Genitourinary:  [] Chronic kidney disease   [] Difficult urination  [] Frequent urination   [] Blood in urine Skin:  [] Rashes   [] Ulcers  Psychological:  [] History of anxiety   []  History of major depression.  Physical Examination  Vitals:   07/26/17 1045  BP: 124/74  Pulse: 72  Resp: 16  Weight: 85.3 kg (188 lb)   Body mass index is 32.27 kg/m. Gen: WD/WN, NAD Head: [place]/AT, No temporalis wasting.  Ear/Nose/Throat: Hearing grossly intact, nares w/o erythema or drainage Eyes: PER, EOMI, sclera nonicteric.  Neck: Supple, no large masses.   Pulmonary:  Good air movement, no audible wheezing bilaterally, no use of accessory muscles.  Cardiac: RRR, no JVD Vascular:  Scattered small varicosities present bilaterally.  Mild venous stasis changes to the legs bilaterally.  3+  soft pitting edema Vessel Right Left  Radial Palpable Palpable  PT Palpable Palpable  DP Palpable Palpable  Gastrointestinal: Non-distended. No guarding/no peritoneal signs.  Musculoskeletal: M/S 5/5 throughout.  No deformity or atrophy.  Neurologic: CN 2-12 intact. Symmetrical.  Speech is fluent. Motor exam as listed above. Psychiatric: Judgment intact, Mood & affect appropriate for pt's clinical situation. Dermatologic: No rashes or ulcers noted.  No changes consistent with cellulitis. Lymph : No lichenification or skin changes of chronic lymphedema.  CBC Lab Results  Component Value Date   WBC 6.1 07/19/2016   HGB 14.7 07/19/2016   HCT 43.3 07/19/2016   MCV 88.2 07/19/2016   PLT 163 07/19/2016    BMET    Component Value Date/Time   NA 133 (L) 07/19/2016 1413   NA 137 12/31/2013 0509   K 3.6  07/19/2016 1413   K 4.1 12/31/2013 0509   CL 97 (L) 07/19/2016 1413   CL 103 12/31/2013 0509   CO2 26 07/19/2016 1413   CO2 27 12/31/2013 0509   GLUCOSE 112 (H) 07/19/2016 1413   GLUCOSE 96 12/31/2013 0509   BUN 16 07/19/2016 1413   BUN 14 12/31/2013 0509   CREATININE 1.24 (H) 07/19/2016 1413   CREATININE 1.02 12/31/2013 0509   CALCIUM 8.7 (L) 07/19/2016 1413   CALCIUM 8.8 12/31/2013 0509   GFRNONAA 41 (L) 07/19/2016 1413   GFRNONAA 54 (L) 12/31/2013 0509   GFRAA 47 (L) 07/19/2016 1413   GFRAA >60 12/31/2013 0509   CrCl cannot be calculated (Patient's most recent lab result is older than the maximum 21 days allowed.).  COAG No results found for: INR, PROTIME  Radiology No results found.    Assessment/Plan 1. Pain in both lower extremities Recommend:  I do not find evidence of life style limiting vascular disease. The patient specifically denies life style limitation.  Previous noninvasive studies including ABI's of the legs do not identify critical vascular problems.  The patient should continue walking and begin a more formal exercise program. The patient should  continue his antiplatelet therapy and aggressive treatment of the lipid abnormalities.  The patient should begin wearing graduated compression socks 15-20 mmHg strength to control her mild edema.  Patient will follow-up with me in 3 months  2. Lymphedema  No surgery or intervention at this point in time.    I have reviewed my discussion with the patient regarding lymphedema and why it  causes symptoms.  Patient will continue wearing graduated compression stockings class 1 (20-30 mmHg) on a daily basis a prescription was given. The patient is reminded to put the stockings on first thing in the morning and removing them in the evening. The patient is instructed specifically not to sleep in the stockings.   In addition, behavioral modification throughout the day will be continued.  This will include frequent elevation (such as in a recliner), use of over the counter pain medications as needed and exercise such as walking.  I have reviewed systemic causes for chronic edema such as liver, kidney and cardiac etiologies and there does not appear to be any significant changes in these organ systems over the past year.  The patient is under the impression that these organ systems are all stable and unchanged.    The patient will continue aggressive use of the  lymph pump.  This will continue to improve the edema control and prevent sequela such as ulcers and infections.   The patient will follow-up with me on an annual basis.    3. Chronic venous insufficiency  No surgery or intervention at this point in time.    I have reviewed my discussion with the patient regarding lymphedema and why it  causes symptoms.  Patient will continue wearing graduated compression stockings class 1 (20-30 mmHg) on a daily basis a prescription was given. The patient is reminded to put the stockings on first thing in the morning and removing them in the evening. The patient is instructed specifically not to sleep in the  stockings.   In addition, behavioral modification throughout the day will be continued.  This will include frequent elevation (such as in a recliner), use of over the counter pain medications as needed and exercise such as walking.  I have reviewed systemic causes for chronic edema such as liver, kidney and cardiac etiologies and there does not  appear to be any significant changes in these organ systems over the past year.  The patient is under the impression that these organ systems are all stable and unchanged.    The patient will continue aggressive use of the  lymph pump.  This will continue to improve the edema control and prevent sequela such as ulcers and infections.   The patient will follow-up with me on an annual basis.    4. Lumbar spondylosis with myelopathy I believe this is the most likely cause of her pain and will defer further workup to the primary service    Levora Dredge, MD  07/27/2017 11:34 AM

## 2017-07-29 DIAGNOSIS — G608 Other hereditary and idiopathic neuropathies: Secondary | ICD-10-CM | POA: Diagnosis not present

## 2017-07-29 DIAGNOSIS — R4189 Other symptoms and signs involving cognitive functions and awareness: Secondary | ICD-10-CM | POA: Diagnosis not present

## 2017-07-29 DIAGNOSIS — R600 Localized edema: Secondary | ICD-10-CM | POA: Diagnosis not present

## 2017-09-21 DIAGNOSIS — M5410 Radiculopathy, site unspecified: Secondary | ICD-10-CM | POA: Diagnosis not present

## 2017-09-21 DIAGNOSIS — L819 Disorder of pigmentation, unspecified: Secondary | ICD-10-CM | POA: Diagnosis not present

## 2017-10-14 DIAGNOSIS — N183 Chronic kidney disease, stage 3 (moderate): Secondary | ICD-10-CM | POA: Diagnosis not present

## 2017-10-14 DIAGNOSIS — R600 Localized edema: Secondary | ICD-10-CM | POA: Diagnosis not present

## 2017-10-25 ENCOUNTER — Ambulatory Visit (INDEPENDENT_AMBULATORY_CARE_PROVIDER_SITE_OTHER): Payer: PPO | Admitting: Vascular Surgery

## 2017-10-26 ENCOUNTER — Encounter (INDEPENDENT_AMBULATORY_CARE_PROVIDER_SITE_OTHER): Payer: Self-pay | Admitting: Vascular Surgery

## 2017-10-29 DIAGNOSIS — M546 Pain in thoracic spine: Secondary | ICD-10-CM | POA: Diagnosis not present

## 2017-10-29 DIAGNOSIS — M5116 Intervertebral disc disorders with radiculopathy, lumbar region: Secondary | ICD-10-CM | POA: Diagnosis not present

## 2017-10-29 DIAGNOSIS — M5136 Other intervertebral disc degeneration, lumbar region: Secondary | ICD-10-CM | POA: Diagnosis not present

## 2017-10-29 DIAGNOSIS — M545 Low back pain: Secondary | ICD-10-CM | POA: Diagnosis not present

## 2017-10-29 DIAGNOSIS — M7918 Myalgia, other site: Secondary | ICD-10-CM | POA: Diagnosis not present

## 2017-11-01 ENCOUNTER — Ambulatory Visit: Payer: PPO | Admitting: Urology

## 2017-11-01 ENCOUNTER — Encounter: Payer: Self-pay | Admitting: Urology

## 2017-11-01 VITALS — BP 131/79 | HR 79 | Ht 64.0 in | Wt 183.8 lb

## 2017-11-01 DIAGNOSIS — R32 Unspecified urinary incontinence: Secondary | ICD-10-CM

## 2017-11-01 DIAGNOSIS — N3946 Mixed incontinence: Secondary | ICD-10-CM

## 2017-11-01 LAB — URINALYSIS, COMPLETE
Bilirubin, UA: NEGATIVE
Glucose, UA: NEGATIVE
Ketones, UA: NEGATIVE
Leukocytes, UA: NEGATIVE
Nitrite, UA: NEGATIVE
Protein, UA: NEGATIVE
RBC, UA: NEGATIVE
Specific Gravity, UA: 1.02 (ref 1.005–1.030)
Urobilinogen, Ur: 0.2 mg/dL (ref 0.2–1.0)
pH, UA: 7 (ref 5.0–7.5)

## 2017-11-01 LAB — MICROSCOPIC EXAMINATION
Bacteria, UA: NONE SEEN
RBC, UA: NONE SEEN /hpf (ref 0–2)
WBC, UA: NONE SEEN /hpf (ref 0–5)

## 2017-11-01 MED ORDER — MIRABEGRON ER 50 MG PO TB24: 50.0000 mg | ORAL_TABLET | Freq: Every day | ORAL | 11 refills | Status: DC

## 2017-11-01 NOTE — Progress Notes (Signed)
11/01/2017 1:25 PM   Ann Benson April 01, 1941 [id number]  Referring provider: Lynnea FerrierKlein, Bert J III, MD [address] [facility] [place], [place] [id number]  Chief Complaint  Patient presents with  . Urinary Incontinence    HPI: I was consulted to assess the patient's urinary incontinence worsening over many years.  She leaks with coughing sneezing bending lifting.  She has urge incontinence.  Both are significant.  She has bedwetting.  She soaks 3 or 4 pads a day.  She voids every 2 hours and gets up 3-4 times a night to urinate.  She has chronic renal insufficiency.  She denies a history of previous GU surgery.  She passed a kidney stone.  She is not certain she has bladder infections.  She has had a hysterectomy.  She is prone to constipation.  The presentation is not been medically treated  Modifying factors: There are no other modifying factors  Associated signs and symptoms: There are no other associated signs and symptoms Aggravating and relieving factors: There are no other aggravating or relieving factors Severity: Moderate Duration: Persistent   PMH: Past Medical History:  Diagnosis Date  . Arthritis   . Asthma     Surgical History: Past Surgical History:  Procedure Laterality Date  . ABDOMINAL HYSTERECTOMY    . APPENDECTOMY    . BREAST BIOPSY Left 07/2013   neg  . CHOLECYSTECTOMY    . SHOULDER ARTHROSCOPY Left     Home Medications:  Allergies as of 11/01/2017      Reactions   Aspirin Other (See Comments)   Epitaxis    Atorvastatin Other (See Comments)   Statins Other (See Comments)   Acetaminophen Palpitations      Medication List        Accurate as of 11/01/17  1:25 PM. Always use your most recent med list.          B-12 5000 MCG Caps Take by mouth daily.   baclofen 10 MG tablet Commonly known as:  LIORESAL Take 5 mg by mouth.   DULoxetine 60 MG capsule Commonly known as:  CYMBALTA TAKE 1 CAPSULE (60 MG TOTAL) BY  MOUTH ONCE DAILY.   gabapentin 100 MG capsule Commonly known as:  NEURONTIN gabapentin 100 mg capsule   gabapentin 300 MG capsule Commonly known as:  NEURONTIN   pantoprazole 40 MG tablet Commonly known as:  PROTONIX Take by mouth.   PROAIR HFA 108 (90 Base) MCG/ACT inhaler Generic drug:  albuterol Inhale into the lungs.   temazepam 30 MG capsule Commonly known as:  RESTORIL Take by mouth.   triamterene-hydrochlorothiazide 37.5-25 MG tablet Commonly known as:  MAXZIDE-25 triamterene 37.5 mg-hydrochlorothiazide 25 mg tablet   vitamin C 1000 MG tablet Take by mouth.   Vitamin D3 2000 units capsule Take by mouth.       Allergies:  Allergies  Allergen Reactions  . Aspirin Other (See Comments)    Epitaxis   . Atorvastatin Other (See Comments)  . Statins Other (See Comments)  . Acetaminophen Palpitations    Family History: Family History  Problem Relation Age of Onset  . Diabetes Mother   . Diabetes Brother   . Hypertension Brother   . Breast cancer Neg Hx     Social History:  reports that she has never smoked. She has never used smokeless tobacco. She reports that she does not drink alcohol or use drugs.  ROS: UROLOGY Frequent Urination?: No Hard to postpone urination?: Yes Burning/pain with urination?:  Yes Get up at night to urinate?: Yes Leakage of urine?: Yes Urine stream starts and stops?: Yes Trouble starting stream?: Yes Do you have to strain to urinate?: No Blood in urine?: No Urinary tract infection?: Yes Sexually transmitted disease?: No Injury to kidneys or bladder?: No Painful intercourse?: No Weak stream?: No Currently pregnant?: No Vaginal bleeding?: No Last menstrual period?: n  Gastrointestinal Nausea?: Yes Vomiting?: No Indigestion/heartburn?: Yes Diarrhea?: No Constipation?: Yes  Constitutional Fever: No Night sweats?: No Weight loss?: No Fatigue?: Yes  Skin Skin rash/lesions?: No Itching?: Yes  Eyes Blurred  vision?: No Double vision?: No  Ears/Nose/Throat Sore throat?: No Sinus problems?: Yes  Hematologic/Lymphatic Swollen glands?: No Easy bruising?: Yes  Cardiovascular Leg swelling?: Yes Chest pain?: No  Respiratory Cough?: Yes Shortness of breath?: Yes  Endocrine Excessive thirst?: No  Musculoskeletal Back pain?: Yes Joint pain?: Yes  Neurological Headaches?: No Dizziness?: Yes  Psychologic Depression?: Yes Anxiety?: No  Physical Exam: BP 131/79 (BP Location: Right Arm, Patient Position: Sitting, Cuff Size: Large)   Pulse 79   Ht 5\' 4"  (1.626 m)   Wt 183 lb 12.8 oz (83.4 kg)   BMI 31.55 kg/m   Constitutional:  Alert and oriented, No acute distress. HEENT: [place] AT, moist mucus membranes.  Trachea midline, no masses. Cardiovascular: No clubbing, cyanosis, or edema. Respiratory: Normal respiratory effort, no increased work of breathing. GI: Abdomen is soft, nontender, nondistended, no abdominal masses GU: On pelvic examination the patient had grade 1 hypermobility of the bladder neck and a negative cough test.  She did not of prolapse.  She cannot cough hard.  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  I do not think she had lichen sclerosis but there may be an element of such Skin: No rashes, bruises or suspicious lesions. Lymph: No cervical or inguinal adenopathy. Neurologic: Grossly intact, no focal deficits, moving all 4 extremities. Psychiatric: Normal mood and affect.  Laboratory Data: Lab Results  Component Value Date   WBC 6.1 07/19/2016   HGB 14.7 07/19/2016   HCT 43.3 07/19/2016   MCV 88.2 07/19/2016   PLT 163 07/19/2016    Lab Results  Component Value Date   CREATININE 1.24 (H) 07/19/2016    No results found for: PSA  No results found for: TESTOSTERONE  No results found for: HGBA1C  Urinalysis    Component Value Date/Time   COLORURINE YELLOW (A) 07/19/2016 1620   APPEARANCEUR CLEAR (A) 07/19/2016 1620   LABSPEC  1.012 07/19/2016 1620   PHURINE 6.0 07/19/2016 1620   GLUCOSEU NEGATIVE 07/19/2016 1620   HGBUR NEGATIVE 07/19/2016 1620   BILIRUBINUR NEGATIVE 07/19/2016 1620   KETONESUR NEGATIVE 07/19/2016 1620   PROTEINUR NEGATIVE 07/19/2016 1620   NITRITE NEGATIVE 07/19/2016 1620   LEUKOCYTESUR NEGATIVE 07/19/2016 1620    Pertinent Imaging: none  Assessment & Plan: The patient has mixed incontinence and bedwetting.  She has frequency and nocturia.  I will send the patient's urine for culture.  I decided to hold off on urodynamics at this stage but may need them in the future.  I would like to try the patient on Myrbetriq 50 mg and have her come back in a month for cystoscopy.  I will call the culture is positive.  There are no diagnoses linked to this encounter.  No follow-ups on file.  Martina Sinner, MD  [facility] [address], [address] [place], [place] [address] [phone]

## 2017-11-30 DIAGNOSIS — Z780 Asymptomatic menopausal state: Secondary | ICD-10-CM | POA: Diagnosis not present

## 2017-11-30 DIAGNOSIS — M5136 Other intervertebral disc degeneration, lumbar region: Secondary | ICD-10-CM | POA: Insufficient documentation

## 2017-11-30 DIAGNOSIS — G63 Polyneuropathy in diseases classified elsewhere: Secondary | ICD-10-CM | POA: Diagnosis not present

## 2017-11-30 DIAGNOSIS — N183 Chronic kidney disease, stage 3 (moderate): Secondary | ICD-10-CM | POA: Diagnosis not present

## 2017-11-30 DIAGNOSIS — E7849 Other hyperlipidemia: Secondary | ICD-10-CM | POA: Diagnosis not present

## 2017-11-30 DIAGNOSIS — R600 Localized edema: Secondary | ICD-10-CM | POA: Diagnosis not present

## 2017-11-30 DIAGNOSIS — I10 Essential (primary) hypertension: Secondary | ICD-10-CM | POA: Diagnosis not present

## 2017-11-30 DIAGNOSIS — K219 Gastro-esophageal reflux disease without esophagitis: Secondary | ICD-10-CM | POA: Diagnosis not present

## 2017-11-30 DIAGNOSIS — F419 Anxiety disorder, unspecified: Secondary | ICD-10-CM | POA: Diagnosis not present

## 2017-11-30 DIAGNOSIS — R7309 Other abnormal glucose: Secondary | ICD-10-CM | POA: Diagnosis not present

## 2017-11-30 DIAGNOSIS — F3342 Major depressive disorder, recurrent, in full remission: Secondary | ICD-10-CM | POA: Diagnosis not present

## 2017-11-30 DIAGNOSIS — I739 Peripheral vascular disease, unspecified: Secondary | ICD-10-CM | POA: Diagnosis not present

## 2017-11-30 DIAGNOSIS — I700 Atherosclerosis of aorta: Secondary | ICD-10-CM | POA: Diagnosis not present

## 2017-12-06 ENCOUNTER — Encounter: Payer: Self-pay | Admitting: Urology

## 2017-12-06 ENCOUNTER — Ambulatory Visit: Payer: PPO | Admitting: Urology

## 2017-12-06 VITALS — BP 151/75 | HR 81 | Resp 16 | Ht 64.0 in | Wt 180.0 lb

## 2017-12-06 DIAGNOSIS — N3946 Mixed incontinence: Secondary | ICD-10-CM

## 2017-12-06 DIAGNOSIS — R32 Unspecified urinary incontinence: Secondary | ICD-10-CM | POA: Diagnosis not present

## 2017-12-06 LAB — MICROSCOPIC EXAMINATION: RBC, UA: NONE SEEN /hpf (ref 0–2)

## 2017-12-06 LAB — URINALYSIS, COMPLETE
Bilirubin, UA: NEGATIVE
Glucose, UA: NEGATIVE
Ketones, UA: NEGATIVE
Nitrite, UA: NEGATIVE
Protein, UA: NEGATIVE
RBC, UA: NEGATIVE
Specific Gravity, UA: 1.02 (ref 1.005–1.030)
Urobilinogen, Ur: 1 mg/dL (ref 0.2–1.0)
pH, UA: 7.5 (ref 5.0–7.5)

## 2017-12-06 MED ORDER — TOLTERODINE TARTRATE ER 4 MG PO CP24: 4.0000 mg | ORAL_CAPSULE | Freq: Every day | ORAL | 11 refills | Status: DC

## 2017-12-06 NOTE — Progress Notes (Signed)
12/06/2017 2:37 PM   Pilar Plate 10-Jul-1941 [id number]  Referring provider: Lynnea Ferrier, MD [address] [facility] [place], [place] [address]  No chief complaint on file.   HPI: I was consulted to assess the patient's urinary incontinence worsening over many years.  She leaks with coughing sneezing bending lifting.  She has urge incontinence.  Both are significant.  She has bedwetting.  She soaks 3 or 4 pads a day.  She voids every 2 hours and gets up 3-4 times a night to urinate.  She has chronic renal insufficiency.   On pelvic examination the patient had grade 1 hypermobility of the bladder neck and a negative cough test.  She did not of prolapse.  She cannot cough hard.  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  I do not think she had lichen sclerosis but there may be an element of such  The patient has mixed incontinence and bedwetting.  She has frequency and nocturia.  I will send the patient's urine for culture.  I decided to hold off on urodynamics at this stage but may need them in the future.  I would like to try the patient on Myrbetriq 50 mg and have her come back in a month for cystoscopy.  I will call the culture is positive.  Today I did not see a culture sent last time.  Incontinence has persisted in spite of taking Myrbetriq.  Frequency stable  After consent she underwent flexible cystoscopy utilizing sterile technique.  The bladder mucosa and trigone were normal.  There is no stitch foreign body or carcinoma.  There is no cystitis.  She tolerated the procedure well  PMH: Past Medical History:  Diagnosis Date  . Arthritis   . Asthma     Surgical History: Past Surgical History:  Procedure Laterality Date  . ABDOMINAL HYSTERECTOMY    . APPENDECTOMY    . BREAST BIOPSY Left 07/2013   neg  . CHOLECYSTECTOMY    . SHOULDER ARTHROSCOPY Left     Home Medications:  Allergies as of 12/06/2017    Reactions   Aspirin Other (See Comments)   Epitaxis    Atorvastatin Other (See Comments)   Mirabegron Itching   Statins Other (See Comments)   Acetaminophen Palpitations      Medication List        Accurate as of 12/06/17  2:37 PM. Always use your most recent med list.          B-12 5000 MCG Caps Take by mouth daily.   baclofen 10 MG tablet Commonly known as:  LIORESAL Take 5 mg by mouth.   DULoxetine 60 MG capsule Commonly known as:  CYMBALTA TAKE 1 CAPSULE (60 MG TOTAL) BY MOUTH ONCE DAILY.   gabapentin 100 MG capsule Commonly known as:  NEURONTIN gabapentin 100 mg capsule   gabapentin 300 MG capsule Commonly known as:  NEURONTIN   mirabegron ER 50 MG Tb24 tablet Commonly known as:  MYRBETRIQ Take 1 tablet (50 mg total) by mouth daily.   pantoprazole 40 MG tablet Commonly known as:  PROTONIX Take by mouth.   PROAIR HFA 108 (90 Base) MCG/ACT inhaler Generic drug:  albuterol Inhale into the lungs.   temazepam 30 MG capsule Commonly known as:  RESTORIL Take by mouth.   triamterene-hydrochlorothiazide 37.5-25 MG tablet Commonly known as:  MAXZIDE-25 triamterene 37.5 mg-hydrochlorothiazide 25 mg tablet   vitamin C 1000 MG tablet Take by mouth.  Vitamin D3 2000 units capsule Take by mouth.       Allergies:  Allergies  Allergen Reactions  . Aspirin Other (See Comments)    Epitaxis   . Atorvastatin Other (See Comments)  . Mirabegron Itching  . Statins Other (See Comments)  . Acetaminophen Palpitations    Family History: Family History  Problem Relation Age of Onset  . Diabetes Mother   . Diabetes Brother   . Hypertension Brother   . Breast cancer Neg Hx     Social History:  reports that she has never smoked. She has never used smokeless tobacco. She reports that she does not drink alcohol or use drugs.  ROS:                                        Physical Exam: BP (!) 151/75   Pulse 81   Resp 16   Ht   (1.626 m)   Wt 180 lb (81.6 kg)   SpO2 98%   BMI 30.90 kg/m    Laboratory Data: Lab Results  Component Value Date   WBC 6.1 07/19/2016   HGB 14.7 07/19/2016   HCT 43.3 07/19/2016   MCV 88.2 07/19/2016   PLT 163 07/19/2016    Lab Results  Component Value Date   CREATININE 1.24 (H) 07/19/2016    No results found for: PSA  No results found for: TESTOSTERONE  No results found for: HGBA1C  Urinalysis    Component Value Date/Time   COLORURINE YELLOW (A) 07/19/2016 1620   APPEARANCEUR Clear 11/01/2017 1318   LABSPEC 1.012 07/19/2016 1620   PHURINE 6.0 07/19/2016 1620   GLUCOSEU Negative 11/01/2017 1318   HGBUR NEGATIVE 07/19/2016 1620   BILIRUBINUR Negative 11/01/2017 1318   KETONESUR NEGATIVE 07/19/2016 1620   PROTEINUR Negative 11/01/2017 1318   PROTEINUR NEGATIVE 07/19/2016 1620   NITRITE Negative 11/01/2017 1318   NITRITE NEGATIVE 07/19/2016 1620   LEUKOCYTESUR Negative 11/01/2017 1318    Pertinent Imaging:   Assessment & Plan: The patient has mixed incontinence and bedwetting.  I will try one more medication.  She understands that I will order urodynamics if she is not dramatically better next time.  I will send the urine for culture. Detrol sent  1. Urinary incontinence, unspecified type 2.  Mixed urinary incontinence - Urinalysis, Complete   No follow-ups on file.  Martina Sinner, MD  [facility] [address], [address] [place], [place] [address] [phone]

## 2017-12-07 DIAGNOSIS — M8588 Other specified disorders of bone density and structure, other site: Secondary | ICD-10-CM | POA: Diagnosis not present

## 2017-12-07 DIAGNOSIS — Z780 Asymptomatic menopausal state: Secondary | ICD-10-CM | POA: Diagnosis not present

## 2017-12-08 LAB — CULTURE, URINE COMPREHENSIVE

## 2017-12-14 DIAGNOSIS — K219 Gastro-esophageal reflux disease without esophagitis: Secondary | ICD-10-CM | POA: Diagnosis not present

## 2017-12-14 DIAGNOSIS — M810 Age-related osteoporosis without current pathological fracture: Secondary | ICD-10-CM | POA: Diagnosis not present

## 2017-12-30 DIAGNOSIS — R1310 Dysphagia, unspecified: Secondary | ICD-10-CM | POA: Diagnosis not present

## 2017-12-30 DIAGNOSIS — R1111 Vomiting without nausea: Secondary | ICD-10-CM | POA: Diagnosis not present

## 2017-12-30 DIAGNOSIS — K219 Gastro-esophageal reflux disease without esophagitis: Secondary | ICD-10-CM | POA: Diagnosis not present

## 2017-12-30 DIAGNOSIS — R1084 Generalized abdominal pain: Secondary | ICD-10-CM | POA: Diagnosis not present

## 2017-12-31 ENCOUNTER — Other Ambulatory Visit: Payer: Self-pay | Admitting: Family Medicine

## 2017-12-31 DIAGNOSIS — R1310 Dysphagia, unspecified: Secondary | ICD-10-CM

## 2017-12-31 DIAGNOSIS — R1111 Vomiting without nausea: Secondary | ICD-10-CM

## 2018-01-12 ENCOUNTER — Ambulatory Visit: Payer: PPO | Admitting: Podiatry

## 2018-01-14 DIAGNOSIS — R238 Other skin changes: Secondary | ICD-10-CM | POA: Diagnosis not present

## 2018-01-14 DIAGNOSIS — M79671 Pain in right foot: Secondary | ICD-10-CM | POA: Diagnosis not present

## 2018-01-14 DIAGNOSIS — M79674 Pain in right toe(s): Secondary | ICD-10-CM | POA: Diagnosis not present

## 2018-01-14 DIAGNOSIS — L03031 Cellulitis of right toe: Secondary | ICD-10-CM | POA: Diagnosis not present

## 2018-01-17 ENCOUNTER — Encounter: Payer: Self-pay | Admitting: Urology

## 2018-01-17 ENCOUNTER — Encounter: Payer: Self-pay | Admitting: Podiatry

## 2018-01-17 ENCOUNTER — Ambulatory Visit: Payer: PPO | Admitting: Podiatry

## 2018-01-17 ENCOUNTER — Ambulatory Visit: Payer: PPO | Admitting: Urology

## 2018-01-17 VITALS — BP 115/69 | HR 85 | Ht 63.0 in | Wt 182.0 lb

## 2018-01-17 DIAGNOSIS — K589 Irritable bowel syndrome without diarrhea: Secondary | ICD-10-CM | POA: Insufficient documentation

## 2018-01-17 DIAGNOSIS — L600 Ingrowing nail: Secondary | ICD-10-CM

## 2018-01-17 DIAGNOSIS — G43909 Migraine, unspecified, not intractable, without status migrainosus: Secondary | ICD-10-CM | POA: Insufficient documentation

## 2018-01-17 DIAGNOSIS — F419 Anxiety disorder, unspecified: Secondary | ICD-10-CM | POA: Insufficient documentation

## 2018-01-17 DIAGNOSIS — N3946 Mixed incontinence: Secondary | ICD-10-CM

## 2018-01-17 DIAGNOSIS — K469 Unspecified abdominal hernia without obstruction or gangrene: Secondary | ICD-10-CM | POA: Insufficient documentation

## 2018-01-17 DIAGNOSIS — E785 Hyperlipidemia, unspecified: Secondary | ICD-10-CM | POA: Insufficient documentation

## 2018-01-17 MED ORDER — NEOMYCIN-POLYMYXIN-HC 1 % OT SOLN: OTIC | 1 refills | Status: DC

## 2018-01-17 NOTE — Patient Instructions (Signed)

## 2018-01-17 NOTE — Progress Notes (Signed)
She presents today chief complaint of painful hallux nails bilateral she states that the ribs are tender around the nail and is requesting the nails to be removed.  Objective: Vital signs are stable she is alert and oriented x3.  Pulses are palpable.  Neurologic sensorium is intact.  Degenerative flexors are intact.  Muscle strength is 5/5 dorsiflexors plantar flexors inverters everters onto the musculature is intact.  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  Assessment: Ingrown nails hallux bilateral.  Plan: Removal of hallux nails plates temporarily bilateral.  This is performed after 3 cc of a 50-50 mixture of Marcaine plain lidocaine plain was infiltrated in a hallux block bilaterally.  She tolerated procedure well without complications.  She received both oral and written home-going instruction for the care and soaking of her toe as well as a prescription for Cortisporin Otic to be applied.  I will follow-up with her in 2 weeks.  Should she have questions or concerns she will notify us immediately.

## 2018-01-17 NOTE — Progress Notes (Signed)
01/17/2018 3:11 PM   Ann Benson 03/25/1941 [id number]  Referring provider: Lynnea FerrierKlein, Bert J III, MD [address] [facility] [place], [place] [address]  Chief Complaint  Patient presents with  . Urinary Incontinence    5wk follow up    HPI: I was consulted to assess the patient's urinary incontinence worsening over many years. She leaks with coughing sneezing bending lifting. She has urge incontinence. Both are significant. She has bedwetting. She soaks 3 or 4 pads a day.  She voids every 2 hours and gets up 3-4 times a night to urinate.  She has chronic renal insufficiency.   On pelvic examination the patient had grade 1 hypermobility of the bladder neck and a negative cough test. She did not of prolapse. She cannot cough hard. 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 I do not think she had lichen sclerosis but there may be an element of such  The patient has mixed incontinence and bedwetting. She has frequency and nocturia. I decided to hold off on urodynamics at this stage but may need them in the future.   I did not see a culture sent last time.  Incontinence has persisted in spite of taking Myrbetriq.    Cysto: normal  Today The patient has mixed incontinence and bedwetting.  My plan was to order urodynamics if she did not respond to the second pill Detrol.  The patient is a partial responder but still has urgency incontinence and bedwetting in frequency stable.   PMH: Past Medical History:  Diagnosis Date  . Arthritis   . Asthma     Surgical History: Past Surgical History:  Procedure Laterality Date  . ABDOMINAL HYSTERECTOMY    . APPENDECTOMY    . BREAST BIOPSY Left 07/2013   neg  . CHOLECYSTECTOMY    . SHOULDER ARTHROSCOPY Left     Home Medications:  Allergies as of 01/17/2018      Reactions   Aspirin Other (See Comments)   Epitaxis    Atorvastatin Other (See Comments)   Mirabegron Itching     Statins Other (See Comments)   Acetaminophen Palpitations      Medication List        Accurate as of 01/17/18  3:11 PM. Always use your most recent med list.          B-12 5000 MCG Caps Take by mouth daily.   baclofen 10 MG tablet Commonly known as:  LIORESAL Take 5 mg by mouth.   Calcium Carbonate-Vitamin D 600-400 MG-UNIT tablet Take by mouth.   [facility]S BISACODYL 10 MG suppository Generic drug:  bisacodyl PLACE 1 SUPPOSITORY (10 MG TOTAL) RECTALLY ONCE DAILY AS NEEDED FOR CONSTIPATION   DULoxetine 60 MG capsule Commonly known as:  CYMBALTA TAKE 1 CAPSULE (60 MG TOTAL) BY MOUTH ONCE DAILY.   gabapentin 300 MG capsule Commonly known as:  NEURONTIN   pantoprazole 40 MG tablet Commonly known as:  PROTONIX Take by mouth.   PROAIR HFA 108 (90 Base) MCG/ACT inhaler Generic drug:  albuterol Inhale into the lungs.   temazepam 30 MG capsule Commonly known as:  RESTORIL Take by mouth.   tolterodine 4 MG 24 hr capsule Commonly known as:  DETROL LA Take 1 capsule (4 mg total) by mouth daily.   triamterene-hydrochlorothiazide 37.5-25 MG tablet Commonly known as:  MAXZIDE-25 triamterene 37.5 mg-hydrochlorothiazide 25 mg tablet   vitamin C 1000 MG tablet Take by mouth.   Vitamin D3 2000 units  capsule Take by mouth.       Allergies:  Allergies  Allergen Reactions  . Aspirin Other (See Comments)    Epitaxis   . Atorvastatin Other (See Comments)  . Mirabegron Itching  . Statins Other (See Comments)  . Acetaminophen Palpitations    Family History: Family History  Problem Relation Age of Onset  . Diabetes Mother   . Diabetes Brother   . Hypertension Brother   . Breast cancer Neg Hx     Social History:  reports that she has never smoked. She has never used smokeless tobacco. She reports that she does not drink alcohol or use drugs.  ROS: UROLOGY Frequent Urination?: No Hard to postpone urination?: No Burning/pain with urination?: No Get up at night  to urinate?: Yes Leakage of urine?: Yes Urine stream starts and stops?: No Trouble starting stream?: No Do you have to strain to urinate?: No Blood in urine?: No Urinary tract infection?: No Sexually transmitted disease?: No Injury to kidneys or bladder?: No Painful intercourse?: No Weak stream?: No Currently pregnant?: No Vaginal bleeding?: No Last menstrual period?: n  Gastrointestinal Nausea?: No Vomiting?: No Indigestion/heartburn?: No Diarrhea?: No Constipation?: Yes  Constitutional Fever: No Night sweats?: No Weight loss?: No Fatigue?: Yes  Skin Skin rash/lesions?: No Itching?: No  Eyes Blurred vision?: No Double vision?: No  Ears/Nose/Throat Sore throat?: No Sinus problems?: No  Hematologic/Lymphatic Swollen glands?: No Easy bruising?: No  Cardiovascular Leg swelling?: Yes Chest pain?: No  Respiratory Cough?: No Shortness of breath?: No  Endocrine Excessive thirst?: No  Musculoskeletal Back pain?: No Joint pain?: Yes  Neurological Headaches?: No Dizziness?: No  Psychologic Depression?: No Anxiety?: No  Physical Exam: BP 115/69   Pulse 85   Ht 5\' 3"  (1.6 m)   Wt 182 lb (82.6 kg)   BMI 32.24 kg/m   Constitutional:  Alert and oriented, No acute distress.  Laboratory Data: Lab Results  Component Value Date   WBC 6.1 07/19/2016   HGB 14.7 07/19/2016   HCT 43.3 07/19/2016   MCV 88.2 07/19/2016   PLT 163 07/19/2016    Lab Results  Component Value Date   CREATININE 1.24 (H) 07/19/2016    No results found for: PSA  No results found for: TESTOSTERONE  No results found for: HGBA1C  Urinalysis    Component Value Date/Time   COLORURINE YELLOW (A) 07/19/2016 1620   APPEARANCEUR Cloudy (A) 12/06/2017 1411   LABSPEC 1.012 07/19/2016 1620   PHURINE 6.0 07/19/2016 1620   GLUCOSEU Negative 12/06/2017 1411   HGBUR NEGATIVE 07/19/2016 1620   BILIRUBINUR Negative 12/06/2017 1411   KETONESUR NEGATIVE 07/19/2016 1620    PROTEINUR Negative 12/06/2017 1411   PROTEINUR NEGATIVE 07/19/2016 1620   NITRITE Negative 12/06/2017 1411   NITRITE NEGATIVE 07/19/2016 1620   LEUKOCYTESUR Trace (A) 12/06/2017 1411    Pertinent Imaging: None  Assessment & Plan: Urodynamics ordered as a partial responder to Detrol  There are no diagnoses linked to this encounter.  No follow-ups on file.  Martina Sinner, MD  [facility] [address], [address] [place], [place] [address] [phone]

## 2018-01-26 DIAGNOSIS — M810 Age-related osteoporosis without current pathological fracture: Secondary | ICD-10-CM | POA: Diagnosis not present

## 2018-01-28 ENCOUNTER — Ambulatory Visit
Admission: RE | Admit: 2018-01-28 | Discharge: 2018-01-28 | Disposition: A | Discharge status: Home / Self Care | Payer: PPO | Source: Ambulatory Visit | Attending: Family Medicine | Admitting: Family Medicine

## 2018-01-28 DIAGNOSIS — R1111 Vomiting without nausea: Secondary | ICD-10-CM | POA: Diagnosis present

## 2018-01-28 DIAGNOSIS — K224 Dyskinesia of esophagus: Secondary | ICD-10-CM | POA: Diagnosis not present

## 2018-01-28 DIAGNOSIS — R192 Visible peristalsis: Secondary | ICD-10-CM | POA: Insufficient documentation

## 2018-01-28 DIAGNOSIS — K449 Diaphragmatic hernia without obstruction or gangrene: Secondary | ICD-10-CM | POA: Insufficient documentation

## 2018-01-28 DIAGNOSIS — K219 Gastro-esophageal reflux disease without esophagitis: Secondary | ICD-10-CM | POA: Insufficient documentation

## 2018-01-28 DIAGNOSIS — M6289 Other specified disorders of muscle: Secondary | ICD-10-CM | POA: Insufficient documentation

## 2018-01-28 DIAGNOSIS — R1310 Dysphagia, unspecified: Secondary | ICD-10-CM

## 2018-02-02 ENCOUNTER — Ambulatory Visit: Payer: Self-pay | Admitting: Podiatry

## 2018-02-02 ENCOUNTER — Encounter: Payer: Self-pay | Admitting: Podiatry

## 2018-02-02 DIAGNOSIS — L600 Ingrowing nail: Secondary | ICD-10-CM

## 2018-02-02 DIAGNOSIS — Z9889 Other specified postprocedural states: Secondary | ICD-10-CM

## 2018-02-02 MED ORDER — DOXYCYCLINE HYCLATE 100 MG PO TABS: 100.0000 mg | ORAL_TABLET | Freq: Two times a day (BID) | ORAL | 0 refills | Status: DC

## 2018-02-02 NOTE — Patient Instructions (Signed)

## 2018-02-02 NOTE — Progress Notes (Signed)
She presents today for follow-up of her matrixectomy's hallux bilaterally.  She states that her little red and the right one sore.  She continues to soak in Epsom salts water but she is been leaving it open she states.  Objective: Vital signs are stable alert and oriented x3.  Pulses are palpable.  Mild erythema and some serosanguineous drainage dorsal aspect of the right foot rather than that of the left around the nail bed.  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  Plan: Encouraged her to soak every other day apply a Band-Aid daily leave open at bedtime wrote a prescription for doxycycline twice daily.  Follow-up with me in 1 to 2 weeks

## 2018-02-03 DIAGNOSIS — G608 Other hereditary and idiopathic neuropathies: Secondary | ICD-10-CM | POA: Diagnosis not present

## 2018-02-03 DIAGNOSIS — I872 Venous insufficiency (chronic) (peripheral): Secondary | ICD-10-CM | POA: Diagnosis not present

## 2018-02-03 DIAGNOSIS — R1310 Dysphagia, unspecified: Secondary | ICD-10-CM | POA: Diagnosis not present

## 2018-02-03 DIAGNOSIS — R4189 Other symptoms and signs involving cognitive functions and awareness: Secondary | ICD-10-CM | POA: Diagnosis not present

## 2018-02-07 DIAGNOSIS — R1310 Dysphagia, unspecified: Secondary | ICD-10-CM | POA: Diagnosis not present

## 2018-02-07 DIAGNOSIS — K5909 Other constipation: Secondary | ICD-10-CM | POA: Diagnosis not present

## 2018-02-18 ENCOUNTER — Ambulatory Visit (INDEPENDENT_AMBULATORY_CARE_PROVIDER_SITE_OTHER): Payer: PPO | Admitting: Podiatry

## 2018-02-18 ENCOUNTER — Ambulatory Visit: Payer: PPO | Admitting: Podiatry

## 2018-02-18 DIAGNOSIS — L600 Ingrowing nail: Secondary | ICD-10-CM

## 2018-02-21 ENCOUNTER — Ambulatory Visit: Payer: PPO | Admitting: Urology

## 2018-02-21 DIAGNOSIS — R350 Frequency of micturition: Secondary | ICD-10-CM | POA: Diagnosis not present

## 2018-02-21 DIAGNOSIS — R3915 Urgency of urination: Secondary | ICD-10-CM | POA: Diagnosis not present

## 2018-02-21 DIAGNOSIS — N3946 Mixed incontinence: Secondary | ICD-10-CM | POA: Diagnosis not present

## 2018-02-21 NOTE — Progress Notes (Signed)
   Subjective: Patient presents today 2 weeks post total nail avulsion procedure of the bilateral great toes. Patient states that the toe and nail fold is feeling much better. Patient is here for further evaluation and treatment.   Past Medical History:  Diagnosis Date  . Arthritis   . Asthma     Objective: Skin is warm, dry and supple. Nail bed and respective nail fold appears to be healing appropriately. Open wound to the associated nail fold with a granular wound base and moderate amount of fibrotic tissue. Minimal drainage noted.   Assessment: #1 postop temporary total nail avulsion #2 open wound periungual nail fold and nail bed of respective digit.   Plan of care: #1 patient was evaluated  #2 debridement of open wound was performed to the periungual border and nail fold of the respective toe using a currette. Antibiotic ointment and Band-Aid was applied. #3 patient is to return to clinic on a PRN  basis.   Felecia ShellingBrent M. Evans, DPM [facility]  Dr. Felecia ShellingBrent M. Evans, DPM    [address]                                        [place], [place] [address]                Office [phone]  Fax [phone]

## 2018-02-23 ENCOUNTER — Other Ambulatory Visit: Payer: Self-pay | Admitting: Urology

## 2018-02-25 ENCOUNTER — Encounter: Payer: Self-pay | Admitting: Urology

## 2018-03-07 ENCOUNTER — Ambulatory Visit (INDEPENDENT_AMBULATORY_CARE_PROVIDER_SITE_OTHER): Payer: PPO | Admitting: Urology

## 2018-03-07 ENCOUNTER — Encounter: Payer: Self-pay | Admitting: Urology

## 2018-03-07 VITALS — BP 129/76 | HR 73 | Ht 63.0 in | Wt 190.5 lb

## 2018-03-07 DIAGNOSIS — N3946 Mixed incontinence: Secondary | ICD-10-CM | POA: Diagnosis not present

## 2018-03-07 MED ORDER — FESOTERODINE FUMARATE ER 8 MG PO TB24: 8.0000 mg | ORAL_TABLET | Freq: Every day | ORAL | 11 refills | Status: DC

## 2018-03-07 NOTE — Progress Notes (Signed)
03/07/2018 2:46 PM   Ann PlateElizabeth W Benson [birth year] [id number]  Referring provider: Lynnea FerrierKlein, Bert J III, MD [address] [facility] [address] [facility] [place], [place] [address]  No chief complaint on file.   HPI: I was consulted to assess the patient's urinary incontinence worsening over many years. She leaks with coughing sneezing bending lifting. She has urge incontinence. Both are significant. She has bedwetting. She soaks 3 or 4 pads a day.  She has chronic renal insufficiency.  On pelvic examination the patient had grade 1 hypermobility of the bladder neck and a negative cough test. She did not of prolapse. She cannot cough hard. 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 I do not think she had lichen sclerosis but there may be an element of such  The patient has mixed incontinence and bedwetting. She has frequency and nocturia. I decided to hold off on urodynamics at this stage but may need them in the future.   Incontinence has persisted in spite of taking Myrbetriq.  Cysto: normal  The patient has mixed incontinence and bedwetting.  My plan was to order urodynamics if she did not respond to the second pill Detrol.  The patient is a partial responder but still has urgency incontinence and bedwetting in frequency stable.  Today Frequency and incontinence are stable On urodynamics the patient emptied efficiently.  Maximum bladder capacity was 212 mL.  Patient had sensory urgency.  Bladder was unstable reaching a pressure of 29 cm of water and she had to void off the contraction.  She triggered the contraction.  She had no stress incontinence at a pressure of 88 cm of water.  She voided 57 mils of maximal flow 4 mils per second.  Voiding pressure 16 cm of water.  Residual was 95 mL.  This may not have been a true voluntary void.  EMG activity increased mildly.  Bladder neck descent at 1 cm.  The details of the urodynamics are signed and  dictated  The patient has an overactive bladder.  Approximately 90% of her problem is such.  If she does not reach her treatment goal with a third medication I would review refractory therapies with her.    PMH: Past Medical History:  Diagnosis Date  . Arthritis   . Asthma     Surgical History: Past Surgical History:  Procedure Laterality Date  . ABDOMINAL HYSTERECTOMY    . APPENDECTOMY    . BREAST BIOPSY Left 07/2013   neg  . CHOLECYSTECTOMY    . SHOULDER ARTHROSCOPY Left     Home Medications:  Allergies as of 03/07/2018      Reactions   Aspirin Other (See Comments)   Epitaxis    Atorvastatin Other (See Comments)   Mirabegron Itching   Statins Other (See Comments)   Acetaminophen Palpitations      Medication List        Accurate as of 03/07/18  2:46 PM. Always use your most recent med list.          B-12 5000 MCG Caps Take by mouth daily.   baclofen 10 MG tablet Commonly known as:  LIORESAL Take 5 mg by mouth.   Calcium Carbonate-Vitamin D 600-400 MG-UNIT tablet Take by mouth.   CVS BISACODYL 10 MG suppository Generic drug:  bisacodyl PLACE 1 SUPPOSITORY (10 MG TOTAL) RECTALLY ONCE DAILY AS NEEDED FOR CONSTIPATION   doxycycline 100 MG tablet Commonly known as:  VIBRA-TABS Take 1 tablet (100 mg total) by mouth  2 (two) times daily.   DULoxetine 60 MG capsule Commonly known as:  CYMBALTA TAKE 1 CAPSULE (60 MG TOTAL) BY MOUTH ONCE DAILY.   gabapentin 300 MG capsule Commonly known as:  NEURONTIN   pantoprazole 40 MG tablet Commonly known as:  PROTONIX Take by mouth.   PROAIR HFA 108 (90 Base) MCG/ACT inhaler Generic drug:  albuterol Inhale into the lungs.   temazepam 30 MG capsule Commonly known as:  RESTORIL Take by mouth.   tolterodine 4 MG 24 hr capsule Commonly known as:  DETROL LA Take 1 capsule (4 mg total) by mouth daily.   triamterene-hydrochlorothiazide 37.5-25 MG tablet Commonly known as:  MAXZIDE-25 triamterene 37.5  mg-hydrochlorothiazide 25 mg tablet   vitamin C 1000 MG tablet Take by mouth.   Vitamin D3 2000 units capsule Take by mouth.       Allergies:  Allergies  Allergen Reactions  . Aspirin Other (See Comments)    Epitaxis   . Atorvastatin Other (See Comments)  . Mirabegron Itching  . Statins Other (See Comments)  . Acetaminophen Palpitations    Family History: Family History  Problem Relation Age of Onset  . Diabetes Mother   . Diabetes Brother   . Hypertension Brother   . Breast cancer Neg Hx     Social History:  reports that she has never smoked. She has never used smokeless tobacco. She reports that she does not drink alcohol or use drugs.  ROS:                                        Physical Exam: There were no vitals taken for this visit.  Constitutional:  Alert and oriented, No acute distress.   Laboratory Data: Lab Results  Component Value Date   WBC 6.1 07/19/2016   HGB 14.7 07/19/2016   HCT 43.3 07/19/2016   MCV 88.2 07/19/2016   PLT 163 07/19/2016    Lab Results  Component Value Date   CREATININE 1.24 (H) 07/19/2016    No results found for: PSA  No results found for: TESTOSTERONE  No results found for: HGBA1C  Urinalysis    Component Value Date/Time   COLORURINE YELLOW (A) 07/19/2016 1620   APPEARANCEUR Cloudy (A) 12/06/2017 1411   LABSPEC 1.012 07/19/2016 1620   PHURINE 6.0 07/19/2016 1620   GLUCOSEU Negative 12/06/2017 1411   HGBUR NEGATIVE 07/19/2016 1620   BILIRUBINUR Negative 12/06/2017 1411   KETONESUR NEGATIVE 07/19/2016 1620   PROTEINUR Negative 12/06/2017 1411   PROTEINUR NEGATIVE 07/19/2016 1620   NITRITE Negative 12/06/2017 1411   NITRITE NEGATIVE 07/19/2016 1620   LEUKOCYTESUR Trace (A) 12/06/2017 1411    Pertinent Imaging:   Assessment & Plan: Reassess on Toviaz samples and prescription 8 mg.  Discussed refractory therapies next visit taking in account her age  There are no diagnoses linked  to this encounter.  No follow-ups on file.  Martina Sinner, MD  [facility] [address] [place], [place] [address] [phone]

## 2018-03-15 ENCOUNTER — Encounter: Payer: Self-pay | Admitting: *Deleted

## 2018-03-16 ENCOUNTER — Encounter
Admission: RE | Disposition: A | Discharge status: Home / Self Care | Payer: Self-pay | Source: Ambulatory Visit | Attending: Internal Medicine

## 2018-03-16 ENCOUNTER — Ambulatory Visit
Admission: RE | Admit: 2018-03-16 | Discharge: 2018-03-16 | Disposition: A | Discharge status: Home / Self Care | Payer: PPO | Source: Ambulatory Visit | Attending: Internal Medicine | Admitting: Internal Medicine

## 2018-03-16 ENCOUNTER — Other Ambulatory Visit: Payer: Self-pay | Admitting: Internal Medicine

## 2018-03-16 ENCOUNTER — Ambulatory Visit: Payer: PPO | Admitting: Certified Registered Nurse Anesthetist

## 2018-03-16 ENCOUNTER — Encounter: Payer: Self-pay | Admitting: Internal Medicine

## 2018-03-16 DIAGNOSIS — Z888 Allergy status to other drugs, medicaments and biological substances status: Secondary | ICD-10-CM | POA: Diagnosis not present

## 2018-03-16 DIAGNOSIS — I10 Essential (primary) hypertension: Secondary | ICD-10-CM | POA: Insufficient documentation

## 2018-03-16 DIAGNOSIS — K222 Esophageal obstruction: Secondary | ICD-10-CM | POA: Diagnosis not present

## 2018-03-16 DIAGNOSIS — Z886 Allergy status to analgesic agent status: Secondary | ICD-10-CM | POA: Diagnosis not present

## 2018-03-16 DIAGNOSIS — K589 Irritable bowel syndrome without diarrhea: Secondary | ICD-10-CM | POA: Diagnosis not present

## 2018-03-16 DIAGNOSIS — G709 Myoneural disorder, unspecified: Secondary | ICD-10-CM | POA: Diagnosis not present

## 2018-03-16 DIAGNOSIS — K224 Dyskinesia of esophagus: Secondary | ICD-10-CM | POA: Diagnosis not present

## 2018-03-16 DIAGNOSIS — E785 Hyperlipidemia, unspecified: Secondary | ICD-10-CM | POA: Insufficient documentation

## 2018-03-16 DIAGNOSIS — Z79899 Other long term (current) drug therapy: Secondary | ICD-10-CM | POA: Insufficient documentation

## 2018-03-16 DIAGNOSIS — Z90710 Acquired absence of both cervix and uterus: Secondary | ICD-10-CM | POA: Diagnosis not present

## 2018-03-16 DIAGNOSIS — I739 Peripheral vascular disease, unspecified: Secondary | ICD-10-CM | POA: Diagnosis not present

## 2018-03-16 DIAGNOSIS — R142 Eructation: Secondary | ICD-10-CM

## 2018-03-16 DIAGNOSIS — K219 Gastro-esophageal reflux disease without esophagitis: Secondary | ICD-10-CM | POA: Insufficient documentation

## 2018-03-16 DIAGNOSIS — Z7951 Long term (current) use of inhaled steroids: Secondary | ICD-10-CM | POA: Diagnosis not present

## 2018-03-16 DIAGNOSIS — M1990 Unspecified osteoarthritis, unspecified site: Secondary | ICD-10-CM | POA: Insufficient documentation

## 2018-03-16 DIAGNOSIS — Z9049 Acquired absence of other specified parts of digestive tract: Secondary | ICD-10-CM | POA: Insufficient documentation

## 2018-03-16 DIAGNOSIS — K449 Diaphragmatic hernia without obstruction or gangrene: Secondary | ICD-10-CM | POA: Insufficient documentation

## 2018-03-16 DIAGNOSIS — R1013 Epigastric pain: Secondary | ICD-10-CM | POA: Insufficient documentation

## 2018-03-16 DIAGNOSIS — T182XXA Foreign body in stomach, initial encounter: Secondary | ICD-10-CM | POA: Diagnosis not present

## 2018-03-16 DIAGNOSIS — K3189 Other diseases of stomach and duodenum: Secondary | ICD-10-CM

## 2018-03-16 DIAGNOSIS — F419 Anxiety disorder, unspecified: Secondary | ICD-10-CM | POA: Diagnosis not present

## 2018-03-16 DIAGNOSIS — R1310 Dysphagia, unspecified: Secondary | ICD-10-CM | POA: Diagnosis not present

## 2018-03-16 DIAGNOSIS — J449 Chronic obstructive pulmonary disease, unspecified: Secondary | ICD-10-CM | POA: Insufficient documentation

## 2018-03-16 DIAGNOSIS — G629 Polyneuropathy, unspecified: Secondary | ICD-10-CM | POA: Insufficient documentation

## 2018-03-16 DIAGNOSIS — T18128A Food in esophagus causing other injury, initial encounter: Secondary | ICD-10-CM | POA: Diagnosis not present

## 2018-03-16 DIAGNOSIS — F329 Major depressive disorder, single episode, unspecified: Secondary | ICD-10-CM | POA: Diagnosis not present

## 2018-03-16 DIAGNOSIS — R51 Headache: Secondary | ICD-10-CM | POA: Insufficient documentation

## 2018-03-16 DIAGNOSIS — I129 Hypertensive chronic kidney disease with stage 1 through stage 4 chronic kidney disease, or unspecified chronic kidney disease: Secondary | ICD-10-CM | POA: Diagnosis not present

## 2018-03-16 DIAGNOSIS — N183 Chronic kidney disease, stage 3 (moderate): Secondary | ICD-10-CM | POA: Diagnosis not present

## 2018-03-16 DIAGNOSIS — K228 Other specified diseases of esophagus: Secondary | ICD-10-CM | POA: Diagnosis not present

## 2018-03-16 HISTORY — DX: Major depressive disorder, single episode, unspecified: F32.9

## 2018-03-16 HISTORY — DX: Gastro-esophageal reflux disease without esophagitis: K21.9

## 2018-03-16 HISTORY — DX: Personal history of other diseases of the digestive system: Z87.19

## 2018-03-16 HISTORY — DX: Headache: R51

## 2018-03-16 HISTORY — DX: Essential (primary) hypertension: I10

## 2018-03-16 HISTORY — DX: Headache, unspecified: R51.9

## 2018-03-16 HISTORY — DX: Myoneural disorder, unspecified: G70.9

## 2018-03-16 HISTORY — DX: Irritable bowel syndrome, unspecified: K58.9

## 2018-03-16 HISTORY — DX: Depression, unspecified: F32.A

## 2018-03-16 HISTORY — PX: ESOPHAGOGASTRODUODENOSCOPY (EGD) WITH PROPOFOL: SHX5813

## 2018-03-16 HISTORY — DX: Hyperlipidemia, unspecified: E78.5

## 2018-03-16 SURGERY — ESOPHAGOGASTRODUODENOSCOPY (EGD) WITH PROPOFOL
Anesthesia: General

## 2018-03-16 MED ORDER — PROPOFOL 10 MG/ML IV BOLUS
INTRAVENOUS | Status: DC | PRN
  Administered 2018-03-16: 60 mg via INTRAVENOUS

## 2018-03-16 MED ORDER — GLYCOPYRROLATE 0.2 MG/ML IJ SOLN
INTRAMUSCULAR | Status: AC
  Filled 2018-03-16: qty 1

## 2018-03-16 MED ORDER — LIDOCAINE HCL (CARDIAC) PF 100 MG/5ML IV SOSY
PREFILLED_SYRINGE | INTRAVENOUS | Status: DC | PRN
  Administered 2018-03-16: 100 mg via INTRAVENOUS

## 2018-03-16 MED ORDER — PROPOFOL 500 MG/50ML IV EMUL
INTRAVENOUS | Status: DC | PRN
  Administered 2018-03-16: 160 ug/kg/min via INTRAVENOUS

## 2018-03-16 MED ORDER — SODIUM CHLORIDE 0.9 % IV SOLN
INTRAVENOUS | Status: DC
  Administered 2018-03-16: 1000 mL via INTRAVENOUS

## 2018-03-16 NOTE — Anesthesia Procedure Notes (Signed)
Performed by: Beane, Catherine, CRNA Pre-anesthesia Checklist: Patient identified, Emergency Drugs available, Suction available, Patient being monitored and Timeout performed Patient Re-evaluated:Patient Re-evaluated prior to induction Oxygen Delivery Method: Nasal cannula Induction Type: IV induction       

## 2018-03-16 NOTE — Transfer of Care (Signed)
Immediate Anesthesia Transfer of Care Note  Patient: Ann Benson  Procedure(s) Performed: ESOPHAGOGASTRODUODENOSCOPY (EGD) WITH PROPOFOL (N/A )  Patient Location: PACU  Anesthesia Type:General  Level of Consciousness: sedated  Airway & Oxygen Therapy: Patient Spontanous Breathing and Patient connected to nasal cannula oxygen  Post-op Assessment: Report given to RN and Post -op Vital signs reviewed and stable  Post vital signs: Reviewed and stable  Last Vitals:  Vitals Value Taken Time  BP    Temp    Pulse 64 03/16/2018 10:23 AM  Resp 16 03/16/2018 10:23 AM  SpO2 97 % 03/16/2018 10:23 AM  Vitals shown include unvalidated device data.  Last Pain:  Vitals:   03/16/18 1023  TempSrc: (P) Tympanic  PainSc:          Complications: No apparent anesthesia complications

## 2018-03-16 NOTE — Anesthesia Post-op Follow-up Note (Signed)
Anesthesia QCDR form completed.        

## 2018-03-16 NOTE — Anesthesia Preprocedure Evaluation (Signed)
Anesthesia Evaluation  Patient identified by MRN, date of birth, ID band Patient awake    Reviewed: Allergy & Precautions, H&P , NPO status , reviewed documented beta blocker date and time   Airway Mallampati: III  TM Distance: >3 FB Neck ROM: limited    Dental  (+) Chipped   Pulmonary asthma , COPD,    Pulmonary exam normal        Cardiovascular hypertension, + Peripheral Vascular Disease  Normal cardiovascular exam     Neuro/Psych  Headaches, PSYCHIATRIC DISORDERS Anxiety Depression  Neuromuscular disease    GI/Hepatic hiatal hernia, GERD  ,  Endo/Other    Renal/GU Renal disease     Musculoskeletal  (+) Arthritis ,   Abdominal   Peds  Hematology   Anesthesia Other Findings Past Medical History: No date: Arthritis No date: Asthma No date: Depression No date: GERD (gastroesophageal reflux disease) No date: Headache No date: History of hiatal hernia No date: Hyperlipidemia No date: Hyperlipidemia No date: Hypertension No date: IBS (irritable bowel syndrome) No date: Neuromuscular disorder (HCC)     Comment:  neuropathy  Past Surgical History: No date: ABDOMINAL HYSTERECTOMY No date: APPENDECTOMY 07/2013: BREAST BIOPSY; Left     Comment:  neg No date: CHOLECYSTECTOMY No date: EYE SURGERY     Comment:  cataracts No date: KNEE ARTHROSCOPY No date: SHOULDER ARTHROSCOPY; Left  BMI    Body Mass Index:  31.76 kg/m      Reproductive/Obstetrics                             Anesthesia Physical Anesthesia Plan  ASA: III  Anesthesia Plan: General   Post-op Pain Management:    Induction: Intravenous  PONV Risk Score and Plan: 3 and Treatment may vary due to age or medical condition and TIVA  Airway Management Planned: Nasal Cannula and Natural Airway  Additional Equipment:   Intra-op Plan:   Post-operative Plan:   Informed Consent: I have reviewed the patients History  and Physical, chart, labs and discussed the procedure including the risks, benefits and alternatives for the proposed anesthesia with the patient or authorized representative who has indicated his/her understanding and acceptance.   Dental Advisory Given  Plan Discussed with: CRNA  Anesthesia Plan Comments:         Anesthesia Quick Evaluation

## 2018-03-16 NOTE — H&P (Signed)
Outpatient short stay form Pre-procedure 03/16/2018 9:57 AM Ann Benson, M.D.  Primary Physician: Ann Benson, M.D.  Reason for visit:  Dysphagia  History of present illness:  Patient with intermittent dysphagia to solids. Barium swallow with tablet showed moderate hiatal hernia, delayed passage of a barium tablet near the gastroesophageal junction just proximal to the hernia but no stricture.  There was esophageal dysmotility noted.  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    Current Facility-Administered Medications:  .  0.9 %  sodium chloride infusion, , Intravenous, Continuous, Ann Benson, Ann Benson K, MD, Last Rate: 20 mL/hr at 03/16/18 0925, 1,000 mL at 03/16/18 [id number]  Medications Prior to Admission  Medication Sig Dispense Refill Last Dose  . albuterol (PROAIR HFA) 108 (90 Base) MCG/ACT inhaler Inhale into the lungs.   03/15/2018 at Unknown time  . Ascorbic Acid (VITAMIN C) 1000 MG tablet Take by mouth.   03/15/2018 at Unknown time  . baclofen (LIORESAL) 10 MG tablet Take 5 mg by mouth.   03/15/2018 at Unknown time  . Calcium Carbonate-Vitamin D 600-400 MG-UNIT tablet Take by mouth.   03/15/2018 at Unknown time  . Cholecalciferol (VITAMIN D3) 2000 units capsule Take by mouth.   03/15/2018 at Unknown time  . CVS BISACODYL 10 MG suppository PLACE 1 SUPPOSITORY (10 MG TOTAL) RECTALLY ONCE DAILY AS NEEDED FOR CONSTIPATION  0 03/15/2018 at Unknown time  . Cyanocobalamin (B-12) 5000 MCG CAPS Take by mouth daily.   03/15/2018 at Unknown time  . doxycycline (VIBRA-TABS) 100 MG tablet Take 1 tablet (100 mg total) by mouth 2 (two) times daily. 20 tablet 0 03/15/2018 at Unknown time  . DULoxetine (CYMBALTA) 60 MG capsule TAKE 1 CAPSULE (60 MG TOTAL) BY MOUTH ONCE DAILY.  4 03/15/2018 at Unknown time  . fesoterodine (TOVIAZ) 8 MG TB24 tablet Take 1 tablet (8 mg total) by mouth daily. 30 tablet 11 03/15/2018 at Unknown time  . gabapentin (NEURONTIN) 300 MG capsule     03/15/2018 at Unknown time  . LINZESS 145 MCG CAPS capsule    03/15/2018 at Unknown time  . pantoprazole (PROTONIX) 40 MG tablet Take by mouth.   03/15/2018 at Unknown time  . temazepam (RESTORIL) 30 MG capsule Take by mouth.   03/15/2018 at Unknown time  . tolterodine (DETROL LA) 4 MG 24 hr capsule Take 1 capsule (4 mg total) by mouth daily. 30 capsule 11 03/15/2018 at Unknown time  . triamterene-hydrochlorothiazide (MAXZIDE-25) 37.5-25 MG tablet triamterene 37.5 mg-hydrochlorothiazide 25 mg tablet   03/15/2018 at Unknown time     Allergies  Allergen Reactions  . Aspirin Other (See Comments)    Epitaxis   . Atorvastatin Other (See Comments)  . Mirabegron Itching  . Statins Other (See Comments)  . Acetaminophen Palpitations     Past Medical History:  Diagnosis Date  . Arthritis   . Asthma   . Depression   . GERD (gastroesophageal reflux disease)   . Headache   . History of hiatal hernia   . Hyperlipidemia   . Hyperlipidemia   . Hypertension   . IBS (irritable bowel syndrome)   . Neuromuscular disorder (HCC)    neuropathy    Review of systems:  Otherwise negative.    Physical Exam  Gen: Alert, oriented. Appears stated age.  HEENT: [place]/AT. PERRLA. Lungs: CTA, no wheezes. CV: RR nl S1, S2. Abd: soft, benign, no masses. BS+ Ext: No edema. Pulses 2+    Planned procedures: Proceed with colonoscopy. The patient understands  the nature of the planned procedure, indications, risks, alternatives and potential complications including but not limited to bleeding, infection, perforation, damage to internal organs and possible oversedation/side effects from anesthesia. The patient agrees and gives consent to proceed.  Please refer to procedure notes for findings, recommendations and patient disposition/instructions.     Ann Benson, M.D. Gastroenterology 03/16/2018  9:57 AM

## 2018-03-16 NOTE — Op Note (Signed)
[facility] Gastroenterology Patient Name: Ann Benson Procedure Date: 03/16/2018 9:58 AM MRN: [id number] Account #: [id number] Date of Birth: [birth year] Admit Type: Outpatient Age: 77 Room: [facility] ENDO ROOM 4 Gender: Female Note Status: Finalized Procedure:            Upper GI endoscopy Indications:          Epigastric abdominal pain, Dysphagia Providers:            Boykin Nearing. Norma Fredrickson MD, MD Referring MD:         Daniel Nones, MD (Referring MD) Medicines:            Propofol per Anesthesia Complications:        No immediate complications. Procedure:            Pre-Anesthesia Assessment:                       - The risks and benefits of the procedure and the                        sedation options and risks were discussed with the                        patient. All questions were answered and informed                        consent was obtained.                       - Patient identification and proposed procedure were                        verified prior to the procedure by the nurse. The                        procedure was verified in the procedure room.                       - ASA Grade Assessment: III - A patient with severe                        systemic disease.                       - After reviewing the risks and benefits, the patient                        was deemed in satisfactory condition to undergo the                        procedure.                       After obtaining informed consent, the endoscope was                        passed under direct vision. Throughout the procedure,                        the patient's blood pressure, pulse, and oxygen  saturations were monitored continuously. The Endoscope                        was introduced through the mouth, and advanced to the                        third part of duodenum. The upper GI endoscopy was                        accomplished without difficulty. The patient  tolerated                        the procedure well. Findings:      One benign-appearing, intrinsic mild stenosis was found at the       gastroesophageal junction. This stenosis measured less than one cm (in       length). The scope was withdrawn. Dilation was performed with a Maloney       dilator with mild resistance at 54 Fr.      A 2 cm hiatal hernia was present.      A medium amount of food (residue) was found in the gastric fundus.      The examined duodenum was normal.      Food was found in the middle third of the esophagus. Impression:           - Benign-appearing esophageal stenosis. Dilated.                       - 2 cm hiatal hernia.                       - A medium amount of food (residue) in the stomach.                       - Normal examined duodenum.                       - Food in the middle third of the esophagus.                       - No specimens collected. Recommendation:       - Patient has a contact number available for                        emergencies. The signs and symptoms of potential                        delayed complications were discussed with the patient.                        Return to normal activities tomorrow. Written discharge                        instructions were provided to the patient.                       - Patient has a contact number available for                        emergencies. The signs and symptoms of potential  delayed complications were discussed with the patient.                        Return to normal activities tomorrow. Written discharge                        instructions were provided to the patient.                       - Resume previous diet.                       - Continue present medications.                       - Repeat upper endoscopy PRN for retreatment.                       - Advise Esophageal manometry which will be ordered                       - Return to physician assistant in 3  months.                       - The findings and recommendations were discussed with                        the patient and their family. Procedure Code(s):    --- Professional ---                       [phone], Esophagogastroduodenoscopy, flexible, transoral;                        diagnostic, including collection of specimen(s) by                        brushing or washing, when performed (separate procedure)                       43450, Dilation of esophagus, by unguided sound or                        bougie, single or multiple passes Diagnosis Code(s):    --- Professional ---                       R13.10, Dysphagia, unspecified                       R10.13, Epigastric pain                       T18.128A, Food in esophagus causing other injury,                        initial encounter                       K44.9, Diaphragmatic hernia without obstruction or                        gangrene  K22.2, Esophageal obstruction CPT copyright 2017 American [employer] Association. All rights reserved. The codes documented in this report are preliminary and upon coder review may  be revised to meet current compliance requirements. Stanton Kidney MD, MD 03/16/2018 10:24:34 AM This report has been signed electronically. Number of Addenda: 0 Note Initiated On: 03/16/2018 9:58 AM      [facility]

## 2018-03-16 NOTE — Anesthesia Postprocedure Evaluation (Signed)
Anesthesia Post Note  Patient: Ann Benson  Procedure(s) Performed: ESOPHAGOGASTRODUODENOSCOPY (EGD) WITH PROPOFOL (N/A )  Patient location during evaluation: Endoscopy Anesthesia Type: General Level of consciousness: awake and alert Pain management: pain level controlled Vital Signs Assessment: post-procedure vital signs reviewed and stable Respiratory status: spontaneous breathing, nonlabored ventilation and respiratory function stable Cardiovascular status: blood pressure returned to baseline and stable Postop Assessment: no apparent nausea or vomiting Anesthetic complications: no     Last Vitals:  Vitals:   03/16/18 0916 03/16/18 1023  BP: (!) 142/70 (!) 104/52  Pulse: 66   Resp: 16 18  Temp: (!) 36 C (!) 36.1 C  SpO2: 100% 98%    Last Pain:  Vitals:   03/16/18 1030  TempSrc:   PainSc: 0-No pain                 Christia ReadingScott T Howell

## 2018-03-29 ENCOUNTER — Ambulatory Visit
Admission: RE | Admit: 2018-03-29 | Discharge: 2018-03-29 | Disposition: A | Discharge status: Home / Self Care | Payer: PPO | Source: Ambulatory Visit | Attending: Internal Medicine | Admitting: Internal Medicine

## 2018-03-29 DIAGNOSIS — K3189 Other diseases of stomach and duodenum: Secondary | ICD-10-CM

## 2018-03-29 DIAGNOSIS — R142 Eructation: Secondary | ICD-10-CM

## 2018-03-31 ENCOUNTER — Ambulatory Visit
Admission: RE | Admit: 2018-03-31 | Discharge: 2018-03-31 | Disposition: A | Discharge status: Home / Self Care | Payer: PPO | Source: Ambulatory Visit | Attending: Internal Medicine | Admitting: Internal Medicine

## 2018-03-31 DIAGNOSIS — K3189 Other diseases of stomach and duodenum: Secondary | ICD-10-CM | POA: Diagnosis not present

## 2018-03-31 DIAGNOSIS — R142 Eructation: Secondary | ICD-10-CM | POA: Insufficient documentation

## 2018-03-31 DIAGNOSIS — T182XXA Foreign body in stomach, initial encounter: Secondary | ICD-10-CM | POA: Diagnosis not present

## 2018-03-31 MED ORDER — TECHNETIUM TC 99M SULFUR COLLOID
2.0500 | Freq: Once | INTRAVENOUS | Status: AC | PRN
  Administered 2018-03-31: 2.05 via ORAL

## 2018-04-15 DIAGNOSIS — R1310 Dysphagia, unspecified: Secondary | ICD-10-CM | POA: Diagnosis not present

## 2018-04-18 ENCOUNTER — Ambulatory Visit: Payer: PPO | Admitting: Urology

## 2018-04-18 ENCOUNTER — Encounter: Payer: Self-pay | Admitting: Urology

## 2018-04-18 DIAGNOSIS — E7849 Other hyperlipidemia: Secondary | ICD-10-CM | POA: Diagnosis not present

## 2018-04-18 DIAGNOSIS — N183 Chronic kidney disease, stage 3 (moderate): Secondary | ICD-10-CM | POA: Diagnosis not present

## 2018-04-18 DIAGNOSIS — I700 Atherosclerosis of aorta: Secondary | ICD-10-CM | POA: Diagnosis not present

## 2018-04-20 DIAGNOSIS — N183 Chronic kidney disease, stage 3 (moderate): Secondary | ICD-10-CM | POA: Diagnosis not present

## 2018-04-20 DIAGNOSIS — Z0000 Encounter for general adult medical examination without abnormal findings: Secondary | ICD-10-CM | POA: Diagnosis not present

## 2018-04-20 DIAGNOSIS — M5136 Other intervertebral disc degeneration, lumbar region: Secondary | ICD-10-CM | POA: Diagnosis not present

## 2018-04-20 DIAGNOSIS — E7849 Other hyperlipidemia: Secondary | ICD-10-CM | POA: Diagnosis not present

## 2018-04-20 DIAGNOSIS — G63 Polyneuropathy in diseases classified elsewhere: Secondary | ICD-10-CM | POA: Diagnosis not present

## 2018-04-20 DIAGNOSIS — K219 Gastro-esophageal reflux disease without esophagitis: Secondary | ICD-10-CM | POA: Diagnosis not present

## 2018-04-20 DIAGNOSIS — E538 Deficiency of other specified B group vitamins: Secondary | ICD-10-CM | POA: Diagnosis not present

## 2018-04-20 DIAGNOSIS — M810 Age-related osteoporosis without current pathological fracture: Secondary | ICD-10-CM | POA: Diagnosis not present

## 2018-04-20 DIAGNOSIS — I739 Peripheral vascular disease, unspecified: Secondary | ICD-10-CM | POA: Diagnosis not present

## 2018-04-20 DIAGNOSIS — I10 Essential (primary) hypertension: Secondary | ICD-10-CM | POA: Diagnosis not present

## 2018-04-20 DIAGNOSIS — F3342 Major depressive disorder, recurrent, in full remission: Secondary | ICD-10-CM | POA: Diagnosis not present

## 2018-04-20 DIAGNOSIS — I700 Atherosclerosis of aorta: Secondary | ICD-10-CM | POA: Diagnosis not present

## 2018-05-09 ENCOUNTER — Ambulatory Visit: Payer: PPO | Admitting: Urology

## 2018-05-09 VITALS — BP 124/68 | HR 69 | Ht 63.0 in | Wt 184.1 lb

## 2018-05-09 DIAGNOSIS — N3946 Mixed incontinence: Secondary | ICD-10-CM

## 2018-05-09 MED ORDER — FESOTERODINE FUMARATE ER 8 MG PO TB24: 8.0000 mg | ORAL_TABLET | Freq: Every day | ORAL | 11 refills | Status: DC

## 2018-05-09 NOTE — Progress Notes (Signed)
05/09/2018 3:30 PM   Pilar Plate 09/05/1940 [id number]  Referring provider: Lynnea Ferrier, MD [address] [facility] [place], [place] [address]  Chief Complaint  Patient presents with  . Urinary Incontinence    HPI: I was consulted to assess the patient's urinary incontinence worsening over many years. She leaks with coughing sneezing bending lifting. She has urge incontinence. Both are significant. She has bedwetting. She soaks 3 or 4 pads a day.  She has chronic renal insufficiency.  On pelvic examination the patient had grade 1 hypermobility of the bladder neck and a negative cough test. She did not of prolapse. She cannot cough hard. 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 I do not think she had lichen sclerosis but there may be an element of such  Incontinence has persisted in spite of taking Myrbetriq.  Cysto: normal  My plan was to order urodynamics if she did not respond to the second pill Detrol. The patient is a partial responder but still has urgency incontinence and bedwetting in frequency stable.  The patient has mixed incontinence and bedwetting. My plan was to order urodynamics if she did not respond to the second pill Detrol. The patient is a partial responder but still has urgency incontinence and bedwetting in frequency stable.  On urodynamics the patient emptied efficiently.  Maximum bladder capacity was 212 mL.  Patient had sensory urgency.  Bladder was unstable reaching a pressure of 29 cm of water and she had to void off the contraction.  She triggered the contraction.  She had no stress incontinence at a pressure of 88 cm of water.  She voided 57 mils of maximal flow 4 mils per second.  Voiding pressure 16 cm of water.  Residual was 95 mL.  This may not have been a true voluntary void.  EMG activity increased mildly.  Bladder neck descent at 1 cm.   The patient has an overactive bladder.   Approximately 90% of her problem is such.  If she does not reach her treatment goal with a third medication I would review refractory therapies with her.  Reassess on Toviaz  Today Incontinence significantly improved on Toviaz 8 mg.  Clinically not infected.  Still wears 2 pads but only mild leakage.  She would like to stay on it.  PMH: Past Medical History:  Diagnosis Date  . Arthritis   . Asthma   . Depression   . GERD (gastroesophageal reflux disease)   . Headache   . History of hiatal hernia   . Hyperlipidemia   . Hyperlipidemia   . Hypertension   . IBS (irritable bowel syndrome)   . Neuromuscular disorder [facility])    neuropathy    Surgical History: Past Surgical History:  Procedure Laterality Date  . ABDOMINAL HYSTERECTOMY    . APPENDECTOMY    . BREAST BIOPSY Left 07/2013   neg  . CHOLECYSTECTOMY    . ESOPHAGOGASTRODUODENOSCOPY (EGD) WITH PROPOFOL N/A 03/16/2018   Procedure: ESOPHAGOGASTRODUODENOSCOPY (EGD) WITH PROPOFOL;  Surgeon: Toledo, Boykin Nearing, MD;  Location: [facility] ENDOSCOPY;  Service: Gastroenterology;  Laterality: N/A;  . EYE SURGERY     cataracts  . KNEE ARTHROSCOPY    . SHOULDER ARTHROSCOPY Left     Home Medications:  Allergies as of 05/09/2018      Reactions   Aspirin Other (See Comments)   Epitaxis    Atorvastatin Other (See Comments)   Mirabegron Itching   Statins Other (See Comments)  Acetaminophen Palpitations      Medication List        Accurate as of 05/09/18  3:30 PM. Always use your most recent med list.          B-12 5000 MCG Caps Take by mouth daily.   baclofen 10 MG tablet Commonly known as:  LIORESAL Take 5 mg by mouth.   Calcium Carbonate-Vitamin D 600-400 MG-UNIT tablet Take by mouth.   [facility] BISACODYL 10 MG suppository Generic drug:  bisacodyl PLACE 1 SUPPOSITORY (10 MG TOTAL) RECTALLY ONCE DAILY AS NEEDED FOR CONSTIPATION   doxycycline 100 MG tablet Commonly known as:  VIBRA-TABS Take 1 tablet (100 mg total) by mouth 2  (two) times daily.   DULoxetine 60 MG capsule Commonly known as:  CYMBALTA TAKE 1 CAPSULE (60 MG TOTAL) BY MOUTH ONCE DAILY.   fesoterodine 8 MG Tb24 tablet Commonly known as:  TOVIAZ Take 1 tablet (8 mg total) by mouth daily.   furosemide 20 MG tablet Commonly known as:  LASIX furosemide 20 mg tablet   gabapentin 300 MG capsule Commonly known as:  NEURONTIN   KLOR-CON M10 10 MEQ tablet Generic drug:  potassium chloride Klor-Con M10 mEq tablet,extended release   LINZESS 145 MCG Caps capsule Generic drug:  linaclotide   ofloxacin 0.3 % ophthalmic solution Commonly known as:  OCUFLOX ofloxacin 0.3 % eye drops   pantoprazole 40 MG tablet Commonly known as:  PROTONIX Take by mouth.   PROAIR HFA 108 (90 Base) MCG/ACT inhaler Generic drug:  albuterol Inhale into the lungs.   temazepam 30 MG capsule Commonly known as:  RESTORIL Take by mouth.   tolterodine 4 MG 24 hr capsule Commonly known as:  DETROL LA Take 1 capsule (4 mg total) by mouth daily.   traMADol 50 MG tablet Commonly known as:  ULTRAM tramadol 50 mg tablet   triamterene-hydrochlorothiazide 37.5-25 MG tablet Commonly known as:  MAXZIDE-25 triamterene 37.5 mg-hydrochlorothiazide 25 mg tablet   vitamin C 1000 MG tablet Take by mouth.   Vitamin D3 2000 units capsule Take by mouth.       Allergies:  Allergies  Allergen Reactions  . Aspirin Other (See Comments)    Epitaxis   . Atorvastatin Other (See Comments)  . Mirabegron Itching  . Statins Other (See Comments)  . Acetaminophen Palpitations    Family History: Family History  Problem Relation Age of Onset  . Diabetes Mother   . Diabetes Brother   . Hypertension Brother   . Breast cancer Neg Hx     Social History:  reports that she has never smoked. She has never used smokeless tobacco. She reports that she does not drink alcohol or use drugs.  ROS: UROLOGY Frequent Urination?: No Hard to postpone urination?: No Burning/pain with  urination?: No Get up at night to urinate?: No Leakage of urine?: No Urine stream starts and stops?: No Trouble starting stream?: No Do you have to strain to urinate?: No Blood in urine?: No Urinary tract infection?: No Sexually transmitted disease?: No Injury to kidneys or bladder?: No Painful intercourse?: No Weak stream?: No Currently pregnant?: No Vaginal bleeding?: No Last menstrual period?: Postmenopausal  Gastrointestinal Nausea?: No Vomiting?: No Indigestion/heartburn?: Yes Diarrhea?: Yes Constipation?: No  Constitutional Fever: No Night sweats?: No Weight loss?: No Fatigue?: Yes  Skin Skin rash/lesions?: Yes Itching?: Yes  Eyes Blurred vision?: No Double vision?: No  Ears/Nose/Throat Sore throat?: No Sinus problems?: Yes  Hematologic/Lymphatic Swollen glands?: No Easy bruising?: No  Cardiovascular Leg swelling?:  Yes Chest pain?: No  Respiratory Cough?: Yes Shortness of breath?: Yes  Endocrine Excessive thirst?: Yes  Musculoskeletal Back pain?: Yes Joint pain?: Yes  Neurological Headaches?: No Dizziness?: No  Psychologic Depression?: No Anxiety?: No  Physical Exam: BP 124/68 (BP Location: Left Arm, Patient Position: Sitting, Cuff Size: Large)   Pulse 69   Ht 5\' 3"  (1.6 m)   Wt 184 lb 1.6 oz (83.5 kg)   BMI 32.61 kg/m   Constitutional:  Alert and oriented, No acute distress.   Laboratory Data: Lab Results  Component Value Date   WBC 6.1 07/19/2016   HGB 14.7 07/19/2016   HCT 43.3 07/19/2016   MCV 88.2 07/19/2016   PLT 163 07/19/2016    Lab Results  Component Value Date   CREATININE 1.24 (H) 07/19/2016    No results found for: PSA  No results found for: TESTOSTERONE  No results found for: HGBA1C  Urinalysis    Component Value Date/Time   COLORURINE YELLOW (A) 07/19/2016 1620   APPEARANCEUR Cloudy (A) 12/06/2017 1411   LABSPEC 1.012 07/19/2016 1620   PHURINE 6.0 07/19/2016 1620   GLUCOSEU Negative  12/06/2017 1411   HGBUR NEGATIVE 07/19/2016 1620   BILIRUBINUR Negative 12/06/2017 1411   KETONESUR NEGATIVE 07/19/2016 1620   PROTEINUR Negative 12/06/2017 1411   PROTEINUR NEGATIVE 07/19/2016 1620   NITRITE Negative 12/06/2017 1411   NITRITE NEGATIVE 07/19/2016 1620   LEUKOCYTESUR Trace (A) 12/06/2017 1411    Pertinent Imaging:   Assessment & Plan: Reassess in 4 months on Toviaz 8 mg.  There are no diagnoses linked to this encounter.  No follow-ups on file.  Martina Sinner, MD  [facility] [address], [address] [place], [place] [address] [phone]

## 2018-05-16 ENCOUNTER — Other Ambulatory Visit: Payer: Self-pay | Admitting: Internal Medicine

## 2018-05-16 DIAGNOSIS — Z1231 Encounter for screening mammogram for malignant neoplasm of breast: Secondary | ICD-10-CM

## 2018-06-06 DIAGNOSIS — G608 Other hereditary and idiopathic neuropathies: Secondary | ICD-10-CM | POA: Diagnosis not present

## 2018-06-06 DIAGNOSIS — R1310 Dysphagia, unspecified: Secondary | ICD-10-CM | POA: Diagnosis not present

## 2018-06-06 DIAGNOSIS — I872 Venous insufficiency (chronic) (peripheral): Secondary | ICD-10-CM | POA: Diagnosis not present

## 2018-06-06 DIAGNOSIS — E538 Deficiency of other specified B group vitamins: Secondary | ICD-10-CM | POA: Diagnosis not present

## 2018-06-06 DIAGNOSIS — R4189 Other symptoms and signs involving cognitive functions and awareness: Secondary | ICD-10-CM | POA: Diagnosis not present

## 2018-06-16 ENCOUNTER — Ambulatory Visit
Admission: RE | Admit: 2018-06-16 | Discharge: 2018-06-16 | Disposition: A | Discharge status: Home / Self Care | Payer: PPO | Source: Ambulatory Visit | Attending: Internal Medicine | Admitting: Internal Medicine

## 2018-06-16 DIAGNOSIS — Z1231 Encounter for screening mammogram for malignant neoplasm of breast: Secondary | ICD-10-CM | POA: Diagnosis not present

## 2018-09-12 ENCOUNTER — Ambulatory Visit: Payer: Commercial Managed Care - HMO | Admitting: Urology

## 2018-09-12 VITALS — BP 131/82 | HR 71 | Ht 64.0 in | Wt 175.0 lb

## 2018-09-12 DIAGNOSIS — R300 Dysuria: Secondary | ICD-10-CM | POA: Diagnosis not present

## 2018-09-12 DIAGNOSIS — R3129 Other microscopic hematuria: Secondary | ICD-10-CM

## 2018-09-12 LAB — URINALYSIS, COMPLETE
Bilirubin, UA: NEGATIVE
Glucose, UA: NEGATIVE
Ketones, UA: NEGATIVE
Leukocytes, UA: NEGATIVE
Nitrite, UA: NEGATIVE
Protein, UA: NEGATIVE
RBC, UA: NEGATIVE
Specific Gravity, UA: 1.025 (ref 1.005–1.030)
Urobilinogen, Ur: 0.2 mg/dL (ref 0.2–1.0)
pH, UA: 7 (ref 5.0–7.5)

## 2018-09-12 LAB — MICROSCOPIC EXAMINATION
RBC, UA: NONE SEEN /hpf (ref 0–2)
WBC, UA: NONE SEEN /hpf (ref 0–5)

## 2018-09-12 MED ORDER — FESOTERODINE FUMARATE ER 8 MG PO TB24: 8.0000 mg | ORAL_TABLET | Freq: Every day | ORAL | 11 refills | Status: DC

## 2018-09-12 NOTE — Addendum Note (Signed)
Addended by: Alfredo MartinezMACDIARMID, SCOTT on: 09/12/2018 03:20 PM   Modules accepted: Level of Service

## 2018-09-12 NOTE — Progress Notes (Signed)
09/12/2018 3:06 PM   Pilar Plate 07/12/1941 [id number]  Referring provider: Lynnea Ferrier, MD [address] [facility] [place], [place] [address]  Chief Complaint  Patient presents with  . mixed incontinence    HPI: I was consulted to assess the patient's urinary incontinence worsening over many years. She leaks with coughing sneezing bending lifting. She has urge incontinence. Both are significant. She has bedwetting. She soaks 3 or 4 pads a day.  She has chronic renal insufficiency.  On pelvic examination the patient had grade 1 hypermobility of the bladder neck and a negative cough test. She did not of prolapse. She cannot cough hard. 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 I do not think she had lichen sclerosis but there may be an element of such  Incontinence has persisted in spite of taking Myrbetriq.  Cysto: normal  My plan was to order urodynamics if she did not respond to the second pill Detrol. The patient is a partial responder but still has urgency incontinence and bedwetting in frequency stable.  The patient has mixed incontinence and bedwetting. My plan was to order urodynamics if she did not respond to the second pill Detrol. The patient is a partial responder but still has urgency incontinence and bedwetting in frequency stable.   On urodynamics the patient emptied efficiently. Maximum bladder capacity was 212 mL. Patient had sensory urgency. Bladder was unstable reaching a pressure of 29 cm of water and she had to void off the contraction. She triggered the contraction. She had no stress incontinence at a pressure of 88 cm of water. She voided 57 mils of maximal flow 4 mils per second. Voiding pressure 16 cm of water. Residual was 95 mL. This may not have been a true voluntary void. EMG activity increased mildly. Bladder neck descent at 1 cm.   The patient has an overactive bladder.  Approximately 90% of her problem is such. If she does not reach her treatment goal with a third medication I would review refractory therapies with her.  Reassess on Toviaz  Today Incontinence significantly improved on Toviaz 8 mg.  Clinically not infected.  Still wears 2 pads but only mild leakage.  She would like to stay on it.  It was difficult to sort out the history today in detail.  In the last month she has daily bilateral back discomfort.  In the last 2 weeks she has more of an epigastric pain.  Both were nonspecific.  She wondered if she had a bladder infection but she did not have other cystitis symptoms.  She stopped the Gala Murdoch thinking it might be the cause of the pain.  She said she was a partial responder.  Modifying factors: There are no other modifying factors  Associated signs and symptoms: There are no other associated signs and symptoms Aggravating and relieving factors: There are no other aggravating or relieving factors Severity: Moderate Duration: Persistent  No bladder or CVA tenderness  Urine sent for culture    PMH: Past Medical History:  Diagnosis Date  . Arthritis   . Asthma   . Depression   . GERD (gastroesophageal reflux disease)   . Headache   . History of hiatal hernia   . Hyperlipidemia   . Hyperlipidemia   . Hypertension   . IBS (irritable bowel syndrome)   . Neuromuscular disorder [facility])    neuropathy    Surgical History: Past Surgical History:  Procedure Laterality Date  .  ABDOMINAL HYSTERECTOMY    . APPENDECTOMY    . BREAST BIOPSY Left 07/2013   neg  . CHOLECYSTECTOMY    . ESOPHAGOGASTRODUODENOSCOPY (EGD) WITH PROPOFOL N/A 03/16/2018   Procedure: ESOPHAGOGASTRODUODENOSCOPY (EGD) WITH PROPOFOL;  Surgeon: Toledo, Boykin Nearingeodoro K, MD;  Location: [facility] ENDOSCOPY;  Service: Gastroenterology;  Laterality: N/A;  . EYE SURGERY     cataracts  . KNEE ARTHROSCOPY    . SHOULDER ARTHROSCOPY Left     Home Medications:  Allergies as of 09/12/2018       Reactions   Aspirin Other (See Comments)   Epitaxis    Atorvastatin Other (See Comments)   Mirabegron Itching   Statins Other (See Comments)   Acetaminophen Palpitations      Medication List       Accurate as of September 12, 2018  3:06 PM. Always use your most recent med list.        B-12 5000 MCG Caps Take by mouth daily.   baclofen 10 MG tablet Commonly known as:  LIORESAL Take 5 mg by mouth.   Calcium Carbonate-Vitamin D 600-400 MG-UNIT tablet Take by mouth.   [facility] BISACODYL 10 MG suppository Generic drug:  bisacodyl PLACE 1 SUPPOSITORY (10 MG TOTAL) RECTALLY ONCE DAILY AS NEEDED FOR CONSTIPATION   doxycycline 100 MG tablet Commonly known as:  VIBRA-TABS Take 1 tablet (100 mg total) by mouth 2 (two) times daily.   DULoxetine 60 MG capsule Commonly known as:  CYMBALTA duloxetine 60 mg capsule,delayed release   DULoxetine 60 MG capsule Commonly known as:  CYMBALTA TAKE 1 CAPSULE (60 MG TOTAL) BY MOUTH ONCE DAILY.   fesoterodine 8 MG Tb24 tablet Commonly known as:  TOVIAZ Take 1 tablet (8 mg total) by mouth daily.   fesoterodine 8 MG Tb24 tablet Commonly known as:  TOVIAZ Take 1 tablet (8 mg total) by mouth daily.   furosemide 20 MG tablet Commonly known as:  LASIX furosemide 20 mg tablet   gabapentin 300 MG capsule Commonly known as:  NEURONTIN   KLOR-CON M10 10 MEQ tablet Generic drug:  potassium chloride Klor-Con M10 mEq tablet,extended release   LINZESS 145 MCG Caps capsule Generic drug:  linaclotide   ofloxacin 0.3 % ophthalmic solution Commonly known as:  OCUFLOX ofloxacin 0.3 % eye drops   pantoprazole 40 MG tablet Commonly known as:  PROTONIX Take by mouth.   PROAIR HFA 108 (90 Base) MCG/ACT inhaler Generic drug:  albuterol Inhale into the lungs.   temazepam 30 MG capsule Commonly known as:  RESTORIL Take by mouth.   tolterodine 4 MG 24 hr capsule Commonly known as:  DETROL LA Take 1 capsule (4 mg total) by mouth daily.     traMADol 50 MG tablet Commonly known as:  ULTRAM tramadol 50 mg tablet   triamterene-hydrochlorothiazide 37.5-25 MG tablet Commonly known as:  MAXZIDE-25 triamterene 37.5 mg-hydrochlorothiazide 25 mg tablet   vitamin C 1000 MG tablet Take by mouth.   Vitamin D3 50 MCG (2000 UT) capsule Take by mouth.   VITAMIN D (CHOLECALCIFEROL) PO Take by mouth.       Allergies:  Allergies  Allergen Reactions  . Aspirin Other (See Comments)    Epitaxis   . Atorvastatin Other (See Comments)  . Mirabegron Itching  . Statins Other (See Comments)  . Acetaminophen Palpitations    Family History: Family History  Problem Relation Age of Onset  . Diabetes Mother   . Diabetes Brother   . Hypertension Brother   . Breast cancer Neg Hx  Social History:  reports that she has never smoked. She has never used smokeless tobacco. She reports that she does not drink alcohol or use drugs.  ROS: UROLOGY Frequent Urination?: Yes Hard to postpone urination?: Yes Burning/pain with urination?: Yes Get up at night to urinate?: Yes Leakage of urine?: Yes Urine stream starts and stops?: No Trouble starting stream?: Yes Do you have to strain to urinate?: No Blood in urine?: No Urinary tract infection?: Yes Sexually transmitted disease?: No Injury to kidneys or bladder?: No Painful intercourse?: No Weak stream?: No Currently pregnant?: No Vaginal bleeding?: No Last menstrual period?: n  Gastrointestinal Nausea?: No Vomiting?: No Indigestion/heartburn?: Yes Diarrhea?: No Constipation?: No  Constitutional Fever: No Night sweats?: No Weight loss?: No Fatigue?: Yes  Skin Skin rash/lesions?: Yes Itching?: No  Eyes Blurred vision?: No Double vision?: No  Ears/Nose/Throat Sore throat?: No Sinus problems?: No  Hematologic/Lymphatic Swollen glands?: No Easy bruising?: Yes  Cardiovascular Leg swelling?: No Chest pain?: No  Respiratory Cough?: No Shortness of breath?:  No  Endocrine Excessive thirst?: Yes  Musculoskeletal Back pain?: Yes Joint pain?: No  Neurological Headaches?: No Dizziness?: No  Psychologic Depression?: No Anxiety?: No  Physical Exam: BP 131/82 (BP Location: Left Arm, Patient Position: Sitting)   Pulse 71   Ht 5\' 4"  (1.626 m)   Wt 175 lb (79.4 kg)   BMI 30.04 kg/m   Constitutional:  Alert and oriented, No acute distress. HEENT: [place] AT, moist mucus membranes.  Trachea midline, no masses. Cardiovascular: No clubbing, cyanosis, or edema. Respiratory: Normal respiratory effort, no increased work of breathing. GI: Abdomen is soft, nontender, nondistended, no abdominal masses GU: No CVA tenderness.  Skin: No rashes, bruises or suspicious lesions. Lymph: No cervical or inguinal adenopathy. Neurologic: Grossly intact, no focal deficits, moving all 4 extremities. Psychiatric: Normal mood and affect.  Laboratory Data: Lab Results  Component Value Date   WBC 6.1 07/19/2016   HGB 14.7 07/19/2016   HCT 43.3 07/19/2016   MCV 88.2 07/19/2016   PLT 163 07/19/2016    Lab Results  Component Value Date   CREATININE 1.24 (H) 07/19/2016    No results found for: PSA  No results found for: TESTOSTERONE  No results found for: HGBA1C  Urinalysis    Component Value Date/Time   COLORURINE YELLOW (A) 07/19/2016 1620   APPEARANCEUR Cloudy (A) 12/06/2017 1411   LABSPEC 1.012 07/19/2016 1620   PHURINE 6.0 07/19/2016 1620   GLUCOSEU Negative 12/06/2017 1411   HGBUR NEGATIVE 07/19/2016 1620   BILIRUBINUR Negative 12/06/2017 1411   KETONESUR NEGATIVE 07/19/2016 1620   PROTEINUR Negative 12/06/2017 1411   PROTEINUR NEGATIVE 07/19/2016 1620   NITRITE Negative 12/06/2017 1411   NITRITE NEGATIVE 07/19/2016 1620   LEUKOCYTESUR Trace (A) 12/06/2017 1411    Pertinent Imaging:   Assessment & Plan: Patient has ongoing urge incontinence with minimal stress incontinence.  I talked about the 3 refractory therapies.  She may have  been a little bit overwhelmed.  She will stay on Toviaz.  Prescription renewed.  Usual template and handout discussed and given.  Call if urine culture is positive.  I do not think she has infection.  I will get a CT scan for back and abdominal pain and call if abnormal.  Reassess in 3 months.  I would not be surprised if she does not try refractory therapy  1. Dysuria  - Urinalysis, Complete   No follow-ups on file.  Martina Sinner, MD  [facility] [address]  [place], [place] [address] [phone]

## 2018-09-14 LAB — CULTURE, URINE COMPREHENSIVE

## 2018-09-16 ENCOUNTER — Telehealth: Payer: Self-pay

## 2018-09-16 NOTE — Telephone Encounter (Signed)
Called and advised patient of negative urine culture per Dr Clint Guy request

## 2018-10-03 ENCOUNTER — Ambulatory Visit
Admission: RE | Admit: 2018-10-03 | Discharge: 2018-10-03 | Disposition: A | Discharge status: Home / Self Care | Payer: Medicare Other | Source: Ambulatory Visit | Attending: Urology | Admitting: Urology

## 2018-10-03 DIAGNOSIS — R3129 Other microscopic hematuria: Secondary | ICD-10-CM | POA: Insufficient documentation

## 2018-10-03 LAB — POCT I-STAT CREATININE: Creatinine, Ser: 1.1 mg/dL — ABNORMAL HIGH (ref 0.44–1.00)

## 2018-10-03 MED ORDER — IOHEXOL 300 MG/ML  SOLN
125.0000 mL | Freq: Once | INTRAMUSCULAR | Status: AC | PRN
  Administered 2018-10-03: 125 mL via INTRAVENOUS

## 2018-12-12 ENCOUNTER — Ambulatory Visit: Payer: Commercial Managed Care - HMO | Admitting: Urology

## 2019-01-16 ENCOUNTER — Ambulatory Visit: Payer: Self-pay | Admitting: Urology

## 2019-01-16 ENCOUNTER — Other Ambulatory Visit: Payer: Self-pay | Admitting: Neurology

## 2019-01-16 ENCOUNTER — Encounter: Payer: Self-pay | Admitting: Urology

## 2019-01-16 DIAGNOSIS — M5136 Other intervertebral disc degeneration, lumbar region: Secondary | ICD-10-CM

## 2019-01-25 ENCOUNTER — Other Ambulatory Visit: Payer: Self-pay

## 2019-01-25 ENCOUNTER — Ambulatory Visit
Admission: RE | Admit: 2019-01-25 | Discharge: 2019-01-25 | Disposition: A | Discharge status: Home / Self Care | Payer: Medicare Other | Source: Ambulatory Visit | Attending: Neurology | Admitting: Neurology

## 2019-01-25 DIAGNOSIS — M5136 Other intervertebral disc degeneration, lumbar region: Secondary | ICD-10-CM | POA: Insufficient documentation

## 2019-02-20 ENCOUNTER — Other Ambulatory Visit: Payer: Self-pay

## 2019-02-20 ENCOUNTER — Encounter: Payer: Self-pay | Admitting: Urology

## 2019-02-20 ENCOUNTER — Ambulatory Visit: Payer: Medicare Other | Admitting: Urology

## 2019-02-20 VITALS — BP 149/77 | HR 93 | Ht 64.0 in | Wt 191.0 lb

## 2019-02-20 DIAGNOSIS — N3946 Mixed incontinence: Secondary | ICD-10-CM | POA: Diagnosis not present

## 2019-02-20 MED ORDER — FESOTERODINE FUMARATE ER 8 MG PO TB24: 8.0000 mg | ORAL_TABLET | Freq: Every day | ORAL | 11 refills | Status: DC

## 2019-02-20 NOTE — Progress Notes (Signed)
02/20/2019 3:14 PM   Ann Benson 24-Mar-1941 [id number]  Referring provider: Adin Hector, MD [place],  [place] [address]  No chief complaint on file.   HPI: I was consulted to assess the patient's urinary incontinence worsening over many years. She leaks with coughing sneezing bending lifting. She has urge incontinence. Both are significant. She has bedwetting. She soaks 3 or 4 pads a day.  On pelvic examination the patient had grade 1 hypermobility of the bladder neck and a negative cough test. She did not of prolapse. She cannot cough hard. 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 I do not think she had lichen sclerosis but there may be an element of such  fail Myrbetriq and partial detrol On urodynamics the patient emptie d efficiently. Maximum bladder capacity was 212 mL. Patient had sensory urgency. Bladder was unstable reaching a pressure of 29 cm of water and she had to void off the contraction. She triggered the contraction. She had no stress incontinence at a pressure of 88 cm of water. She voided 57 mils of maximal flow 4 mils per second. Voiding pressure 16 cm of water. Residual was 95 mL. This may not have been a true voluntary void. EMG activity increased mildly. Bladder neck descent at 1 cm.   The patient has an overactive bladder. Approximately 90% of her problem is such. If she does not reach her treatment goal with a third medication I would review refractory therapies with her.Reassess on Toviaz  Incontinence significantly improved on Toviaz 8 mg. Clinically not infected. Still wears 2 pads but only mild leakage. She would like to stay on it.  She stopped the Lisbeth Ply thinking it might be the cause of the back pain.  She said she was a partial responder.  Patient has ongoing urge incontinence with minimal stress incontinence.  I talked about the 3 refractory  therapies.  She may have been a little bit overwhelmed.  She will stay on Toviaz.  Prescription renewed.  Usual template and handout discussed and given.  Call if urine culture is positive.  I do not think she has infection.  I will get a CT scan for back and abdominal pain and call if abnormal.  Reassess in 3 months.  I would not be surprised if she does not try refractory therapy  Today Frequency stable.  Urge incontinence partial responder improved.  Does not want refractory therapy.  Prescription renewed and I will see in 1 year clinically not infected  PMH: Past Medical History:  Diagnosis Date  . Arthritis   . Asthma   . Depression   . GERD (gastroesophageal reflux disease)   . Headache   . History of hiatal hernia   . Hyperlipidemia   . Hyperlipidemia   . Hypertension   . IBS (irritable bowel syndrome)   . Neuromuscular disorder [facility])    neuropathy    Surgical History: Past Surgical History:  Procedure Laterality Date  . ABDOMINAL HYSTERECTOMY    . APPENDECTOMY    . BREAST BIOPSY Left 07/2013   neg  . CHOLECYSTECTOMY    . ESOPHAGOGASTRODUODENOSCOPY (EGD) WITH PROPOFOL N/A 03/16/2018   Procedure: ESOPHAGOGASTRODUODENOSCOPY (EGD) WITH PROPOFOL;  Surgeon: Toledo, Benay Pike, MD;  Location: [facility] ENDOSCOPY;  Service: Gastroenterology;  Laterality: N/A;  . EYE SURGERY     cataracts  . KNEE ARTHROSCOPY    . SHOULDER ARTHROSCOPY Left     Home Medications:  Allergies as of 02/20/2019      Reactions   Aspirin Other (See Comments)   Epitaxis    Atorvastatin Other (See Comments)   Mirabegron Itching   Statins Other (See Comments)   Acetaminophen Palpitations      Medication List       Accurate as of February 20, 2019  3:14 PM. If you have any questions, ask your nurse or doctor.        B-12 5000 MCG Caps Take by mouth daily.   baclofen 10 MG tablet Commonly known as: LIORESAL Take 5 mg by mouth.   Calcium Carbonate-Vitamin D 600-400 MG-UNIT tablet Take by mouth.    [facility] Bisacodyl 10 MG suppository Generic drug: bisacodyl PLACE 1 SUPPOSITORY (10 MG TOTAL) RECTALLY ONCE DAILY AS NEEDED FOR CONSTIPATION   doxycycline 100 MG tablet Commonly known as: VIBRA-TABS Take 1 tablet (100 mg total) by mouth 2 (two) times daily.   DULoxetine 60 MG capsule Commonly known as: CYMBALTA duloxetine 60 mg capsule,delayed release   DULoxetine 60 MG capsule Commonly known as: CYMBALTA TAKE 1 CAPSULE (60 MG TOTAL) BY MOUTH ONCE DAILY.   fesoterodine 8 MG Tb24 tablet Commonly known as: TOVIAZ Take 1 tablet (8 mg total) by mouth daily.   fesoterodine 8 MG Tb24 tablet Commonly known as: TOVIAZ Take 1 tablet (8 mg total) by mouth daily.   furosemide 20 MG tablet Commonly known as: LASIX furosemide 20 mg tablet   gabapentin 300 MG capsule Commonly known as: NEURONTIN   Klor-Con M10 10 MEQ tablet Generic drug: potassium chloride Klor-Con M10 mEq tablet,extended release   Linzess 145 MCG Caps capsule Generic drug: linaclotide   ofloxacin 0.3 % ophthalmic solution Commonly known as: OCUFLOX ofloxacin 0.3 % eye drops   pantoprazole 40 MG tablet Commonly known as: PROTONIX Take by mouth.   ProAir HFA 108 (90 Base) MCG/ACT inhaler Generic drug: albuterol Inhale into the lungs.   temazepam 30 MG capsule Commonly known as: RESTORIL Take by mouth.   tolterodine 4 MG 24 hr capsule Commonly known as: Detrol LA Take 1 capsule (4 mg total) by mouth daily.   traMADol 50 MG tablet Commonly known as: ULTRAM tramadol 50 mg tablet   triamterene-hydrochlorothiazide 37.5-25 MG tablet Commonly known as: MAXZIDE-25 triamterene 37.5 mg-hydrochlorothiazide 25 mg tablet   vitamin C 1000 MG tablet Take by mouth.   Vitamin D3 50 MCG (2000 UT) capsule Take by mouth.   VITAMIN D (CHOLECALCIFEROL) PO Take by mouth.       Allergies:  Allergies  Allergen Reactions  . Aspirin Other (See Comments)    Epitaxis   . Atorvastatin Other (See Comments)  .  Mirabegron Itching  . Statins Other (See Comments)  . Acetaminophen Palpitations    Family History: Family History  Problem Relation Age of Onset  . Diabetes Mother   . Diabetes Brother   . Hypertension Brother   . Breast cancer Neg Hx     Social History:  reports that she has never smoked. She has never used smokeless tobacco. She reports that she does not drink alcohol or use drugs.  ROS:                                        Physical Exam: There were no vitals taken for this visit.  Constitutional:  Alert and oriented, No acute distress.  Laboratory Data: Lab Results  Component  Value Date   WBC 6.1 07/19/2016   HGB 14.7 07/19/2016   HCT 43.3 07/19/2016   MCV 88.2 07/19/2016   PLT 163 07/19/2016    Lab Results  Component Value Date   CREATININE 1.10 (H) 10/03/2018    No results found for: PSA  No results found for: TESTOSTERONE  No results found for: HGBA1C  Urinalysis    Component Value Date/Time   COLORURINE YELLOW (A) 07/19/2016 1620   APPEARANCEUR Cloudy (A) 09/12/2018 1432   LABSPEC 1.012 07/19/2016 1620   PHURINE 6.0 07/19/2016 1620   GLUCOSEU Negative 09/12/2018 1432   HGBUR NEGATIVE 07/19/2016 1620   BILIRUBINUR Negative 09/12/2018 1432   KETONESUR NEGATIVE 07/19/2016 1620   PROTEINUR Negative 09/12/2018 1432   PROTEINUR NEGATIVE 07/19/2016 1620   NITRITE Negative 09/12/2018 1432   NITRITE NEGATIVE 07/19/2016 1620   LEUKOCYTESUR Negative 09/12/2018 1432    Pertinent Imaging:   Assessment & Plan: See in 1 year  There are no diagnoses linked to this encounter.  No follow-ups on file.  Martina SinnerScott A Macdiarmid, MD  [facility] [address], [address] [place], [place] [address] [phone]

## 2019-02-21 ENCOUNTER — Telehealth: Payer: Self-pay | Admitting: Family Medicine

## 2019-02-21 NOTE — Telephone Encounter (Signed)
The pharmacy faxed a denial on the Toviaz 8mg . I spoke to the Pharmacist and she states her insurance has changed at the beginning of the year and they will not cover this medication.  She also states she has not gotten this medication filled since 04/2018. Do you want he to try a different medication?

## 2019-02-22 MED ORDER — OXYBUTYNIN CHLORIDE ER 10 MG PO TB24: 10.0000 mg | ORAL_TABLET | Freq: Every day | ORAL | 11 refills | Status: AC

## 2019-02-22 NOTE — Telephone Encounter (Signed)
You can ask her what she has taken before but she can have Detrol LA 4 mg 30 tablets and 11 refills or oxybutynin ER 10 mg 30 tablets and 11 refills

## 2019-02-22 NOTE — Telephone Encounter (Signed)
Medication has been changed to Oxybutynin.

## 2019-03-15 ENCOUNTER — Ambulatory Visit: Admit: 2019-03-15 | Payer: Medicare Other | Admitting: Internal Medicine

## 2019-03-15 SURGERY — ESOPHAGOGASTRODUODENOSCOPY (EGD) WITH PROPOFOL
Anesthesia: General

## 2019-06-08 ENCOUNTER — Other Ambulatory Visit: Payer: Self-pay | Admitting: Gastroenterology

## 2019-06-08 DIAGNOSIS — R1310 Dysphagia, unspecified: Secondary | ICD-10-CM

## 2019-06-08 DIAGNOSIS — D509 Iron deficiency anemia, unspecified: Secondary | ICD-10-CM

## 2019-06-08 DIAGNOSIS — R1319 Other dysphagia: Secondary | ICD-10-CM

## 2019-06-08 DIAGNOSIS — R490 Dysphonia: Secondary | ICD-10-CM

## 2019-06-15 ENCOUNTER — Ambulatory Visit
Admission: RE | Admit: 2019-06-15 | Discharge: 2019-06-15 | Disposition: A | Discharge status: Home / Self Care | Payer: Medicare Other | Source: Ambulatory Visit | Attending: Gastroenterology | Admitting: Gastroenterology

## 2019-06-15 ENCOUNTER — Other Ambulatory Visit: Payer: Self-pay

## 2019-06-15 DIAGNOSIS — R1319 Other dysphagia: Secondary | ICD-10-CM

## 2019-06-15 DIAGNOSIS — D509 Iron deficiency anemia, unspecified: Secondary | ICD-10-CM | POA: Insufficient documentation

## 2019-06-15 DIAGNOSIS — R1310 Dysphagia, unspecified: Secondary | ICD-10-CM | POA: Insufficient documentation

## 2019-06-15 DIAGNOSIS — R490 Dysphonia: Secondary | ICD-10-CM | POA: Diagnosis present

## 2019-07-25 ENCOUNTER — Other Ambulatory Visit: Payer: Self-pay | Admitting: Internal Medicine

## 2019-10-09 ENCOUNTER — Other Ambulatory Visit: Admission: RE | Admit: 2019-10-09 | Payer: Medicare Other | Source: Ambulatory Visit

## 2019-10-11 ENCOUNTER — Ambulatory Visit: Admission: RE | Admit: 2019-10-11 | Payer: Medicare Other | Source: Ambulatory Visit | Admitting: Internal Medicine

## 2019-10-11 ENCOUNTER — Encounter: Admission: RE | Payer: Self-pay | Source: Ambulatory Visit

## 2019-10-11 SURGERY — COLONOSCOPY WITH PROPOFOL
Anesthesia: General

## 2019-10-26 ENCOUNTER — Encounter: Payer: Self-pay | Admitting: Podiatry

## 2019-10-26 ENCOUNTER — Ambulatory Visit: Payer: PPO | Admitting: Podiatry

## 2019-10-26 ENCOUNTER — Other Ambulatory Visit: Payer: Self-pay

## 2019-10-26 DIAGNOSIS — E1142 Type 2 diabetes mellitus with diabetic polyneuropathy: Secondary | ICD-10-CM

## 2019-10-26 DIAGNOSIS — I739 Peripheral vascular disease, unspecified: Secondary | ICD-10-CM

## 2019-10-26 DIAGNOSIS — L603 Nail dystrophy: Secondary | ICD-10-CM

## 2019-10-26 DIAGNOSIS — E1140 Type 2 diabetes mellitus with diabetic neuropathy, unspecified: Secondary | ICD-10-CM | POA: Insufficient documentation

## 2019-10-26 NOTE — Progress Notes (Signed)
This patient returns to my office for at risk foot care.  This patient requires this care by a professional since this patient will be at risk due to having lymphedema, CKD-3, claudication,  PAD and diabetes with neuropathy.  She is concerned that her big toe nails are painful.  She also had the nail removed in 2019.  No redness or drainage noted.  This patient is unable to cut nails himself since the patient cannot reach his nails.These nails are painful walking and wearing shoes.  This patient presents for at risk foot care today.  General Appearance  Alert, conversant and in no acute stress.  Vascular  Dorsalis pedis  pulses are palpable  Bilaterally.  Posterior tibial pulses are weakly palpable  B/L.  Capillary return is within normal limits  bilaterally. Temperature is within normal limits  bilaterally.  Neurologic  Senn-Weinstein monofilament wire test diminished  bilaterally. Muscle power within normal limits bilaterally.  Nails Thick disfigured discolored nails with subungual debris  Hallux nails  bilaterally. No evidence of bacterial infection or drainage bilaterally.  Dystropic nails noted hallux  B/L  Orthopedic  No limitations of motion  feet .  No crepitus or effusions noted.  No bony pathology or digital deformities noted.  Skin  normotropic skin with no porokeratosis noted bilaterally.  No signs of infections or ulcers noted.     Dystropic nails  Hallux  B/L.  Pain in right toes  Pain in left toes  Consent was obtained for treatment procedures.   Mechanical debridement of hallux  nails    bilaterally performed with a nail nipper.  Filed with dremel without incident. No infection or ulcer.     Return office visit    3 months                 Told patient to return for periodic foot care and evaluation due to potential at risk complications.   Helane Gunther DPM

## 2020-01-29 ENCOUNTER — Ambulatory Visit: Payer: Medicare Other | Admitting: Podiatry

## 2020-02-26 ENCOUNTER — Ambulatory Visit: Payer: Medicare Other | Admitting: Urology

## 2020-03-15 ENCOUNTER — Other Ambulatory Visit: Payer: Self-pay

## 2020-03-15 ENCOUNTER — Inpatient Hospital Stay
Admission: EM | Admit: 2020-03-15 | Discharge: 2020-03-20 | DRG: 571 | Disposition: A | Discharge status: Home Health | Payer: Medicare Other | Attending: Internal Medicine | Admitting: Internal Medicine

## 2020-03-15 ENCOUNTER — Encounter: Payer: Self-pay | Admitting: Emergency Medicine

## 2020-03-15 DIAGNOSIS — N61 Mastitis without abscess: Secondary | ICD-10-CM

## 2020-03-15 DIAGNOSIS — Z20822 Contact with and (suspected) exposure to covid-19: Secondary | ICD-10-CM | POA: Diagnosis present

## 2020-03-15 DIAGNOSIS — Z888 Allergy status to other drugs, medicaments and biological substances status: Secondary | ICD-10-CM

## 2020-03-15 DIAGNOSIS — K529 Noninfective gastroenteritis and colitis, unspecified: Secondary | ICD-10-CM | POA: Diagnosis not present

## 2020-03-15 DIAGNOSIS — N611 Abscess of the breast and nipple: Secondary | ICD-10-CM | POA: Diagnosis not present

## 2020-03-15 DIAGNOSIS — K219 Gastro-esophageal reflux disease without esophagitis: Secondary | ICD-10-CM | POA: Diagnosis present

## 2020-03-15 DIAGNOSIS — J449 Chronic obstructive pulmonary disease, unspecified: Secondary | ICD-10-CM | POA: Diagnosis present

## 2020-03-15 DIAGNOSIS — L0390 Cellulitis, unspecified: Secondary | ICD-10-CM | POA: Diagnosis present

## 2020-03-15 DIAGNOSIS — N183 Chronic kidney disease, stage 3 unspecified: Secondary | ICD-10-CM | POA: Diagnosis present

## 2020-03-15 DIAGNOSIS — R197 Diarrhea, unspecified: Secondary | ICD-10-CM

## 2020-03-15 DIAGNOSIS — I96 Gangrene, not elsewhere classified: Secondary | ICD-10-CM | POA: Diagnosis present

## 2020-03-15 DIAGNOSIS — Z79899 Other long term (current) drug therapy: Secondary | ICD-10-CM

## 2020-03-15 DIAGNOSIS — E785 Hyperlipidemia, unspecified: Secondary | ICD-10-CM | POA: Diagnosis present

## 2020-03-15 DIAGNOSIS — F329 Major depressive disorder, single episode, unspecified: Secondary | ICD-10-CM | POA: Diagnosis present

## 2020-03-15 DIAGNOSIS — K589 Irritable bowel syndrome without diarrhea: Secondary | ICD-10-CM | POA: Diagnosis present

## 2020-03-15 DIAGNOSIS — I739 Peripheral vascular disease, unspecified: Secondary | ICD-10-CM | POA: Diagnosis present

## 2020-03-15 DIAGNOSIS — E1151 Type 2 diabetes mellitus with diabetic peripheral angiopathy without gangrene: Secondary | ICD-10-CM | POA: Diagnosis present

## 2020-03-15 DIAGNOSIS — E1142 Type 2 diabetes mellitus with diabetic polyneuropathy: Secondary | ICD-10-CM | POA: Diagnosis present

## 2020-03-15 DIAGNOSIS — R1084 Generalized abdominal pain: Secondary | ICD-10-CM

## 2020-03-15 DIAGNOSIS — G43909 Migraine, unspecified, not intractable, without status migrainosus: Secondary | ICD-10-CM | POA: Diagnosis present

## 2020-03-15 DIAGNOSIS — Z79891 Long term (current) use of opiate analgesic: Secondary | ICD-10-CM

## 2020-03-15 DIAGNOSIS — I129 Hypertensive chronic kidney disease with stage 1 through stage 4 chronic kidney disease, or unspecified chronic kidney disease: Secondary | ICD-10-CM | POA: Diagnosis present

## 2020-03-15 DIAGNOSIS — R195 Other fecal abnormalities: Secondary | ICD-10-CM | POA: Diagnosis present

## 2020-03-15 DIAGNOSIS — Z8249 Family history of ischemic heart disease and other diseases of the circulatory system: Secondary | ICD-10-CM

## 2020-03-15 DIAGNOSIS — N182 Chronic kidney disease, stage 2 (mild): Secondary | ICD-10-CM | POA: Diagnosis present

## 2020-03-15 DIAGNOSIS — M1990 Unspecified osteoarthritis, unspecified site: Secondary | ICD-10-CM | POA: Diagnosis present

## 2020-03-15 DIAGNOSIS — K449 Diaphragmatic hernia without obstruction or gangrene: Secondary | ICD-10-CM | POA: Diagnosis present

## 2020-03-15 DIAGNOSIS — Z886 Allergy status to analgesic agent status: Secondary | ICD-10-CM

## 2020-03-15 DIAGNOSIS — N6310 Unspecified lump in the right breast, unspecified quadrant: Secondary | ICD-10-CM

## 2020-03-15 DIAGNOSIS — E1122 Type 2 diabetes mellitus with diabetic chronic kidney disease: Secondary | ICD-10-CM | POA: Diagnosis present

## 2020-03-15 DIAGNOSIS — Z833 Family history of diabetes mellitus: Secondary | ICD-10-CM

## 2020-03-15 DIAGNOSIS — R112 Nausea with vomiting, unspecified: Secondary | ICD-10-CM | POA: Diagnosis present

## 2020-03-15 DIAGNOSIS — E860 Dehydration: Secondary | ICD-10-CM

## 2020-03-15 DIAGNOSIS — B9562 Methicillin resistant Staphylococcus aureus infection as the cause of diseases classified elsewhere: Secondary | ICD-10-CM | POA: Diagnosis present

## 2020-03-15 DIAGNOSIS — K760 Fatty (change of) liver, not elsewhere classified: Secondary | ICD-10-CM | POA: Diagnosis present

## 2020-03-15 DIAGNOSIS — I10 Essential (primary) hypertension: Secondary | ICD-10-CM | POA: Diagnosis present

## 2020-03-15 DIAGNOSIS — N63 Unspecified lump in unspecified breast: Secondary | ICD-10-CM

## 2020-03-15 LAB — COMPREHENSIVE METABOLIC PANEL
ALT: 18 U/L (ref 0–44)
AST: 22 U/L (ref 15–41)
Albumin: 3.7 g/dL (ref 3.5–5.0)
Alkaline Phosphatase: 51 U/L (ref 38–126)
Anion gap: 9 (ref 5–15)
BUN: 11 mg/dL (ref 8–23)
CO2: 24 mmol/L (ref 22–32)
Calcium: 9.5 mg/dL (ref 8.9–10.3)
Chloride: 109 mmol/L (ref 98–111)
Creatinine, Ser: 0.99 mg/dL (ref 0.44–1.00)
GFR calc Af Amer: >60 mL/min (ref >60)
GFR calc non Af Amer: 53 mL/min — ABNORMAL LOW (ref >60)
Glucose, Bld: 109 mg/dL — ABNORMAL HIGH (ref 70–99)
Potassium: 4 mmol/L (ref 3.5–5.1)
Sodium: 142 mmol/L (ref 135–145)
Total Bilirubin: 1.1 mg/dL (ref 0.3–1.2)
Total Protein: 7.3 g/dL (ref 6.5–8.1)

## 2020-03-15 LAB — CBC
HCT: 44.8 % (ref 36.0–46.0)
Hemoglobin: 15.3 g/dL — ABNORMAL HIGH (ref 12.0–15.0)
MCH: 30 pg (ref 26.0–34.0)
MCHC: 34.2 g/dL (ref 30.0–36.0)
MCV: 87.8 fL (ref 80.0–100.0)
Platelets: 170 10*3/uL (ref 150–400)
RBC: 5.1 MIL/uL (ref 3.87–5.11)
RDW: 13.6 % (ref 11.5–15.5)
WBC: 7.4 10*3/uL (ref 4.0–10.5)
nRBC: 0 % (ref 0.0–0.2)

## 2020-03-15 LAB — LIPASE, BLOOD: Lipase: 19 U/L (ref 11–51)

## 2020-03-15 MED ORDER — ONDANSETRON HCL 4 MG/2ML IJ SOLN
4.0000 mg | Freq: Once | INTRAMUSCULAR | Status: AC
  Administered 2020-03-15: 4 mg via INTRAVENOUS
  Filled 2020-03-15: qty 2

## 2020-03-15 MED ORDER — IOHEXOL 9 MG/ML PO SOLN
500.0000 mL | Freq: Two times a day (BID) | ORAL | Status: DC | PRN
  Administered 2020-03-15 (×2): 500 mL via ORAL
  Filled 2020-03-15: qty 500

## 2020-03-15 NOTE — ED Triage Notes (Signed)
First RN Note: Pt presents to ED via [facility] with c/o N/V x 3 days. 18g to L AC.   Per EMS pt also c/o black tarry stools x 3 days.

## 2020-03-15 NOTE — ED Triage Notes (Addendum)
Pt here for vomiting and diarrhea.  No fever.  PIV from EMS.  Has mid lower abdominal pain.  VSS.  Unlabored. Alert and oriented at this time, NAD. Does report stools have been dark.

## 2020-03-15 NOTE — ED Provider Notes (Addendum)
[facility] Emergency Department Provider Note   ____________________________________________   First MD Initiated Contact with Patient 03/15/20 2148     (approximate)  I have reviewed the triage vital signs and the nursing notes.   HISTORY  Chief Complaint Emesis    HPI MIHAELA FAJARDO is a 79 y.o. female who reports she has had vomiting of green material and diarrhea for the last couple days.  She has had dark stools and abdominal pain which sounds like it is crampy in nature but fairly severe.  She also reports a lump on her right breast which came up several days ago.  She denies any fever.         Past Medical History:  Diagnosis Date  . Arthritis   . Asthma   . Depression   . GERD (gastroesophageal reflux disease)   . Headache   . History of hiatal hernia   . Hyperlipidemia   . Hyperlipidemia   . Hypertension   . IBS (irritable bowel syndrome)   . Neuromuscular disorder [facility])    neuropathy    Patient Active Problem List   Diagnosis Date Noted  . Dystrophic nail 10/26/2019  . Diabetic neuropathy (HCC) 10/26/2019  . Anxiety 01/17/2018  . Hyperlipidemia 01/17/2018  . IBS (irritable bowel syndrome) 01/17/2018  . Intra-abdominal hernia 01/17/2018  . Migraine 01/17/2018  . Lumbar degenerative disc disease 11/30/2017  . Radicular leg pain 09/21/2017  . Lymphedema 05/26/2017  . Leg pain 04/26/2017  . Neuropathy 04/26/2017  . Chronic venous insufficiency 04/26/2017  . PAD (peripheral artery disease) (HCC) 04/26/2017  . Lumbar spondylosis with myelopathy 04/26/2017  . COPD (chronic obstructive pulmonary disease) (HCC) 03/31/2017  . GERD (gastroesophageal reflux disease) 03/31/2017  . Polyneuropathy associated with underlying disease (HCC) 03/04/2017  . Stage 3 chronic kidney disease 03/04/2017  . Acute respiratory failure with hypoxia (HCC) 07/19/2016  . Aortic atherosclerosis (HCC) 04/16/2016  . Claudication (HCC) 04/15/2016   . Recurrent major depressive disorder, in full remission (HCC) 10/10/2015  . Essential hypertension 04/11/2015  . Dysphagia 11/27/2014  . Edema of both legs 12/21/2013    Past Surgical History:  Procedure Laterality Date  . ABDOMINAL HYSTERECTOMY    . APPENDECTOMY    . BREAST BIOPSY Left 07/2013   neg  . CHOLECYSTECTOMY    . ESOPHAGOGASTRODUODENOSCOPY (EGD) WITH PROPOFOL N/A 03/16/2018   Procedure: ESOPHAGOGASTRODUODENOSCOPY (EGD) WITH PROPOFOL;  Surgeon: Toledo, Boykin Nearing, MD;  Location: [facility] ENDOSCOPY;  Service: Gastroenterology;  Laterality: N/A;  . EYE SURGERY     cataracts  . KNEE ARTHROSCOPY    . SHOULDER ARTHROSCOPY Left     Prior to Admission medications   Medication Sig Start Date End Date Taking? Authorizing Provider  albuterol (PROAIR HFA) 108 (90 Base) MCG/ACT inhaler Inhale into the lungs. 04/11/15   [provider]  amoxicillin (AMOXIL) 500 MG capsule amoxicillin 500 mg capsule    [provider]  Ascorbic Acid (VITAMIN C) 1000 MG tablet Take by mouth.    [provider]  baclofen (LIORESAL) 10 MG tablet Take 5 mg by mouth. 05/18/13   [provider]  Calcium Carbonate-Vitamin D 600-400 MG-UNIT tablet Take by mouth.    [provider]  Cholecalciferol (VITAMIN D3) 2000 units capsule Take by mouth.    [provider]  [facility] BISACODYL 10 MG suppository PLACE 1 SUPPOSITORY (10 MG TOTAL) RECTALLY ONCE DAILY AS NEEDED FOR CONSTIPATION 12/31/17   [provider]  Cyanocobalamin (B-12) 5000 MCG CAPS Take  by mouth daily.    [provider]  doxycycline (VIBRA-TABS) 100 MG tablet Take 1 tablet (100 mg total) by mouth 2 (two) times daily. 02/02/18   Hyatt, Max T, DPM  DULoxetine (CYMBALTA) 60 MG capsule TAKE 1 CAPSULE (60 MG TOTAL) BY MOUTH ONCE DAILY. 07/22/15   [provider]  DULoxetine (CYMBALTA) 60 MG capsule duloxetine 60 mg capsule,delayed release    [provider]  fesoterodine (TOVIAZ)  8 MG TB24 tablet Take 1 tablet (8 mg total) by mouth daily. 09/12/18   Alfredo Martinez, MD  fesoterodine (TOVIAZ) 8 MG TB24 tablet Take 1 tablet (8 mg total) by mouth daily. 02/20/19   Alfredo Martinez, MD  furosemide (LASIX) 20 MG tablet furosemide 20 mg tablet    [provider]  gabapentin (NEURONTIN) 300 MG capsule  03/27/17   [provider]  guaifenesin (ROBITUSSIN) 100 MG/5ML syrup Cough Syrup 100 mg/5 mL oral liquid  TAKE 10 MLS (200 MG TOTAL) BY MOUTH EVERY 6 (SIX) HOURS AS NEEDED FOR COUGH OR TO LOOSEN PHLEGM.    [provider]  Karlene Einstein 145 MCG CAPS capsule  03/07/18   [provider]  ofloxacin (OCUFLOX) 0.3 % ophthalmic solution ofloxacin 0.3 % eye drops    [provider]  oxybutynin (DITROPAN-XL) 10 MG 24 hr tablet Take 1 tablet (10 mg total) by mouth daily. 02/22/19   Alfredo Martinez, MD  pantoprazole (PROTONIX) 40 MG tablet Take by mouth. 04/11/15   [provider]  potassium chloride (KLOR-CON M10) 10 MEQ tablet Klor-Con M10 mEq tablet,extended release    [provider]  rosuvastatin (CRESTOR) 5 MG tablet Take 5 mg by mouth once a week. 08/16/19   [provider]  temazepam (RESTORIL) 15 MG capsule TAKE 2 CAPSULES (30 MG TOTAL) BY MOUTH NIGHTLY AS NEEDED FOR SLEEP 10/10/19   [provider]  tolterodine (DETROL LA) 4 MG 24 hr capsule Take 1 capsule (4 mg total) by mouth daily. 12/06/17   Alfredo Martinez, MD  traMADol (ULTRAM) 50 MG tablet tramadol 50 mg tablet    [provider]  triamterene-hydrochlorothiazide (MAXZIDE-25) 37.5-25 MG tablet triamterene 37.5 mg-hydrochlorothiazide 25 mg tablet    [provider]  VITAMIN D, CHOLECALCIFEROL, PO Take by mouth.    [provider]    Allergies Aspirin, Atorvastatin, Mirabegron, Statins, and Acetaminophen  Family History  Problem Relation Age of Onset  . Diabetes Mother   . Diabetes Brother   . Hypertension Brother   . Breast  cancer Neg Hx     Social History Social History   Tobacco Use  . Smoking status: Never Smoker  . Smokeless tobacco: Never Used  Substance Use Topics  . Alcohol use: No    Alcohol/week: 0.0 standard drinks  . Drug use: No    Review of Systems  Constitutional: No fever/chills Eyes: No visual changes. ENT: No sore throat. Cardiovascular: Denies chest pain. Respiratory: Denies shortness of breath. Gastrointestinal: abdominal pain.  nausea, vomiting. diarrhea.  No constipation. Genitourinary: Negative for dysuria. Musculoskeletal: Negative for back pain. Skin: Negative for rash. Neurological: Negative for headaches, focal weakness  ____________________________________________   PHYSICAL EXAM:  VITAL SIGNS: ED Triage Vitals  Enc Vitals Group     BP 03/15/20 1134 (!) 146/78     Pulse Rate 03/15/20 1133 64     Resp 03/15/20 1133 18     Temp 03/15/20 1134 97.8 F (36.6 C)     Temp Source 03/15/20 1134 Oral  SpO2 03/15/20 1133 97 %     Weight 03/15/20 1135 176 lb (79.8 kg)     Height 03/15/20 1135 5\' 4"  (1.626 m)     Head Circumference --      Peak Flow --      Pain Score 03/15/20 1134 8     Pain Loc --      Pain Edu? --      Excl. in GC? --     Constitutional: Alert and oriented. Well appearing and in no acute distress. Eyes: Conjunctivae are normal.  Head: Atraumatic. Nose: No congestion/rhinnorhea. Mouth/Throat: Mucous membranes are moist.  Oropharynx non-erythematous. Neck: No stridor.  Cardiovascular: Normal rate, regular rhythm. Grossly normal heart sounds.  Good peripheral circulation. Respiratory: Normal respiratory effort.  No retractions. Lungs CTAB. Gastrointestinal: Soft diffusely tender to palpation. No distention. No abdominal bruits. No CVA tenderness. Musculoskeletal: No lower extremity tenderness nor edema.  No joint effusions. Neurologic:  Normal speech and language. No gross focal neurologic deficits are appreciated. No gait  instability. Skin:  Skin is warm, dry and intact except for on the right breast were in the lower outer quadrant there is a silver dollar size red area which is slightly firm and draining pus.. No rash noted.   ____________________________________________   LABS (all labs ordered are listed, but only abnormal results are displayed)  Labs Reviewed  COMPREHENSIVE METABOLIC PANEL - Abnormal; Notable for the following components:      Result Value   Glucose, Bld 109 (*)    GFR calc non Af Amer 53 (*)    All other components within normal limits  CBC - Abnormal; Notable for the following components:   Hemoglobin 15.3 (*)    All other components within normal limits  C DIFFICILE QUICK SCREEN W PCR REFLEX  GASTROINTESTINAL PANEL BY PCR, STOOL (REPLACES STOOL CULTURE)  AEROBIC CULTURE (SUPERFICIAL SPECIMEN)  LIPASE, BLOOD  URINALYSIS, COMPLETE (UACMP) WITH MICROSCOPIC  TROPONIN I (HIGH SENSITIVITY)   ____________________________________________  EKG   ____________________________________________  RADIOLOGY  ED MD interpretation:    Official radiology report(s): No results found.  ____________________________________________   PROCEDURES  Procedure(s) performed (including Critical Care):  Procedures   ____________________________________________   INITIAL IMPRESSION / ASSESSMENT AND PLAN / ED COURSE Patient with nausea vomiting diarrhea diffuse abdominal pain and breast abscess.  She does not have Hemoccult positive stools.  She does not have a fever.  She is fairly old and does not look well.  We may need to admit this patient.  CT is still pending.  We will see how she does drinking the contrast.  Additionally she will need some antibiotics for her breast abscess.  I will sign the patient out to Dr. 05/15/20. We will have to check for colitis, obstruction, diverticulitis and abscess and even the possibility for heart disease.              ____________________________________________   FINAL CLINICAL IMPRESSION(S) / ED DIAGNOSES  Final diagnoses:  Nausea vomiting and diarrhea  Generalized abdominal pain  Abscess of breast, postpartum     ED Discharge Orders    None       Note:  This document was prepared using Dragon voice recognition software and may include unintentional dictation errors.    Dolores Frame, MD 03/15/20 05/15/20    [id number], MD 03/15/20 [phone]

## 2020-03-15 NOTE — ED Notes (Signed)
Pt states coming in due to nausea and vomiting and diarrhea with lower abdominal pain that started this past Wednesday. Pt states unable to keep anything down in that time frame as well.

## 2020-03-15 NOTE — ED Notes (Signed)
In and out catheter completed by Lurena Joiner, RN

## 2020-03-16 ENCOUNTER — Emergency Department: Payer: Medicare Other

## 2020-03-16 ENCOUNTER — Encounter: Payer: Self-pay | Admitting: Radiology

## 2020-03-16 DIAGNOSIS — K529 Noninfective gastroenteritis and colitis, unspecified: Secondary | ICD-10-CM

## 2020-03-16 DIAGNOSIS — N6310 Unspecified lump in the right breast, unspecified quadrant: Secondary | ICD-10-CM

## 2020-03-16 LAB — CREATININE, SERUM
Creatinine, Ser: 0.92 mg/dL (ref 0.44–1.00)
GFR calc Af Amer: >60 mL/min (ref >60)
GFR calc non Af Amer: 58 mL/min — ABNORMAL LOW (ref >60)

## 2020-03-16 LAB — URINALYSIS, COMPLETE (UACMP) WITH MICROSCOPIC
Bacteria, UA: NONE SEEN
Bilirubin Urine: NEGATIVE
Glucose, UA: NEGATIVE mg/dL
Hgb urine dipstick: NEGATIVE
Ketones, ur: 5 mg/dL — AB
Leukocytes,Ua: NEGATIVE
Nitrite: NEGATIVE
Protein, ur: NEGATIVE mg/dL
Specific Gravity, Urine: 1.01 (ref 1.005–1.030)
pH: 7 (ref 5.0–8.0)

## 2020-03-16 LAB — LACTIC ACID, PLASMA
Lactic Acid, Venous: 1.5 mmol/L (ref 0.5–1.9)
Lactic Acid, Venous: 1.1 mmol/L (ref 0.5–1.9)

## 2020-03-16 LAB — CBC
HCT: 44.7 % (ref 36.0–46.0)
Hemoglobin: 15.6 g/dL — ABNORMAL HIGH (ref 12.0–15.0)
MCH: 30.7 pg (ref 26.0–34.0)
MCHC: 34.9 g/dL (ref 30.0–36.0)
MCV: 88 fL (ref 80.0–100.0)
Platelets: 180 10*3/uL (ref 150–400)
RBC: 5.08 MIL/uL (ref 3.87–5.11)
RDW: 13.6 % (ref 11.5–15.5)
WBC: 9.2 10*3/uL (ref 4.0–10.5)
nRBC: 0 % (ref 0.0–0.2)

## 2020-03-16 LAB — SARS CORONAVIRUS 2 BY RT PCR (HOSPITAL ORDER, PERFORMED IN ~~LOC~~ HOSPITAL LAB): SARS Coronavirus 2: NEGATIVE

## 2020-03-16 LAB — TROPONIN I (HIGH SENSITIVITY): Troponin I (High Sensitivity): 6 ng/L (ref <18)

## 2020-03-16 MED ORDER — CLINDAMYCIN PHOSPHATE 600 MG/50ML IV SOLN
600.0000 mg | Freq: Once | INTRAVENOUS | Status: AC
  Administered 2020-03-16: 600 mg via INTRAVENOUS
  Filled 2020-03-16 (×2): qty 50

## 2020-03-16 MED ORDER — CLINDAMYCIN PHOSPHATE 600 MG/50ML IV SOLN: 600.0000 mg | Freq: Three times a day (TID) | INTRAVENOUS | Status: DC

## 2020-03-16 MED ORDER — ROSUVASTATIN CALCIUM 5 MG PO TABS
5.0000 mg | ORAL_TABLET | ORAL | Status: DC
  Administered 2020-03-16: 5 mg via ORAL
  Filled 2020-03-16: qty 1

## 2020-03-16 MED ORDER — ONDANSETRON HCL 4 MG/2ML IJ SOLN: 4.0000 mg | Freq: Once | INTRAMUSCULAR | Status: AC

## 2020-03-16 MED ORDER — SODIUM CHLORIDE 0.9 % IV BOLUS
1000.0000 mL | Freq: Once | INTRAVENOUS | Status: AC
  Administered 2020-03-16: 1000 mL via INTRAVENOUS

## 2020-03-16 MED ORDER — METOCLOPRAMIDE HCL 5 MG/ML IJ SOLN
5.0000 mg | Freq: Once | INTRAMUSCULAR | Status: AC
  Administered 2020-03-16: 5 mg via INTRAVENOUS
  Filled 2020-03-16: qty 2

## 2020-03-16 MED ORDER — OXYBUTYNIN CHLORIDE ER 5 MG PO TB24
10.0000 mg | ORAL_TABLET | Freq: Every day | ORAL | Status: DC
  Administered 2020-03-17 – 2020-03-20 (×4): 10 mg via ORAL
  Filled 2020-03-16 (×4): qty 2

## 2020-03-16 MED ORDER — IOHEXOL 300 MG/ML  SOLN
100.0000 mL | Freq: Once | INTRAMUSCULAR | Status: AC | PRN
  Administered 2020-03-16: 100 mL via INTRAVENOUS

## 2020-03-16 MED ORDER — TEMAZEPAM 15 MG PO CAPS
15.0000 mg | ORAL_CAPSULE | Freq: Every evening | ORAL | Status: DC | PRN
  Administered 2020-03-17 – 2020-03-19 (×3): 15 mg via ORAL
  Filled 2020-03-16 (×3): qty 1

## 2020-03-16 MED ORDER — ONDANSETRON HCL 4 MG/2ML IJ SOLN
4.0000 mg | Freq: Four times a day (QID) | INTRAMUSCULAR | Status: DC | PRN
  Administered 2020-03-16 – 2020-03-19 (×3): 4 mg via INTRAVENOUS
  Filled 2020-03-16 (×3): qty 2

## 2020-03-16 MED ORDER — ONDANSETRON HCL 4 MG PO TABS
4.0000 mg | ORAL_TABLET | Freq: Four times a day (QID) | ORAL | Status: DC | PRN
  Administered 2020-03-19: 4 mg via ORAL
  Filled 2020-03-16: qty 1

## 2020-03-16 MED ORDER — MORPHINE SULFATE (PF) 2 MG/ML IV SOLN
2.0000 mg | INTRAVENOUS | Status: DC | PRN
  Filled 2020-03-16: qty 1

## 2020-03-16 MED ORDER — PANTOPRAZOLE SODIUM 40 MG PO TBEC
40.0000 mg | DELAYED_RELEASE_TABLET | Freq: Two times a day (BID) | ORAL | Status: DC
  Administered 2020-03-17 – 2020-03-20 (×6): 40 mg via ORAL
  Filled 2020-03-16 (×7): qty 1

## 2020-03-16 MED ORDER — SODIUM CHLORIDE 0.9 % IV SOLN: INTRAVENOUS | Status: DC

## 2020-03-16 MED ORDER — GABAPENTIN 300 MG PO CAPS
300.0000 mg | ORAL_CAPSULE | Freq: Two times a day (BID) | ORAL | Status: DC
  Administered 2020-03-16 – 2020-03-20 (×8): 300 mg via ORAL
  Filled 2020-03-16 (×8): qty 1

## 2020-03-16 MED ORDER — PREDNISOLONE ACETATE 1 % OP SUSP
1.0000 [drp] | Freq: Three times a day (TID) | OPHTHALMIC | Status: DC
  Administered 2020-03-16 – 2020-03-20 (×9): 1 [drp] via OPHTHALMIC
  Filled 2020-03-16: qty 1

## 2020-03-16 MED ORDER — VITAMIN D 25 MCG (1000 UNIT) PO TABS
6000.0000 [IU] | ORAL_TABLET | Freq: Every day | ORAL | Status: DC
  Administered 2020-03-17 – 2020-03-20 (×4): 6000 [IU] via ORAL
  Filled 2020-03-16 (×4): qty 6

## 2020-03-16 MED ORDER — TRIAMTERENE-HCTZ 37.5-25 MG PO TABS
1.0000 | ORAL_TABLET | Freq: Every day | ORAL | Status: DC
  Administered 2020-03-17 – 2020-03-20 (×4): 1 via ORAL
  Filled 2020-03-16 (×5): qty 1

## 2020-03-16 MED ORDER — ENOXAPARIN SODIUM 40 MG/0.4ML ~~LOC~~ SOLN
40.0000 mg | SUBCUTANEOUS | Status: DC
  Administered 2020-03-16 – 2020-03-20 (×5): 40 mg via SUBCUTANEOUS
  Filled 2020-03-16 (×5): qty 0.4

## 2020-03-16 MED ORDER — LABETALOL HCL 5 MG/ML IV SOLN
10.0000 mg | INTRAVENOUS | Status: AC | PRN
  Administered 2020-03-16: 10 mg via INTRAVENOUS
  Filled 2020-03-16: qty 4

## 2020-03-16 MED ORDER — CALCIUM CARBONATE-VITAMIN D 500-200 MG-UNIT PO TABS
1.0000 | ORAL_TABLET | Freq: Two times a day (BID) | ORAL | Status: DC
  Administered 2020-03-17 – 2020-03-20 (×6): 1 via ORAL
  Filled 2020-03-16 (×6): qty 1

## 2020-03-16 MED ORDER — DULOXETINE HCL 60 MG PO CPEP
60.0000 mg | ORAL_CAPSULE | Freq: Every day | ORAL | Status: DC
  Administered 2020-03-17 – 2020-03-20 (×3): 60 mg via ORAL
  Filled 2020-03-16 (×5): qty 1

## 2020-03-16 MED ORDER — ONDANSETRON HCL 4 MG/2ML IJ SOLN
INTRAMUSCULAR | Status: AC
  Administered 2020-03-16: 4 mg via INTRAVENOUS
  Filled 2020-03-16: qty 2

## 2020-03-16 NOTE — ED Provider Notes (Signed)
-----------------------------------------   1:00 AM on 03/16/2020 -----------------------------------------  CT abdomen pelvis interpreted per Dr. Andria Meuse:  1. No acute process demonstrated in the abdomen or pelvis. No  evidence of bowel obstruction or inflammation.  2. Diffuse fatty infiltration of the liver.  3. Moderate-sized esophageal hiatal hernia.  4. Diffuse diverticulosis of the colon without evidence of  diverticulitis.  5. 1.6 cm diameter cystic structure in the vulvar region is  unchanged since prior study. Possibly a Bartholin's gland cyst.  Aortic atherosclerosis.   Care assumed from Dr. Darnelle Catalan.  Patient still vomiting, uncomfortable in mild distress.  Right breast with draining abscess approximately quarter in diameter in size.  Will initiate IV fluid resuscitation, broad-spectrum IV antibiotic, soft tissue ultrasound.  Anticipate admission.   Irean Hong, MD 03/16/20 [phone]

## 2020-03-16 NOTE — ED Notes (Signed)
Provider notified of BP of 173/97

## 2020-03-16 NOTE — ED Notes (Signed)
Notified MD regarding antibiotics in case they want to order it for her.

## 2020-03-16 NOTE — ED Notes (Signed)
Pt pad on bed changed due to being wet, purewick repositioned

## 2020-03-16 NOTE — H&P (Signed)
History and Physical    Ann Benson WUJ:[id number] DOB: 1940-11-27 DOA: 03/15/2020  PCP: Lynnea FerrierKlein, Bert J III, MD   Patient coming from: home  I have personally briefly reviewed patient's [facility]  Chief Complaint: Nausea vomiting and diarrhea, lump on right breast  HPI: Ann Platelizabeth W Jilek is a 79 y.o. female with medical history significant for IBS, GERD, HTN, asthma who presented to the emergency room by EMS with a 3-day history of intractable nausea, vomiting, difficulty holding anything down as well as loose stool.  Associated with crampy epigastric pain, moderate intensity nonradiating.  She describes vomitus as dark green and stool was dark but not black.  She denies fever or chills, cough or chest pain she also has concern for a lump that she noticed a few days ago on her right breast. ED Course: Vitals in the ER were within normal limits blood work unremarkable with normal WBC, normal lactic acid.  Urinalysis sterile but with mild ketones of 5.  Lipase normal CMP unremarkable.  Troponin normal.  Stool guaiac negative.  CT abdomen and pelvis showed no acute process in the abdomen and pelvis, diffuse fatty infiltration moderate sized esophageal hiatal hernia, diffuse diverticulosis.  Chest ultrasound showed soft tissue mass right breast with recommendation for diagnostic ultrasound or mammography to exclude neoplasm.  Patient was given a dose of clindamycin.  Hospitalist consulted for admission. Review of Systems: As per HPI otherwise all other systems on review of systems negative.    Past Medical History:  Diagnosis Date  . Arthritis   . Asthma   . Depression   . GERD (gastroesophageal reflux disease)   . Headache   . History of hiatal hernia   . Hyperlipidemia   . Hyperlipidemia   . Hypertension   . IBS (irritable bowel syndrome)   . Neuromuscular disorder (HCC)    neuropathy    Past Surgical History:  Procedure Laterality Date  .  ABDOMINAL HYSTERECTOMY    . APPENDECTOMY    . BREAST BIOPSY Left 07/2013   neg  . CHOLECYSTECTOMY    . ESOPHAGOGASTRODUODENOSCOPY (EGD) WITH PROPOFOL N/A 03/16/2018   Procedure: ESOPHAGOGASTRODUODENOSCOPY (EGD) WITH PROPOFOL;  Surgeon: Toledo, Boykin Nearingeodoro K, MD;  Location: [facility] ENDOSCOPY;  Service: Gastroenterology;  Laterality: N/A;  . EYE SURGERY     cataracts  . KNEE ARTHROSCOPY    . SHOULDER ARTHROSCOPY Left      reports that she has never smoked. She has never used smokeless tobacco. She reports that she does not drink alcohol and does not use drugs.  Allergies  Allergen Reactions  . Aspirin Other (See Comments)    Epitaxis   . Atorvastatin Other (See Comments)  . Mirabegron Itching  . Statins Other (See Comments)  . Acetaminophen Palpitations    Family History  Problem Relation Age of Onset  . Diabetes Mother   . Diabetes Brother   . Hypertension Brother   . Breast cancer Neg Hx       Prior to Admission medications   Medication Sig Start Date End Date Taking? Authorizing Provider  albuterol (PROAIR HFA) 108 (90 Base) MCG/ACT inhaler Inhale into the lungs. 04/11/15   [provider]  amoxicillin (AMOXIL) 500 MG capsule amoxicillin 500 mg capsule    [provider]  Ascorbic Acid (VITAMIN C) 1000 MG tablet Take by mouth.    [provider]  baclofen (LIORESAL) 10 MG tablet Take 5 mg by mouth. 05/18/13   [provider]  Calcium Carbonate-Vitamin D 600-400 MG-UNIT tablet Take by mouth.    [provider]  Cholecalciferol (VITAMIN D3) 2000 units capsule Take by mouth.    [provider]  [facility] BISACODYL 10 MG suppository PLACE 1 SUPPOSITORY (10 MG TOTAL) RECTALLY ONCE DAILY AS NEEDED FOR CONSTIPATION 12/31/17   [provider]  Cyanocobalamin (B-12) 5000 MCG CAPS Take by mouth daily.    [provider]  doxycycline (VIBRA-TABS) 100 MG tablet Take 1 tablet (100 mg total) by mouth 2 (two) times daily. 02/02/18    Hyatt, Max T, DPM  DULoxetine (CYMBALTA) 60 MG capsule TAKE 1 CAPSULE (60 MG TOTAL) BY MOUTH ONCE DAILY. 07/22/15   [provider]  DULoxetine (CYMBALTA) 60 MG capsule duloxetine 60 mg capsule,delayed release    [provider]  fesoterodine (TOVIAZ) 8 MG TB24 tablet Take 1 tablet (8 mg total) by mouth daily. 09/12/18   Alfredo Martinez, MD  fesoterodine (TOVIAZ) 8 MG TB24 tablet Take 1 tablet (8 mg total) by mouth daily. 02/20/19   Alfredo Martinez, MD  furosemide (LASIX) 20 MG tablet furosemide 20 mg tablet    [provider]  gabapentin (NEURONTIN) 300 MG capsule  03/27/17   [provider]  guaifenesin (ROBITUSSIN) 100 MG/5ML syrup Cough Syrup 100 mg/5 mL oral liquid  TAKE 10 MLS (200 MG TOTAL) BY MOUTH EVERY 6 (SIX) HOURS AS NEEDED FOR COUGH OR TO LOOSEN PHLEGM.    [provider]  Karlene Einstein 145 MCG CAPS capsule  03/07/18   [provider]  ofloxacin (OCUFLOX) 0.3 % ophthalmic solution ofloxacin 0.3 % eye drops    [provider]  oxybutynin (DITROPAN-XL) 10 MG 24 hr tablet Take 1 tablet (10 mg total) by mouth daily. 02/22/19   Alfredo Martinez, MD  pantoprazole (PROTONIX) 40 MG tablet Take by mouth. 04/11/15   [provider]  potassium chloride (KLOR-CON M10) 10 MEQ tablet Klor-Con M10 mEq tablet,extended release    [provider]  rosuvastatin (CRESTOR) 5 MG tablet Take 5 mg by mouth once a week. 08/16/19   [provider]  temazepam (RESTORIL) 15 MG capsule TAKE 2 CAPSULES (30 MG TOTAL) BY MOUTH NIGHTLY AS NEEDED FOR SLEEP 10/10/19   [provider]  tolterodine (DETROL LA) 4 MG 24 hr capsule Take 1 capsule (4 mg total) by mouth daily. 12/06/17   Alfredo Martinez, MD  traMADol (ULTRAM) 50 MG tablet tramadol 50 mg tablet    [provider]  triamterene-hydrochlorothiazide (MAXZIDE-25) 37.5-25 MG tablet triamterene 37.5 mg-hydrochlorothiazide 25 mg tablet    [provider]  VITAMIN  D, CHOLECALCIFEROL, PO Take by mouth.    [provider]    Physical Exam: Vitals:   03/15/20 1631 03/15/20 2034 03/15/20 2313 03/16/20 0130  BP: (!) 178/69 (!) 156/91 (!) 162/94 (!) 169/85  Pulse: 60 65 72 78  Resp: 18 18 17 18   Temp:      TempSrc:      SpO2: 98% 98% 99% 97%  Weight:      Height:         Vitals:   03/15/20 1631 03/15/20 2034 03/15/20 2313 03/16/20 0130  BP: (!) 178/69 (!) 156/91 (!) 162/94 (!) 169/85  Pulse: 60 65 72 78  Resp: 18 18 17 18   Temp:      TempSrc:      SpO2: 98% 98% 99% 97%  Weight:      Height:          Constitutional:  Anxious and  ill-appearing HEENT:      Head: Normocephalic and atraumatic.         Eyes: PERLA, EOMI, Conjunctivae are normal. Sclera is non-icteric.       Mouth/Throat: Mucous membranes are moist.       Neck: Supple with no signs of meningismus. Cardiovascular: Regular rate and rhythm. No murmurs, gallops, or rubs. 2+ symmetrical distal pulses are present . No JVD. No LE edema Respiratory: Respiratory effort normal .Lungs sounds clear bilaterally. No wheezes, crackles, or rhonchi.  Gastrointestinal: Soft, mild tenderness in the epigastrium, and non distended with positive bowel sounds. No rebound or guarding. Genitourinary: No CVA tenderness. Musculoskeletal: Nontender with normal range of motion in all extremities. No cyanosis, or erythema of extremities. Neurologic: Normal speech and language. Face is symmetric. Moving all extremities. No gross focal neurologic deficits . Skin: Skin is warm, dry.  No rash or ulcers Psychiatric: Mood and affect are normal Speech and behavior are normal   Labs on Admission: I have personally reviewed following labs and imaging studies  CBC: Recent Labs  Lab 03/15/20 1141  WBC 7.4  HGB 15.3*  HCT 44.8  MCV 87.8  PLT 170   Basic Metabolic Panel: Recent Labs  Lab 03/15/20 1141  NA 142  K 4.0  CL 109  CO2 24  GLUCOSE 109*  BUN 11  CREATININE 0.99  CALCIUM 9.5    GFR: Estimated Creatinine Clearance: 45.5 mL/min (by C-G formula based on SCr of 0.99 mg/dL). Liver Function Tests: Recent Labs  Lab 03/15/20 1141  AST 22  ALT 18  ALKPHOS 51  BILITOT 1.1  PROT 7.3  ALBUMIN 3.7   Recent Labs  Lab 03/15/20 1141  LIPASE 19   No results for input(s): AMMONIA in the last 168 hours. Coagulation Profile: No results for input(s): INR, PROTIME in the last 168 hours. Cardiac Enzymes: No results for input(s): CKTOTAL, CKMB, CKMBINDEX, TROPONINI in the last 168 hours. BNP (last 3 results) No results for input(s): PROBNP in the last 8760 hours. HbA1C: No results for input(s): HGBA1C in the last 72 hours. CBG: No results for input(s): GLUCAP in the last 168 hours. Lipid Profile: No results for input(s): CHOL, HDL, LDLCALC, TRIG, CHOLHDL, LDLDIRECT in the last 72 hours. Thyroid Function Tests: No results for input(s): TSH, T4TOTAL, FREET4, T3FREE, THYROIDAB in the last 72 hours. Anemia Panel: No results for input(s): VITAMINB12, FOLATE, FERRITIN, TIBC, IRON, RETICCTPCT in the last 72 hours. Urine analysis:    Component Value Date/Time   COLORURINE YELLOW (A) 03/15/2020 1141   APPEARANCEUR CLOUDY (A) 03/15/2020 1141   APPEARANCEUR Cloudy (A) 09/12/2018 1432   LABSPEC 1.010 03/15/2020 1141   PHURINE 7.0 03/15/2020 1141   GLUCOSEU NEGATIVE 03/15/2020 1141   HGBUR NEGATIVE 03/15/2020 1141   BILIRUBINUR NEGATIVE 03/15/2020 1141   BILIRUBINUR Negative 09/12/2018 1432   KETONESUR 5 (A) 03/15/2020 1141   PROTEINUR NEGATIVE 03/15/2020 1141   NITRITE NEGATIVE 03/15/2020 1141   LEUKOCYTESUR NEGATIVE 03/15/2020 1141    Radiological Exams on Admission: CT ABDOMEN PELVIS W CONTRAST  Result Date: 03/16/2020 CLINICAL DATA:  Nausea, vomiting, and diarrhea for a couple of days. Abdominal pain EXAM: CT ABDOMEN AND PELVIS WITH CONTRAST TECHNIQUE: Multidetector CT imaging of the abdomen and pelvis was performed using the standard protocol following bolus  administration of intravenous contrast. CONTRAST:  OMNIPAQUE IOHEXOL 300 MG/ML  SOLN COMPARISON:  10/03/2018 FINDINGS: Lower chest: Mild dependent changes in the lung bases. Moderate-sized esophageal hiatal hernia. Hepatobiliary: Diffuse fatty infiltration of the  liver. No focal liver lesions. Surgical absence of the gallbladder. Bile ducts are not dilated. Pancreas: Unremarkable. No pancreatic ductal dilatation or surrounding inflammatory changes. Spleen: Normal in size without focal abnormality. Adrenals/Urinary Tract: Adrenal glands are unremarkable. Kidneys are normal, without renal calculi, focal lesion, or hydronephrosis. Bladder is unremarkable. Stomach/Bowel: Stomach, small bowel, and colon are not abnormally distended. Diffuse diverticulosis of the colon. No inflammatory changes. Appendix is not identified. Vascular/Lymphatic: Aortic atherosclerosis. No enlarged abdominal or pelvic lymph nodes. Reproductive: The uterus is surgically absent. No abnormal adnexal masses. 1.6 cm diameter cystic structure in the vulvar region is unchanged since prior study. Possibly a Bartholin's gland cyst. Other: No abdominal wall hernia or abnormality. No abdominopelvic ascites. Musculoskeletal: Compression deformity post kyphoplasty involving L1. No destructive bone lesions. IMPRESSION: 1. No acute process demonstrated in the abdomen or pelvis. No evidence of bowel obstruction or inflammation. 2. Diffuse fatty infiltration of the liver. 3. Moderate-sized esophageal hiatal hernia. 4. Diffuse diverticulosis of the colon without evidence of diverticulitis. 5. 1.6 cm diameter cystic structure in the vulvar region is unchanged since prior study. Possibly a Bartholin's gland cyst. Aortic atherosclerosis. Aortic Atherosclerosis (ICD10-I70.0). Electronically Signed   By: Burman Nieves M.D.   On: 03/16/2020 00:28   [place] CHEST SOFT TISSUE  Result Date: 03/16/2020 CLINICAL DATA:  Right breast lump.  Concern for abscess. EXAM:  ULTRASOUND OF HEAD/NECK SOFT TISSUES TECHNIQUE: Ultrasound examination of the right breast soft tissues was performed in the area of clinical concern. COMPARISON:  None. FINDINGS: Limited ultrasound images were obtained of the right breast lump. Corresponding to the palpable abnormality, there is a soft tissue mass with internal vascularity measuring 2.9 x 0.7 x 3.3 cm. No loculated collection is identified. IMPRESSION: Soft tissue mass corresponding to palpable abnormality in the right breast. No loculated collection is identified. Recommend referral to breast clinic for diagnostic ultrasound and/or mammography to exclude neoplasm. Electronically Signed   By: Burman Nieves M.D.   On: 03/16/2020 02:08    EKG: Independently reviewed. Interpretation : Sinus rhythm with no acute ST-T wave changes  Assessment/Plan 79 year old female with medical history significant for IBS, GERD, HTN, COPD presenting with a 3-day history of intractable nausea, vomiting, epigastric pain, with additional complaint of right breast lump  Acute gastroenteritis, -Nausea vomiting and diarrhea with no fever or chills.  Mild epigastric pain.  Normal WBC, negative stool guaiac, unremarkable CT abdomen -Supportive care with IV fluids, IV antiemetics, Protonix -Patient does have hiatal hernia as well as IBS so symptoms may be noninfectious -GI panel    Lump of right breast -Suspect noninfectious based on chest ultrasound result -Arrange for diagnostic ultrasound/mammogram as recommended by radiologist    COPD (chronic obstructive pulmonary disease) (HCC) -Continue home inhalers    Essential hypertension -Continue home antihypertensives    IBS (irritable bowel syndrome) -Continue Linzess    DVT prophylaxis: Lovenox  Code Status: full code  Family Communication:  none  Disposition Plan: Back to previous home environment Consults called: none  Status: Observation    Andris Baumann MD [facility]     03/16/2020, 2:43 AM

## 2020-03-16 NOTE — ED Notes (Signed)
Pt given meal tray.

## 2020-03-16 NOTE — Progress Notes (Signed)
PROGRESS NOTE    Ann Platelizabeth W Kaucher  ZOX:[id number] DOB: 10/21/1940 DOA: 03/15/2020 PCP: Lynnea FerrierKlein, Bert J III, MD   Brief Narrative:  Ann Benson is a 79 y.o. female with medical history significant for IBS, GERD, HTN, asthma who presented to the emergency room by EMS with a 3-day history of intractable nausea, vomiting, difficulty holding anything down as well as loose stool.  Associated with crampy epigastric pain, moderate intensity nonradiating.  She describes vomitus as dark green and stool was dark but not black.  She denies fever or chills, cough or chest pain she also has concern for a lump that she noticed a few days ago on her right breast.  ED Course: Vitals in the ER were within normal limits blood work unremarkable with normal WBC, normal lactic acid.  Urinalysis sterile but with mild ketones of 5.  Lipase normal CMP unremarkable.  Troponin normal.  Stool guaiac negative.  CT abdomen and pelvis showed no acute process in the abdomen and pelvis, diffuse fatty infiltration moderate sized esophageal hiatal hernia, diffuse diverticulosis.  Chest ultrasound showed soft tissue mass right breast with recommendation for diagnostic ultrasound or mammography to exclude neoplasm.  Patient was given a dose of clindamycin.  Hospitalist consulted for admission.  Assessment & Plan:   Principal Problem:   Acute gastroenteritis Active Problems:   COPD (chronic obstructive pulmonary disease) (HCC)   PAD (peripheral artery disease) (HCC)   Essential hypertension   IBS (irritable bowel syndrome)   Stage 3 chronic kidney disease   Lump of right breast  Acute gastroenteritis, -Nausea vomiting and diarrhea with no fever or chills.  Mild epigastric pain.  Normal WBC, negative stool guaiac, unremarkable CT abdomen -Supportive care with IV fluids, IV antiemetics, Protonix -Patient does have hiatal hernia as well as IBS so symptoms may be noninfectious -GI panel    Lump of right breast -Suspect  noninfectious based on chest ultrasound result -Arrange for diagnostic ultrasound/mammogram as recommended by radiologist. --Suspect breast cancer. --Clindamycin continued.    COPD (chronic obstructive pulmonary disease) (HCC) -Continue home inhalers as needed.    Essential hypertension -Continue home antihypertensives.    IBS (irritable bowel syndrome) -Continue Linzess    Subjective: Pt seen today and has C/O rt breast pain erythema and tenderness.  Rec's for USG and MMG, will consider onc vs gen surg consult.    Objective: Vitals:   03/16/20 0830 03/16/20 0930 03/16/20 1105 03/16/20 1600  BP: 129/70 (!) 143/67  (!) 127/97  Pulse: 65 64  68  Resp: 12 15 14 16   Temp:    97.8 F (36.6 C)  TempSrc:    Oral  SpO2: 96% 96%  97%  Weight:      Height:        Intake/Output Summary (Last 24 hours) at 03/16/2020 1638 Last data filed at 03/16/2020 0544 Gross per 24 hour  Intake --  Output 900 ml  Net -900 ml   Filed Weights   03/15/20 1135  Weight: 79.8 kg    Examination: Blood pressure (!) 127/97, pulse 68, temperature 97.8 F (36.6 C), temperature source Oral, resp. rate 16, height 5\' 4"  (1.626 m), weight 79.8 kg, SpO2 97 %. General exam: Appears calm and comfortable  Respiratory system: Clear to auscultation. Respiratory effort normal. Cardiovascular system: S1 & S2 heard, RRR. No JVD, murmurs, rubs, gallops or clicks. No pedal edema. Gastrointestinal system: Abdomen is nondistended, soft and nontender. No organomegaly or masses felt. Normal bowel sounds heard. Central nervous system:  Alert and oriented. No focal neurological deficits. Extremities: Symmetric 5 x 5 power. Skin: No rashes, lesions or ulcers Psychiatry: Judgement and insight appear normal. Mood & affect appropriate.   Data Reviewed: I have personally reviewed following labs and imaging studies  CBC: Recent Labs  Lab 03/15/20 1141 03/16/20 0319  WBC 7.4 9.2  HGB 15.3* 15.6*  HCT 44.8 44.7  MCV  87.8 88.0  PLT 170 180   Basic Metabolic Panel: Recent Labs  Lab 03/15/20 1141 03/16/20 0319  NA 142  --   K 4.0  --   CL 109  --   CO2 24  --   GLUCOSE 109*  --   BUN 11  --   CREATININE 0.99 0.92  CALCIUM 9.5  --    GFR: Estimated Creatinine Clearance: 49 mL/min (by C-G formula based on SCr of 0.92 mg/dL). Liver Function Tests: Recent Labs  Lab 03/15/20 1141  AST 22  ALT 18  ALKPHOS 51  BILITOT 1.1  PROT 7.3  ALBUMIN 3.7   Recent Labs  Lab 03/15/20 1141  LIPASE 19   Recent Labs  Lab 03/16/20 0104 03/16/20 0319  LATICACIDVEN 1.5 1.1    Recent Results (from the past 240 hour(s))  Wound or Superficial Culture     Status: None (Preliminary result)   Collection Time: 03/15/20 10:10 PM   Specimen: Abscess; Wound  Result Value Ref Range Status   Specimen Description   Final    ABSCESS Performed at [facility], [address], [place], [place] [address]    Special Requests   Final    NONE Performed at [facility], [address] [place], [place] [address]    Gram Stain   Final    NO WBC SEEN RARE GRAM POSITIVE COCCI IN PAIRS Performed at [facility], [address] [place], [place] [address]    Culture PENDING  Incomplete   Report Status PENDING  Incomplete  SARS Coronavirus 2 by RT PCR (hospital order, performed in [facility] hospital lab) Nasopharyngeal Nasopharyngeal Swab     Status: None   Collection Time: 03/15/20 11:53 PM   Specimen: Nasopharyngeal Swab  Result Value Ref Range Status   SARS Coronavirus 2 NEGATIVE NEGATIVE Final    Comment: (NOTE) SARS-CoV-2 target nucleic acids are NOT DETECTED.  The SARS-CoV-2 RNA is generally detectable in upper and lower respiratory specimens during the acute phase of infection. The lowest concentration of SARS-CoV-2 viral copies this assay can detect is 250 copies / mL. A negative result does not preclude SARS-CoV-2 infection and should not be used as the sole basis for  treatment or other patient management decisions.  A negative result may occur with improper specimen collection / handling, submission of specimen other than nasopharyngeal swab, presence of viral mutation(s) within the areas targeted by this assay, and inadequate number of viral copies (<250 copies / mL). A negative result must be combined with clinical observations, patient history, and epidemiological information.  Fact Sheet for Patients:   BoilerBrush.com.cy  Fact Sheet for Healthcare Providers: https://pope.com/  This test is not yet approved or  cleared by the [place] FDA and has been authorized for detection and/or diagnosis of SARS-CoV-2 by FDA under an Emergency Use Authorization (EUA).  This EUA will remain in effect (meaning this test can be used) for the duration of the COVID-19 declaration under Section 564(b)(1) of the Act, 21 U.S.C. section 360bbb-3(b)(1), unless the authorization is terminated or revoked sooner.  Performed  at [facility], [address] [place], [place] [address]   Culture, blood (routine x 2)     Status: None (Preliminary result)   Collection Time: 03/16/20  1:04 AM   Specimen: BLOOD  Result Value Ref Range Status   Specimen Description BLOOD LEFT ANTECUBITAL  Final   Special Requests   Final    BOTTLES DRAWN AEROBIC AND ANAEROBIC Blood Culture adequate volume   Culture   Final    NO GROWTH < 12 HOURS Performed at [facility], [address] [place], [place] [address]    Report Status PENDING  Incomplete  Culture, blood (routine x 2)     Status: None (Preliminary result)   Collection Time: 03/16/20  1:04 AM   Specimen: BLOOD  Result Value Ref Range Status   Specimen Description BLOOD RIGHT ANTECUBITAL  Final   Special Requests   Final    BOTTLES DRAWN AEROBIC AND ANAEROBIC Blood Culture results may not be optimal due to an inadequate volume of blood received in  culture bottles   Culture   Final    NO GROWTH < 12 HOURS Performed at [facility], [address] [place], [place] [address]    Report Status PENDING  Incomplete     Radiology Studies: CT ABDOMEN PELVIS W CONTRAST  Result Date: 03/16/2020 CLINICAL DATA:  Nausea, vomiting, and diarrhea for a couple of days. Abdominal pain EXAM: CT ABDOMEN AND PELVIS WITH CONTRAST TECHNIQUE: Multidetector CT imaging of the abdomen and pelvis was performed using the standard protocol following bolus administration of intravenous contrast. CONTRAST:  OMNIPAQUE IOHEXOL 300 MG/ML  SOLN COMPARISON:  10/03/2018 FINDINGS: Lower chest: Mild dependent changes in the lung bases. Moderate-sized esophageal hiatal hernia. Hepatobiliary: Diffuse fatty infiltration of the liver. No focal liver lesions. Surgical absence of the gallbladder. Bile ducts are not dilated. Pancreas: Unremarkable. No pancreatic ductal dilatation or surrounding inflammatory changes. Spleen: Normal in size without focal abnormality. Adrenals/Urinary Tract: Adrenal glands are unremarkable. Kidneys are normal, without renal calculi, focal lesion, or hydronephrosis. Bladder is unremarkable. Stomach/Bowel: Stomach, small bowel, and colon are not abnormally distended. Diffuse diverticulosis of the colon. No inflammatory changes. Appendix is not identified. Vascular/Lymphatic: Aortic atherosclerosis. No enlarged abdominal or pelvic lymph nodes. Reproductive: The uterus is surgically absent. No abnormal adnexal masses. 1.6 cm diameter cystic structure in the vulvar region is unchanged since prior study. Possibly a Bartholin's gland cyst. Other: No abdominal wall hernia or abnormality. No abdominopelvic ascites. Musculoskeletal: Compression deformity post kyphoplasty involving L1. No destructive bone lesions. IMPRESSION: 1. No acute process demonstrated in the abdomen or pelvis. No evidence of bowel obstruction or inflammation. 2. Diffuse fatty  infiltration of the liver. 3. Moderate-sized esophageal hiatal hernia. 4. Diffuse diverticulosis of the colon without evidence of diverticulitis. 5. 1.6 cm diameter cystic structure in the vulvar region is unchanged since prior study. Possibly a Bartholin's gland cyst. Aortic atherosclerosis. Aortic Atherosclerosis (ICD10-I70.0). Electronically Signed   By: Burman Nieves M.D.   On: 03/16/2020 00:28   [place] CHEST SOFT TISSUE  Result Date: 03/16/2020 CLINICAL DATA:  Right breast lump.  Concern for abscess. EXAM: ULTRASOUND OF HEAD/NECK SOFT TISSUES TECHNIQUE: Ultrasound examination of the right breast soft tissues was performed in the area of clinical concern. COMPARISON:  None. FINDINGS: Limited ultrasound images were obtained of the right breast lump. Corresponding to the palpable abnormality, there is a soft tissue mass with internal vascularity measuring 2.9 x 0.7 x 3.3 cm. No loculated collection is identified.  IMPRESSION: Soft tissue mass corresponding to palpable abnormality in the right breast. No loculated collection is identified. Recommend referral to breast clinic for diagnostic ultrasound and/or mammography to exclude neoplasm. Electronically Signed   By: Burman Nieves M.D.   On: 03/16/2020 02:08   Scheduled Meds: . enoxaparin (LOVENOX) injection  40 mg Subcutaneous Q24H   Continuous Infusions: . sodium chloride 100 mL/hr at 03/16/20 1559  . clindamycin (CLEOCIN) IV      Gertha Calkin, MD [facility] Pager [phone] If 7PM-7AM, please contact night-coverage [email or web address] Password [facility] 03/16/2020, 4:38 PM

## 2020-03-16 NOTE — ED Notes (Signed)
Admitted MD at bedside. 

## 2020-03-17 DIAGNOSIS — F329 Major depressive disorder, single episode, unspecified: Secondary | ICD-10-CM | POA: Diagnosis present

## 2020-03-17 DIAGNOSIS — E1142 Type 2 diabetes mellitus with diabetic polyneuropathy: Secondary | ICD-10-CM | POA: Diagnosis present

## 2020-03-17 DIAGNOSIS — Z886 Allergy status to analgesic agent status: Secondary | ICD-10-CM | POA: Diagnosis not present

## 2020-03-17 DIAGNOSIS — K449 Diaphragmatic hernia without obstruction or gangrene: Secondary | ICD-10-CM | POA: Diagnosis present

## 2020-03-17 DIAGNOSIS — L0390 Cellulitis, unspecified: Secondary | ICD-10-CM | POA: Diagnosis present

## 2020-03-17 DIAGNOSIS — R112 Nausea with vomiting, unspecified: Secondary | ICD-10-CM | POA: Diagnosis present

## 2020-03-17 DIAGNOSIS — N611 Abscess of the breast and nipple: Secondary | ICD-10-CM

## 2020-03-17 DIAGNOSIS — M1990 Unspecified osteoarthritis, unspecified site: Secondary | ICD-10-CM | POA: Diagnosis present

## 2020-03-17 DIAGNOSIS — E1151 Type 2 diabetes mellitus with diabetic peripheral angiopathy without gangrene: Secondary | ICD-10-CM | POA: Diagnosis present

## 2020-03-17 DIAGNOSIS — Z20822 Contact with and (suspected) exposure to covid-19: Secondary | ICD-10-CM | POA: Diagnosis present

## 2020-03-17 DIAGNOSIS — Z79899 Other long term (current) drug therapy: Secondary | ICD-10-CM | POA: Diagnosis not present

## 2020-03-17 DIAGNOSIS — Z79891 Long term (current) use of opiate analgesic: Secondary | ICD-10-CM | POA: Diagnosis not present

## 2020-03-17 DIAGNOSIS — Z8249 Family history of ischemic heart disease and other diseases of the circulatory system: Secondary | ICD-10-CM | POA: Diagnosis not present

## 2020-03-17 DIAGNOSIS — E785 Hyperlipidemia, unspecified: Secondary | ICD-10-CM | POA: Diagnosis present

## 2020-03-17 DIAGNOSIS — G43909 Migraine, unspecified, not intractable, without status migrainosus: Secondary | ICD-10-CM | POA: Diagnosis present

## 2020-03-17 DIAGNOSIS — I96 Gangrene, not elsewhere classified: Secondary | ICD-10-CM | POA: Diagnosis present

## 2020-03-17 DIAGNOSIS — J449 Chronic obstructive pulmonary disease, unspecified: Secondary | ICD-10-CM | POA: Diagnosis present

## 2020-03-17 DIAGNOSIS — K760 Fatty (change of) liver, not elsewhere classified: Secondary | ICD-10-CM | POA: Diagnosis present

## 2020-03-17 DIAGNOSIS — K219 Gastro-esophageal reflux disease without esophagitis: Secondary | ICD-10-CM | POA: Diagnosis present

## 2020-03-17 DIAGNOSIS — E1122 Type 2 diabetes mellitus with diabetic chronic kidney disease: Secondary | ICD-10-CM | POA: Diagnosis present

## 2020-03-17 DIAGNOSIS — B9562 Methicillin resistant Staphylococcus aureus infection as the cause of diseases classified elsewhere: Secondary | ICD-10-CM | POA: Diagnosis present

## 2020-03-17 DIAGNOSIS — Z833 Family history of diabetes mellitus: Secondary | ICD-10-CM | POA: Diagnosis not present

## 2020-03-17 DIAGNOSIS — Z888 Allergy status to other drugs, medicaments and biological substances status: Secondary | ICD-10-CM | POA: Diagnosis not present

## 2020-03-17 DIAGNOSIS — L988 Other specified disorders of the skin and subcutaneous tissue: Secondary | ICD-10-CM | POA: Diagnosis not present

## 2020-03-17 DIAGNOSIS — K529 Noninfective gastroenteritis and colitis, unspecified: Secondary | ICD-10-CM | POA: Diagnosis present

## 2020-03-17 DIAGNOSIS — R195 Other fecal abnormalities: Secondary | ICD-10-CM | POA: Diagnosis present

## 2020-03-17 LAB — COMPREHENSIVE METABOLIC PANEL
ALT: 17 U/L (ref 0–44)
AST: 23 U/L (ref 15–41)
Albumin: 3.5 g/dL (ref 3.5–5.0)
Alkaline Phosphatase: 45 U/L (ref 38–126)
Anion gap: 8 (ref 5–15)
BUN: 8 mg/dL (ref 8–23)
CO2: 23 mmol/L (ref 22–32)
Calcium: 8.8 mg/dL — ABNORMAL LOW (ref 8.9–10.3)
Chloride: 108 mmol/L (ref 98–111)
Creatinine, Ser: 0.86 mg/dL (ref 0.44–1.00)
GFR calc Af Amer: >60 mL/min (ref >60)
GFR calc non Af Amer: >60 mL/min (ref >60)
Glucose, Bld: 93 mg/dL (ref 70–99)
Potassium: 3.8 mmol/L (ref 3.5–5.1)
Sodium: 139 mmol/L (ref 135–145)
Total Bilirubin: 1.1 mg/dL (ref 0.3–1.2)
Total Protein: 6.7 g/dL (ref 6.5–8.1)

## 2020-03-17 LAB — MAGNESIUM: Magnesium: 2.1 mg/dL (ref 1.7–2.4)

## 2020-03-17 LAB — LIPASE, BLOOD: Lipase: 24 U/L (ref 11–51)

## 2020-03-17 MED ORDER — LABETALOL HCL 5 MG/ML IV SOLN
10.0000 mg | INTRAVENOUS | Status: AC | PRN
  Administered 2020-03-17: 10 mg via INTRAVENOUS
  Filled 2020-03-17: qty 4

## 2020-03-17 MED ORDER — CEPHALEXIN 500 MG PO CAPS: 500.0000 mg | ORAL_CAPSULE | Freq: Three times a day (TID) | ORAL | Status: DC

## 2020-03-17 MED ORDER — HYDRALAZINE HCL 20 MG/ML IJ SOLN
10.0000 mg | Freq: Three times a day (TID) | INTRAMUSCULAR | Status: DC | PRN
  Administered 2020-03-17: 10 mg via INTRAVENOUS
  Filled 2020-03-17: qty 1

## 2020-03-17 MED ORDER — CLINDAMYCIN PHOSPHATE 600 MG/50ML IV SOLN
600.0000 mg | Freq: Three times a day (TID) | INTRAVENOUS | Status: DC
  Administered 2020-03-17: 600 mg via INTRAVENOUS
  Filled 2020-03-17 (×2): qty 50

## 2020-03-17 MED ORDER — CLINDAMYCIN PHOSPHATE 600 MG/50ML IV SOLN
600.0000 mg | Freq: Three times a day (TID) | INTRAVENOUS | Status: DC
  Administered 2020-03-17 – 2020-03-20 (×7): 600 mg via INTRAVENOUS
  Filled 2020-03-17 (×12): qty 50

## 2020-03-17 NOTE — Care Management Obs Status (Signed)
MEDICARE OBSERVATION STATUS NOTIFICATION   Patient Details  Name: KASIAH MANKA MRN: [id number] Date of Birth: 04-13-1941   Medicare Observation Status Notification Given:  Other (see comment) (Verbally agreed, notified by phone)    Maud Deed, LCSW 03/17/2020, 3:37 PM

## 2020-03-17 NOTE — Consult Note (Signed)
Date of Consultation:  03/17/2020  Requesting Physician:  Irena Cords, MD  Reason for Consultation:  Right breast abscess  History of Present Illness: Ann Benson is a 79 y.o. female presenting with a right breast abscess.  She reports that she first noticed it about 8 days ago while at home.  This started to grow and become tender and had purulent drainage.  She presented to her PCP's office and was started on Bactrim with recommendation to also do warm compresses to help with drainage.  Culture swab was done as well, which resulted in MRSA.  Unfortunately, her abscess has not improved.  She also started having nausea, vomiting, and abdominal cramping with diarrhea after starting Bactrim and she stopped taking it.  However, her GI symptoms persisted and she presented to the ED for evaluation.  CT scan of her abdomen/pelvis was done without any acute findings.  U/S of the chest was done showing a possible right breast mass measuring about 2.9 x 0.7 x 3.3 cm.  She was started on keflex here and is currently on a clear liquid diet for possible gastroenteritis.  Past Medical History: Past Medical History:  Diagnosis Date  . Arthritis   . Asthma   . Depression   . GERD (gastroesophageal reflux disease)   . Headache   . History of hiatal hernia   . Hyperlipidemia   . Hyperlipidemia   . Hypertension   . IBS (irritable bowel syndrome)   . Neuromuscular disorder (HCC)    neuropathy     Past Surgical History: Past Surgical History:  Procedure Laterality Date  . ABDOMINAL HYSTERECTOMY    . APPENDECTOMY    . BREAST BIOPSY Left 07/2013   neg  . CHOLECYSTECTOMY    . ESOPHAGOGASTRODUODENOSCOPY (EGD) WITH PROPOFOL N/A 03/16/2018   Procedure: ESOPHAGOGASTRODUODENOSCOPY (EGD) WITH PROPOFOL;  Surgeon: Toledo, Boykin Nearing, MD;  Location: [facility] ENDOSCOPY;  Service: Gastroenterology;  Laterality: N/A;  . EYE SURGERY     cataracts  . KNEE ARTHROSCOPY    . SHOULDER ARTHROSCOPY Left     Home  Medications: Prior to Admission medications   Medication Sig Start Date End Date Taking? Authorizing Provider  Calcium Carbonate-Vitamin D 600-400 MG-UNIT tablet Take 1 tablet by mouth 2 (two) times daily with a meal.    Yes [provider]  Cholecalciferol (VITAMIN D3) 2000 units capsule Take 6,000 Units by mouth daily.    Yes [provider]  Cyanocobalamin (B-12) 1000 MCG CAPS Take by mouth daily.    Yes [provider]  DULoxetine (CYMBALTA) 60 MG capsule duloxetine 60 mg capsule,delayed release   Yes [provider]  gabapentin (NEURONTIN) 300 MG capsule 300 mg 2 (two) times daily. Take 1 capsule (300 mg) in the morning and 2 capsules (600 mg) in the evening. 03/27/17  Yes [provider]  oxybutynin (DITROPAN-XL) 10 MG 24 hr tablet Take 1 tablet (10 mg total) by mouth daily. 02/22/19  Yes MacDiarmid, Lorin Picket, MD  pantoprazole (PROTONIX) 40 MG tablet Take 40 mg by mouth 2 (two) times daily before a meal.  04/11/15  Yes [provider]  prednisoLONE acetate (PRED FORTE) 1 % ophthalmic suspension Place 1 drop into the left eye 3 (three) times daily. 02/27/20  Yes [provider]  rosuvastatin (CRESTOR) 5 MG tablet Take 5 mg by mouth once a week. 08/16/19  Yes [provider]  sulfamethoxazole-trimethoprim (BACTRIM DS) 800-160 MG tablet Take 1 tablet by mouth 2 (two) times daily. 03/11/20 03/21/20 Yes [provider]  temazepam (RESTORIL) 15 MG capsule 30 mg at bedtime as needed for sleep.  10/10/19  Yes [provider]  triamterene-hydrochlorothiazide (MAXZIDE-25) 37.5-25 MG tablet Take 1 tablet by mouth daily.    Yes [provider]    Allergies: Allergies  Allergen Reactions  . Aspirin Other (See Comments)    Epitaxis   . Atorvastatin Other (See Comments)  . Mirabegron Itching  . Statins Other (See Comments)  . Acetaminophen Palpitations    Social History:  reports that she has never smoked. She has  never used smokeless tobacco. She reports that she does not drink alcohol and does not use drugs.   Family History: Family History  Problem Relation Age of Onset  . Diabetes Mother   . Diabetes Brother   . Hypertension Brother   . Breast cancer Neg Hx     Review of Systems: Review of Systems  Constitutional: Negative for chills and fever.  HENT: Negative for hearing loss.   Respiratory: Negative for shortness of breath.   Cardiovascular: Negative for chest pain.  Gastrointestinal: Positive for abdominal pain, diarrhea, nausea and vomiting. Negative for constipation.  Genitourinary: Negative for dysuria.  Musculoskeletal: Negative for myalgias.  Skin:       Right breast abscess with spontaneous drainage  Neurological: Negative for dizziness.  Psychiatric/Behavioral: Negative for depression.    Physical Exam BP (!) 146/76 Comment: after PRN was given  Pulse 81   Temp 97.8 F (36.6 C) (Oral)   Resp 16   Ht 5\' 4"  (1.626 m)   Wt 79.8 kg   SpO2 99%   BMI 30.21 kg/m  CONSTITUTIONAL: No acute distress HEENT:  Normocephalic, atraumatic, extraocular motion intact. NECK: Trachea is midline, and there is no jugular venous distension. RESPIRATORY:  Lungs are clear, and breath sounds are equal bilaterally. Normal respiratory effort without pathologic use of accessory muscles. CARDIOVASCULAR: Heart is regular without murmurs, gallops, or rubs. BREAST:  Right breast has a 4 cm area of erythema and induration in the lower outer quadrant, about 7 cm from nipple.  It is very tender to palpation.  There is no purulent drainage at this point, but it clearly has an area of thinning where it has drained from before. GI: The abdomen is soft, non-distended, non-tender.  MUSCULOSKELETAL:  Normal muscle strength and tone in all four extremities.  No peripheral edema or cyanosis. SKIN: Skin turgor is normal. There are no pathologic skin lesions.  NEUROLOGIC:  Motor and sensation is grossly normal.   Cranial nerves are grossly intact. PSYCH:  Alert and oriented to person, place and time. Affect is normal.  Laboratory Analysis: Results for orders placed or performed during the hospital encounter of 03/15/20 (from the past 24 hour(s))  Comprehensive metabolic panel     Status: Abnormal   Collection Time: 03/17/20  8:59 AM  Result Value Ref Range   Sodium 139 135 - 145 mmol/L   Potassium 3.8 3.5 - 5.1 mmol/L   Chloride 108 98 - 111 mmol/L   CO2 23 22 - 32 mmol/L   Glucose, Bld 93 70 - 99 mg/dL   BUN 8 8 - 23 mg/dL   Creatinine, Ser 05/17/20 0.44 - 1.00 mg/dL   Calcium 8.8 (L) 8.9 - 10.3 mg/dL   Total Protein 6.7 6.5 - 8.1 g/dL   Albumin 3.5 3.5 - 5.0 g/dL   AST 23 15 - 41 U/L   ALT 17 0 - 44 U/L   Alkaline Phosphatase 45 38 -  126 U/L   Total Bilirubin 1.1 0.3 - 1.2 mg/dL   GFR calc non Af Amer >60 >60 mL/min   GFR calc Af Amer >60 >60 mL/min   Anion gap 8 5 - 15  Magnesium     Status: None   Collection Time: 03/17/20  8:59 AM  Result Value Ref Range   Magnesium 2.1 1.7 - 2.4 mg/dL  Lipase, blood     Status: None   Collection Time: 03/17/20  8:59 AM  Result Value Ref Range   Lipase 24 11 - 51 U/L    Imaging: CT abd/pelvis 03/16/20: IMPRESSION: 1. No acute process demonstrated in the abdomen or pelvis. No evidence of bowel obstruction or inflammation. 2. Diffuse fatty infiltration of the liver. 3. Moderate-sized esophageal hiatal hernia. 4. Diffuse diverticulosis of the colon without evidence of diverticulitis. 5. 1.6 cm diameter cystic structure in the vulvar region is unchanged since prior study. Possibly a Bartholin's gland cyst. Aortic atherosclerosis.  U/S chest 03/16/20: FINDINGS: Limited ultrasound images were obtained of the right breast lump. Corresponding to the palpable abnormality, there is a soft tissue mass with internal vascularity measuring 2.9 x 0.7 x 3.3 cm. No loculated collection is identified.  IMPRESSION: Soft tissue mass corresponding to palpable  abnormality in the right breast. No loculated collection is identified.   Assessment and Plan: This is a 79 y.o. female with right breast abscess  --Discussed with the patient that this abscess though has been draining some purulent fluid, still needs a formal I&D procedure to better drain this and be able to treat the infection better.  Discussed with her the procedure at length, but the patient is worried about the pain.  She is very tender to palpation which made examination more difficult.  Given the pain, I discussed with her that rather than attempt this at bedside with only local anesthesia, we can proceed to do this in the OR under monitored anesthesia.  We would still use local anesthetic and be able to do a thorough I&D.  Discussed that I would likely leave a wick of gauze to be changed daily until the wound heals.  The patient had just finished lunch at the time of my evaluation, so we will add her to the schedule for tomorrow.  Will make her NPO after midnight. --Based on cultures from her PCP's office, the abscess is growing MRSA.  Given her GI issues with Bactrim, I will change her antibiotic from Keflex which does not cover MRSA, to Clindamycin which it is sensitive to based on cultures. --Ok to do warm compresses to continue promoting drainage.   Face-to-face time spent with the patient and care providers was 80 minutes, with more than 50% of the time spent counseling, educating, and coordinating care of the patient.     Howie Ill, MD [facility] Pg:  [phone]

## 2020-03-17 NOTE — Progress Notes (Signed)
PROGRESS NOTE    Ann Benson  ZOX:[id number] DOB: 11-01-1940 DOA: 03/15/2020 PCP: Lynnea FerrierKlein, Bert J III, MD   Brief Narrative:  Ann Benson is a 79 y.o. female with medical history significant for IBS, GERD, HTN, asthma who presented to the emergency room by EMS with a 3-day history of intractable nausea, vomiting, difficulty holding anything down as well as loose stool.  Associated with crampy epigastric pain, moderate intensity nonradiating.  She describes vomitus as dark green and stool was dark but not black.  She denies fever or chills, cough or chest pain she also has concern for a lump that she noticed a few days ago on her right breast.  ED Course: Vitals in the ER were within normal limits blood work unremarkable with normal WBC, normal lactic acid.  Urinalysis sterile but with mild ketones of 5.  Lipase normal CMP unremarkable.  Troponin normal.  Stool guaiac negative.  CT abdomen and pelvis showed no acute process in the abdomen and pelvis, diffuse fatty infiltration moderate sized esophageal hiatal hernia, diffuse diverticulosis.  Chest ultrasound showed soft tissue mass right breast with recommendation for diagnostic ultrasound or mammography to exclude neoplasm.  Patient was given a dose of clindamycin.  Hospitalist consulted for admission.  Assessment & Plan:   Principal Problem:   Acute gastroenteritis Active Problems:   COPD (chronic obstructive pulmonary disease) (HCC)   PAD (peripheral artery disease) (HCC)   Essential hypertension   IBS (irritable bowel syndrome)   Stage 3 chronic kidney disease   Lump of right breast  Acute gastroenteritis, -Nausea vomiting and diarrhea with no fever or chills.  Mild epigastric pain.  Normal WBC, negative stool guaiac, unremarkable CT abdomen -Supportive care with IV fluids, IV antiemetics, Protonix -Patient does have hiatal hernia as well as IBS so symptoms may be noninfectious -Attribute to Recent Bactrim for infection.-Diet  as tolerated.     Lump of right breast -Suspect noninfectious based on chest ultrasound result -Arrange for diagnostic ultrasound/mammogram as recommended by radiologist. --Suspect breast cancer. --Clindamycin continued. - Gen surg consulted.     COPD (chronic obstructive pulmonary disease) (HCC) -Continue home inhalers as needed.    Essential hypertension -Continue home antihypertensives.    IBS (irritable bowel syndrome) -Hold Linzess    Subjective: Pt seen today and has C/O rt breast pain erythema and tenderness.  Rec's for USG and MMG, will consider onc vs gen surg consult.   Pt seen today she is doing well is started on clear liquid diet.  Gen surg consulted and per outpatient cx clindamycin restarted.  Pt is sch for I/D tomorrow.   Objective: Vitals:   03/17/20 0432 03/17/20 0614 03/17/20 1154 03/17/20 1317  BP: (!) 165/78 (!) 166/85 (!) 176/79 (!) 146/76  Pulse: 64 67 64 81  Resp: 16     Temp: 97.8 F (36.6 C)     TempSrc: Oral     SpO2: 100% 97% 99%   Weight:      Height:        Intake/Output Summary (Last 24 hours) at 03/17/2020 1413 Last data filed at 03/17/2020 1325 Gross per 24 hour  Intake 1001.6 ml  Output 1600 ml  Net -598.4 ml   Filed Weights   03/15/20 1135  Weight: 79.8 kg    Examination: Blood pressure (!) 146/76, pulse 81, temperature 97.8 F (36.6 C), temperature source Oral, resp. rate 16, height 5\' 4"  (1.626 m), weight 79.8 kg, SpO2 99 %. General exam: Appears calm and comfortable  Respiratory system: Clear to auscultation. Respiratory effort normal. Cardiovascular system: S1 & S2 heard, RRR. No JVD, murmurs, rubs, gallops or clicks. No pedal edema. Gastrointestinal system: Abdomen is nondistended, soft and nontender. No organomegaly or masses felt. Normal bowel sounds heard. Central nervous system: Alert and oriented. No focal neurological deficits. Extremities: Symmetric 5 x 5 power. Skin: No rashes, lesions or  ulcers Psychiatry: Judgement and insight appear normal. Mood & affect appropriate.   Data Reviewed: I have personally reviewed following labs and imaging studies  CBC: Recent Labs  Lab 03/15/20 1141 03/16/20 0319  WBC 7.4 9.2  HGB 15.3* 15.6*  HCT 44.8 44.7  MCV 87.8 88.0  PLT 170 180   Basic Metabolic Panel: Recent Labs  Lab 03/15/20 1141 03/16/20 0319 03/17/20 0859  NA 142  --  139  K 4.0  --  3.8  CL 109  --  108  CO2 24  --  23  GLUCOSE 109*  --  93  BUN 11  --  8  CREATININE 0.99 0.92 0.86  CALCIUM 9.5  --  8.8*  MG  --   --  2.1   GFR: Estimated Creatinine Clearance: 52.4 mL/min (by C-G formula based on SCr of 0.86 mg/dL). Liver Function Tests: Recent Labs  Lab 03/15/20 1141 03/17/20 0859  AST 22 23  ALT 18 17  ALKPHOS 51 45  BILITOT 1.1 1.1  PROT 7.3 6.7  ALBUMIN 3.7 3.5   Recent Labs  Lab 03/15/20 1141 03/17/20 0859  LIPASE 19 24   Recent Labs  Lab 03/16/20 0104 03/16/20 0319  LATICACIDVEN 1.5 1.1    Recent Results (from the past 240 hour(s))  Wound or Superficial Culture     Status: None (Preliminary result)   Collection Time: 03/15/20 10:10 PM   Specimen: Abscess; Wound  Result Value Ref Range Status   Specimen Description   Final    ABSCESS Performed at [facility], [address] [place], [place] [address]    Special Requests   Final    NONE Performed at [facility], [address] [place], [place] [address]    Gram Stain NO WBC SEEN RARE GRAM POSITIVE COCCI IN PAIRS   Final   Culture   Final    ABUNDANT STAPHYLOCOCCUS AUREUS SUSCEPTIBILITIES TO FOLLOW Performed at [facility], [address] [place], [place] [address]    Report Status PENDING  Incomplete  SARS Coronavirus 2 by RT PCR (hospital order, performed in [facility] hospital lab) Nasopharyngeal Nasopharyngeal Swab     Status: None   Collection Time: 03/15/20 11:53 PM   Specimen: Nasopharyngeal Swab  Result Value Ref Range Status    SARS Coronavirus 2 NEGATIVE NEGATIVE Final    Comment: (NOTE) SARS-CoV-2 target nucleic acids are NOT DETECTED.  The SARS-CoV-2 RNA is generally detectable in upper and lower respiratory specimens during the acute phase of infection. The lowest concentration of SARS-CoV-2 viral copies this assay can detect is 250 copies / mL. A negative result does not preclude SARS-CoV-2 infection and should not be used as the sole basis for treatment or other patient management decisions.  A negative result may occur with improper specimen collection / handling, submission of specimen other than nasopharyngeal swab, presence of viral mutation(s) within the areas targeted by this assay, and inadequate number of viral copies (<250 copies / mL). A negative result must be combined with clinical observations, patient history, and epidemiological information.  Fact Sheet for Patients:  BoilerBrush.com.cy  Fact Sheet for Healthcare Providers: https://pope.com/  This test is not yet approved or  cleared by the [place] FDA and has been authorized for detection and/or diagnosis of SARS-CoV-2 by FDA under an Emergency Use Authorization (EUA).  This EUA will remain in effect (meaning this test can be used) for the duration of the COVID-19 declaration under Section 564(b)(1) of the Act, 21 U.S.C. section 360bbb-3(b)(1), unless the authorization is terminated or revoked sooner.  Performed at [facility], [address] [place], [place] [address]   Culture, blood (routine x 2)     Status: None (Preliminary result)   Collection Time: 03/16/20  1:04 AM   Specimen: BLOOD  Result Value Ref Range Status   Specimen Description BLOOD LEFT ANTECUBITAL  Final   Special Requests   Final    BOTTLES DRAWN AEROBIC AND ANAEROBIC Blood Culture adequate volume   Culture   Final    NO GROWTH 1 DAY Performed at [facility], [address] [place], [place] [address]    Report Status PENDING  Incomplete  Culture, blood (routine x 2)     Status: None (Preliminary result)   Collection Time: 03/16/20  1:04 AM   Specimen: BLOOD  Result Value Ref Range Status   Specimen Description BLOOD RIGHT ANTECUBITAL  Final   Special Requests   Final    BOTTLES DRAWN AEROBIC AND ANAEROBIC Blood Culture results may not be optimal due to an inadequate volume of blood received in culture bottles   Culture   Final    NO GROWTH 1 DAY Performed at [facility], [address] [place], [place] [address]    Report Status PENDING  Incomplete     Radiology Studies: CT ABDOMEN PELVIS W CONTRAST  Result Date: 03/16/2020 CLINICAL DATA:  Nausea, vomiting, and diarrhea for a couple of days. Abdominal pain EXAM: CT ABDOMEN AND PELVIS WITH CONTRAST TECHNIQUE: Multidetector CT imaging of the abdomen and pelvis was performed using the standard protocol following bolus administration of intravenous contrast. CONTRAST:  OMNIPAQUE IOHEXOL 300 MG/ML  SOLN COMPARISON:  10/03/2018 FINDINGS: Lower chest: Mild dependent changes in the lung bases. Moderate-sized esophageal hiatal hernia. Hepatobiliary: Diffuse fatty infiltration of the liver. No focal liver lesions. Surgical absence of the gallbladder. Bile ducts are not dilated. Pancreas: Unremarkable. No pancreatic ductal dilatation or surrounding inflammatory changes. Spleen: Normal in size without focal abnormality. Adrenals/Urinary Tract: Adrenal glands are unremarkable. Kidneys are normal, without renal calculi, focal lesion, or hydronephrosis. Bladder is unremarkable. Stomach/Bowel: Stomach, small bowel, and colon are not abnormally distended. Diffuse diverticulosis of the colon. No inflammatory changes. Appendix is not identified. Vascular/Lymphatic: Aortic atherosclerosis. No enlarged abdominal or pelvic lymph nodes. Reproductive: The uterus is surgically absent. No abnormal adnexal masses. 1.6 cm  diameter cystic structure in the vulvar region is unchanged since prior study. Possibly a Bartholin's gland cyst. Other: No abdominal wall hernia or abnormality. No abdominopelvic ascites. Musculoskeletal: Compression deformity post kyphoplasty involving L1. No destructive bone lesions. IMPRESSION: 1. No acute process demonstrated in the abdomen or pelvis. No evidence of bowel obstruction or inflammation. 2. Diffuse fatty infiltration of the liver. 3. Moderate-sized esophageal hiatal hernia. 4. Diffuse diverticulosis of the colon without evidence of diverticulitis. 5. 1.6 cm diameter cystic structure in the vulvar region is unchanged since prior study. Possibly a Bartholin's gland cyst. Aortic atherosclerosis. Aortic Atherosclerosis (ICD10-I70.0). Electronically Signed   By: Burman Nieves M.D.   On: 03/16/2020 00:28   [place] CHEST  SOFT TISSUE  Result Date: 03/16/2020 CLINICAL DATA:  Right breast lump.  Concern for abscess. EXAM: ULTRASOUND OF HEAD/NECK SOFT TISSUES TECHNIQUE: Ultrasound examination of the right breast soft tissues was performed in the area of clinical concern. COMPARISON:  None. FINDINGS: Limited ultrasound images were obtained of the right breast lump. Corresponding to the palpable abnormality, there is a soft tissue mass with internal vascularity measuring 2.9 x 0.7 x 3.3 cm. No loculated collection is identified. IMPRESSION: Soft tissue mass corresponding to palpable abnormality in the right breast. No loculated collection is identified. Recommend referral to breast clinic for diagnostic ultrasound and/or mammography to exclude neoplasm. Electronically Signed   By: Burman Nieves M.D.   On: 03/16/2020 02:08   Scheduled Meds: . calcium-vitamin D  1 tablet Oral BID WC  . cholecalciferol  6,000 Units Oral Daily  . DULoxetine  60 mg Oral Daily  . enoxaparin (LOVENOX) injection  40 mg Subcutaneous Q24H  . gabapentin  300 mg Oral BID  . oxybutynin  10 mg Oral Daily  . pantoprazole  40 mg  Oral BID AC  . prednisoLONE acetate  1 drop Left Eye TID  . rosuvastatin  5 mg Oral Weekly  . triamterene-hydrochlorothiazide  1 tablet Oral Daily   Continuous Infusions: . sodium chloride 100 mL/hr at 03/17/20 1323  . clindamycin (CLEOCIN) IV      Gertha Calkin, MD [facility] Pager [phone] If 7PM-7AM, please contact night-coverage [email or web address] Password [email or web address] 03/17/2020, 2:13 PM

## 2020-03-18 ENCOUNTER — Encounter: Payer: Self-pay | Admitting: Internal Medicine

## 2020-03-18 ENCOUNTER — Inpatient Hospital Stay: Payer: Medicare Other | Admitting: Anesthesiology

## 2020-03-18 ENCOUNTER — Encounter
Admission: EM | Disposition: A | Discharge status: Home Health | Payer: Self-pay | Source: Home / Self Care | Attending: Internal Medicine

## 2020-03-18 ENCOUNTER — Ambulatory Visit: Payer: Medicare Other | Admitting: Urology

## 2020-03-18 DIAGNOSIS — K529 Noninfective gastroenteritis and colitis, unspecified: Secondary | ICD-10-CM

## 2020-03-18 DIAGNOSIS — L988 Other specified disorders of the skin and subcutaneous tissue: Secondary | ICD-10-CM

## 2020-03-18 HISTORY — PX: INCISION AND DRAINAGE ABSCESS: SHX5864

## 2020-03-18 LAB — CBC
HCT: 49.1 % — ABNORMAL HIGH (ref 36.0–46.0)
Hemoglobin: 16 g/dL — ABNORMAL HIGH (ref 12.0–15.0)
MCH: 29.4 pg (ref 26.0–34.0)
MCHC: 32.6 g/dL (ref 30.0–36.0)
MCV: 90.1 fL (ref 80.0–100.0)
Platelets: 172 10*3/uL (ref 150–400)
RBC: 5.45 MIL/uL — ABNORMAL HIGH (ref 3.87–5.11)
RDW: 13.6 % (ref 11.5–15.5)
WBC: 11 10*3/uL — ABNORMAL HIGH (ref 4.0–10.5)
nRBC: 0 % (ref 0.0–0.2)

## 2020-03-18 LAB — AEROBIC CULTURE W GRAM STAIN (SUPERFICIAL SPECIMEN): Gram Stain: NONE SEEN

## 2020-03-18 SURGERY — INCISION AND DRAINAGE, ABSCESS
Anesthesia: General | Laterality: Right

## 2020-03-18 MED ORDER — BUPIVACAINE-EPINEPHRINE (PF) 0.5% -1:200000 IJ SOLN
INTRAMUSCULAR | Status: DC | PRN
  Administered 2020-03-18: 15 mL via PERINEURAL

## 2020-03-18 MED ORDER — ACETAMINOPHEN 10 MG/ML IV SOLN
INTRAVENOUS | Status: AC
  Filled 2020-03-18: qty 100

## 2020-03-18 MED ORDER — ONDANSETRON HCL 4 MG/2ML IJ SOLN
INTRAMUSCULAR | Status: DC | PRN
  Administered 2020-03-18: 4 mg via INTRAVENOUS

## 2020-03-18 MED ORDER — ONDANSETRON HCL 4 MG/2ML IJ SOLN
INTRAMUSCULAR | Status: AC
  Filled 2020-03-18: qty 2

## 2020-03-18 MED ORDER — LIDOCAINE HCL (CARDIAC) PF 100 MG/5ML IV SOSY
PREFILLED_SYRINGE | INTRAVENOUS | Status: DC | PRN
  Administered 2020-03-18: 100 mg via INTRAVENOUS

## 2020-03-18 MED ORDER — ACETAMINOPHEN 10 MG/ML IV SOLN
INTRAVENOUS | Status: DC | PRN
  Administered 2020-03-18: 1000 mg via INTRAVENOUS

## 2020-03-18 MED ORDER — DEXAMETHASONE SODIUM PHOSPHATE 10 MG/ML IJ SOLN
INTRAMUSCULAR | Status: DC | PRN
  Administered 2020-03-18: 10 mg via INTRAVENOUS

## 2020-03-18 MED ORDER — FENTANYL CITRATE (PF) 100 MCG/2ML IJ SOLN
INTRAMUSCULAR | Status: AC
  Administered 2020-03-18: 25 ug via INTRAVENOUS
  Filled 2020-03-18: qty 2

## 2020-03-18 MED ORDER — KETOROLAC TROMETHAMINE 30 MG/ML IJ SOLN
15.0000 mg | Freq: Four times a day (QID) | INTRAMUSCULAR | Status: DC | PRN
  Administered 2020-03-18 – 2020-03-19 (×2): 15 mg via INTRAVENOUS
  Filled 2020-03-18 (×2): qty 1

## 2020-03-18 MED ORDER — PROPOFOL 10 MG/ML IV BOLUS
INTRAVENOUS | Status: AC
  Filled 2020-03-18: qty 20

## 2020-03-18 MED ORDER — OXYCODONE HCL 5 MG PO TABS
5.0000 mg | ORAL_TABLET | ORAL | Status: DC | PRN
  Administered 2020-03-19: 5 mg via ORAL
  Filled 2020-03-18: qty 1

## 2020-03-18 MED ORDER — DEXAMETHASONE SODIUM PHOSPHATE 10 MG/ML IJ SOLN
INTRAMUSCULAR | Status: AC
  Filled 2020-03-18: qty 1

## 2020-03-18 MED ORDER — FENTANYL CITRATE (PF) 100 MCG/2ML IJ SOLN
25.0000 ug | INTRAMUSCULAR | Status: AC | PRN
  Administered 2020-03-18 (×4): 25 ug via INTRAVENOUS

## 2020-03-18 MED ORDER — LIDOCAINE HCL (PF) 2 % IJ SOLN
INTRAMUSCULAR | Status: AC
  Filled 2020-03-18: qty 5

## 2020-03-18 MED ORDER — FENTANYL CITRATE (PF) 100 MCG/2ML IJ SOLN
INTRAMUSCULAR | Status: DC | PRN
  Administered 2020-03-18 (×2): 25 ug via INTRAVENOUS

## 2020-03-18 MED ORDER — OXYCODONE HCL 5 MG/5ML PO SOLN: 5.0000 mg | Freq: Once | ORAL | Status: DC | PRN

## 2020-03-18 MED ORDER — CLINDAMYCIN PHOSPHATE 600 MG/50ML IV SOLN
INTRAVENOUS | Status: AC
  Administered 2020-03-18: 600 mg via INTRAVENOUS
  Filled 2020-03-18: qty 50

## 2020-03-18 MED ORDER — OXYCODONE HCL 5 MG PO TABS: 5.0000 mg | ORAL_TABLET | Freq: Once | ORAL | Status: DC | PRN

## 2020-03-18 MED ORDER — FENTANYL CITRATE (PF) 100 MCG/2ML IJ SOLN
INTRAMUSCULAR | Status: AC
  Filled 2020-03-18: qty 2

## 2020-03-18 MED ORDER — PROPOFOL 10 MG/ML IV BOLUS
INTRAVENOUS | Status: DC | PRN
  Administered 2020-03-18: 120 mg via INTRAVENOUS

## 2020-03-18 MED ORDER — BUPIVACAINE LIPOSOME 1.3 % IJ SUSP
INTRAMUSCULAR | Status: DC | PRN
  Administered 2020-03-18: 15 mL

## 2020-03-18 SURGICAL SUPPLY — 30 items
BNDG GAUZE 4.5X4.1 6PLY STRL (MISCELLANEOUS) ×2 IMPLANT
CANISTER SUCT 1200ML W/VALVE (MISCELLANEOUS) ×2 IMPLANT
CHLORAPREP W/TINT 26 (MISCELLANEOUS) ×2 IMPLANT
CNTNR SPEC 2.5X3XGRAD LEK (MISCELLANEOUS) ×1
CONT SPEC 4OZ STER OR WHT (MISCELLANEOUS) ×1
CONTAINER SPEC 2.5X3XGRAD LEK (MISCELLANEOUS) ×1 IMPLANT
COVER WAND RF STERILE (DRAPES) ×2 IMPLANT
DRAIN PENROSE 1/4X12 LTX STRL (WOUND CARE) ×2 IMPLANT
DRAPE LAPAROTOMY 77X122 PED (DRAPES) ×2 IMPLANT
DRSG GAUZE FLUFF 36X18 (GAUZE/BANDAGES/DRESSINGS) ×2 IMPLANT
ELECT REM PT RETURN 9FT ADLT (ELECTROSURGICAL) ×2
ELECTRODE REM PT RTRN 9FT ADLT (ELECTROSURGICAL) ×1 IMPLANT
GAUZE SPONGE 4X4 12PLY STRL (GAUZE/BANDAGES/DRESSINGS) ×2 IMPLANT
GLOVE SURG SYN 7.0 (GLOVE) ×2 IMPLANT
GLOVE SURG SYN 7.5  E (GLOVE) ×1
GLOVE SURG SYN 7.5 E (GLOVE) ×1 IMPLANT
GOWN STRL REUS W/ TWL LRG LVL3 (GOWN DISPOSABLE) ×2 IMPLANT
GOWN STRL REUS W/TWL LRG LVL3 (GOWN DISPOSABLE) ×2
KIT TURNOVER KIT A (KITS) ×2 IMPLANT
LABEL OR SOLS (LABEL) ×2 IMPLANT
NEEDLE HYPO 22GX1.5 SAFETY (NEEDLE) ×2 IMPLANT
NS IRRIG 500ML POUR BTL (IV SOLUTION) ×2 IMPLANT
PACK BASIN MINOR (MISCELLANEOUS) ×2 IMPLANT
PAD ABD DERMACEA PRESS 5X9 (GAUZE/BANDAGES/DRESSINGS) ×2 IMPLANT
SOL PREP PVP 2OZ (MISCELLANEOUS) ×2
SOLUTION PREP PVP 2OZ (MISCELLANEOUS) ×1 IMPLANT
SPONGE LAP 18X18 RF (DISPOSABLE) ×4 IMPLANT
SWAB CULTURE AMIES ANAERIB BLU (MISCELLANEOUS) ×4 IMPLANT
SYR 20ML LL LF (SYRINGE) ×2 IMPLANT
SYR BULB IRRIG 60ML STRL (SYRINGE) ×2 IMPLANT

## 2020-03-18 NOTE — Op Note (Signed)
  Procedure Date:  03/18/2020  Pre-operative Diagnosis:  Right breast abscess  Post-operative Diagnosis:  Right breast abscess  Procedure:  Incision and Drainage of right breast abscess; excision of necrotic skin edges < 20 cm2  Surgeon:  Howie Ill, MD  Assistant:  Janice Coffin, PA-S  Anesthesia:  General endotracheal  Estimated Blood Loss:  2 ml  Specimens:  None  Complications:  None  Indications for Procedure:  This is a 79 y.o. female with diagnosis of right breast abscess, requiring drainage procedure.  The risks of bleeding, abscess or infection, injury to surrounding structures, and need for further procedures were all discussed with the patient and was willing to proceed.  Description of Procedure: The patient was correctly identified in the preoperative area and brought into the operating room.  The patient was placed supine with VTE prophylaxis in place.  Appropriate time-outs were performed.  Anesthesia was induced and the patient was intubated.  Appropriate antibiotics were infused.  The patient's right breast was prepped and draped in usual sterile fashion.  A 2 cm incision was made over the abscess, revealing purulent fluid and necrotic tissue.  Small Kelly forceps were used to dissect around the abscess tissue to open any remaining pockets of purulent fluid.  The edges of the skin incision were excised using cautery as they were necrotic/ulcerated.  Some fibrinopurulent tissue within the abscess cavity was also excised using cautery.  After drainage was completed, the cavity was irrigated and cleaned.  The wound was packed with 4x4 gauze and covered with dry gauze and tape.  The patient tolerated the procedure well and all counts were correct at the end of the case.   Howie Ill, MD

## 2020-03-18 NOTE — Progress Notes (Signed)
Pt return back to room, drowsy but arousal able, will cont to moniror.

## 2020-03-18 NOTE — Anesthesia Preprocedure Evaluation (Signed)
Anesthesia Evaluation  Patient identified by MRN, date of birth, ID band Patient awake    Reviewed: Allergy & Precautions, H&P , NPO status , Patient's Chart, lab work & pertinent test results  History of Anesthesia Complications Negative for: history of anesthetic complications  Airway Mallampati: III  TM Distance: <3 FB Neck ROM: limited    Dental  (+) Chipped, Poor Dentition, Missing   Pulmonary neg shortness of breath, asthma , COPD,    Pulmonary exam normal        Cardiovascular Exercise Tolerance: Good hypertension, + Peripheral Vascular Disease  Normal cardiovascular exam     Neuro/Psych  Headaches, PSYCHIATRIC DISORDERS  Neuromuscular disease negative psych ROS   GI/Hepatic Neg liver ROS, hiatal hernia, GERD  Medicated and Controlled,  Endo/Other  diabetes, Type 2  Renal/GU Renal disease     Musculoskeletal  (+) Arthritis ,   Abdominal   Peds  Hematology negative hematology ROS (+)   Anesthesia Other Findings Patient is NPO appropriate and reports no nausea or vomiting today.  Past Medical History: No date: Arthritis No date: Asthma No date: Depression No date: GERD (gastroesophageal reflux disease) No date: Headache No date: History of hiatal hernia No date: Hyperlipidemia No date: Hyperlipidemia No date: Hypertension No date: IBS (irritable bowel syndrome) No date: Neuromuscular disorder (HCC)     Comment:  neuropathy  Past Surgical History: No date: ABDOMINAL HYSTERECTOMY No date: APPENDECTOMY 07/2013: BREAST BIOPSY; Left     Comment:  neg No date: CHOLECYSTECTOMY 03/16/2018: ESOPHAGOGASTRODUODENOSCOPY (EGD) WITH PROPOFOL; N/A     Comment:  Procedure: ESOPHAGOGASTRODUODENOSCOPY (EGD) WITH               PROPOFOL;  Surgeon: Toledo, Boykin Nearing, MD;  Location:               [facility] ENDOSCOPY;  Service: Gastroenterology;  Laterality:               N/A; No date: EYE SURGERY     Comment:   cataracts No date: KNEE ARTHROSCOPY No date: SHOULDER ARTHROSCOPY; Left  BMI    Body Mass Index: 30.20 kg/m      Reproductive/Obstetrics negative OB ROS                             Anesthesia Physical Anesthesia Plan  ASA: III  Anesthesia Plan: General LMA   Post-op Pain Management:    Induction: Intravenous  PONV Risk Score and Plan: Dexamethasone, Ondansetron, Midazolam and Treatment may vary due to age or medical condition  Airway Management Planned: LMA  Additional Equipment:   Intra-op Plan:   Post-operative Plan: Extubation in OR  Informed Consent: I have reviewed the patients History and Physical, chart, labs and discussed the procedure including the risks, benefits and alternatives for the proposed anesthesia with the patient or authorized representative who has indicated his/her understanding and acceptance.     Dental Advisory Given  Plan Discussed with: Anesthesiologist, CRNA and Surgeon  Anesthesia Plan Comments: (Patient consented for risks of anesthesia including but not limited to:  - adverse reactions to medications - damage to eyes, teeth, lips or other oral mucosa - nerve damage due to positioning  - sore throat or hoarseness - Damage to heart, brain, nerves, lungs, other parts of body or loss of life  Patient voiced understanding.)        Anesthesia Quick Evaluation

## 2020-03-18 NOTE — Progress Notes (Signed)
PROGRESS NOTE    Ann Benson  ZDG:[id number] DOB: [birth year] DOA: 03/15/2020 PCP: Lynnea Ferrier, MD    Brief Narrative:  This is a 79 year old female with history of IBS, GERD, hypertension and asthma presented to the emergency room with 3 days history of intractable nausea vomiting.  She also had epigastric pain.  In the emergency room hemodynamically stable.  Normal WBC count.  CT scan abdomen pelvis showed no acute process in the abdomen pelvis, diffuse fatty infiltration, esophageal hernia.  She also was found to have right breast abscess.  Was given a dose of clindamycin and admitted to the hospital.   Assessment & Plan:   Principal Problem:   Acute gastroenteritis Active Problems:   COPD (chronic obstructive pulmonary disease) (HCC)   PAD (peripheral artery disease) (HCC)   Essential hypertension   IBS (irritable bowel syndrome)   Stage 3 chronic kidney disease   Lump of right breast   Breast abscess of female   Cellulitis  Acute gastroenteritis: Improving.  Bowel functions improving.  No nausea today.  No adequate stool sample for C. difficile testing.  Use Imodium as needed.  On clear liquid diet and tolerating well, advance to regular diet today.  Breast abscess: Wound culture with MRSA.  Patient found to have right breast abscess.  Seen by surgery.  Underwent I&D under anesthesia today.  Cause unknown.  Biopsy will be helpful.  Cultures will be helpful.  Currently on clindamycin.  COPD: Without exacerbation.  Continue inhalers as needed.  Essential hypertension: Blood pressure stable.  Continue home medications.  IBS: Linzess on hold.   DVT prophylaxis: enoxaparin (LOVENOX) injection 40 mg Start: 03/16/20 1000   Code Status: Full code Family Communication: None Disposition Plan: Status is: Inpatient  Remains inpatient appropriate because:IV treatments appropriate due to intensity of illness or inability to take PO and Inpatient level of care appropriate  due to severity of illness   Dispo: The patient is from: Home              Anticipated d/c is to: Home              Anticipated d/c date is: 1 day              Patient currently is not medically stable to d/c.         Consultants:   General surgery  Procedures:   Incision drainage right breast abscess  Antimicrobials:  Antibiotics Given (last 72 hours)    Date/Time Action Medication Dose Rate   03/16/20 0134 New Bag/Given   clindamycin (CLEOCIN) IVPB 600 mg 600 mg 100 mL/hr   03/17/20 1325 New Bag/Given   clindamycin (CLEOCIN) IVPB 600 mg 600 mg 100 mL/hr   03/17/20 2229 New Bag/Given   clindamycin (CLEOCIN) IVPB 600 mg 600 mg 100 mL/hr   03/18/20 0529 New Bag/Given   clindamycin (CLEOCIN) IVPB 600 mg 600 mg 100 mL/hr   03/18/20 1314 Given   clindamycin (CLEOCIN) IVPB 600 mg 600 mg          Subjective: Patient seen and examined.  She came back from I&D of the right breast.  Feeling sleepy.  Denies any nausea vomiting.  She is ready to eat regular food.  No more diarrhea since yesterday. "I am not sure how I got abscess on my breast"  Objective: Vitals:   03/18/20 1440 03/18/20 1455 03/18/20 1627 03/18/20 1630  BP: (!) 147/67 (!) 144/69  110/87  Pulse: 64  64  63  Resp: 12   16  Temp:  (!) 97 F (36.1 C)  97.7 F (36.5 C)  TempSrc:      SpO2: 99%  100% 99%  Weight:      Height:        Intake/Output Summary (Last 24 hours) at 03/18/2020 1646 Last data filed at 03/18/2020 1455 Gross per 24 hour  Intake 3461.35 ml  Output 1505 ml  Net 1956.35 ml   Filed Weights   03/15/20 1135 03/18/20 1220  Weight: 79.8 kg 79.8 kg    Examination:  General exam: Appears calm and comfortable  Respiratory system: Clear to auscultation. Respiratory effort normal. Cardiovascular system: S1 & S2 heard,  Gastrointestinal system: Soft nontender. Central nervous system: Alert and oriented. No focal neurological deficits. Extremities: Symmetric 5 x 5 power. Skin: No  rashes, lesions or ulcers Psychiatry: Judgement and insight appear normal. Mood & affect appropriate.  Right breast: Immediate postop surgical dressing present.   Data Reviewed: I have personally reviewed following labs and imaging studies  CBC: Recent Labs  Lab 03/15/20 1141 03/16/20 0319 03/18/20 0409  WBC 7.4 9.2 11.0*  HGB 15.3* 15.6* 16.0*  HCT 44.8 44.7 49.1*  MCV 87.8 88.0 90.1  PLT 170 180 172   Basic Metabolic Panel: Recent Labs  Lab 03/15/20 1141 03/16/20 0319 03/17/20 0859  NA 142  --  139  K 4.0  --  3.8  CL 109  --  108  CO2 24  --  23  GLUCOSE 109*  --  93  BUN 11  --  8  CREATININE 0.99 0.92 0.86  CALCIUM 9.5  --  8.8*  MG  --   --  2.1   GFR: Estimated Creatinine Clearance: 52.4 mL/min (by C-G formula based on SCr of 0.86 mg/dL). Liver Function Tests: Recent Labs  Lab 03/15/20 1141 03/17/20 0859  AST 22 23  ALT 18 17  ALKPHOS 51 45  BILITOT 1.1 1.1  PROT 7.3 6.7  ALBUMIN 3.7 3.5   Recent Labs  Lab 03/15/20 1141 03/17/20 0859  LIPASE 19 24   No results for input(s): AMMONIA in the last 168 hours. Coagulation Profile: No results for input(s): INR, PROTIME in the last 168 hours. Cardiac Enzymes: No results for input(s): CKTOTAL, CKMB, CKMBINDEX, TROPONINI in the last 168 hours. BNP (last 3 results) No results for input(s): PROBNP in the last 8760 hours. HbA1C: No results for input(s): HGBA1C in the last 72 hours. CBG: No results for input(s): GLUCAP in the last 168 hours. Lipid Profile: No results for input(s): CHOL, HDL, LDLCALC, TRIG, CHOLHDL, LDLDIRECT in the last 72 hours. Thyroid Function Tests: No results for input(s): TSH, T4TOTAL, FREET4, T3FREE, THYROIDAB in the last 72 hours. Anemia Panel: No results for input(s): VITAMINB12, FOLATE, FERRITIN, TIBC, IRON, RETICCTPCT in the last 72 hours. Sepsis Labs: Recent Labs  Lab 03/16/20 0104 03/16/20 0319  LATICACIDVEN 1.5 1.1    Recent Results (from the past 240 hour(s))   Wound or Superficial Culture     Status: None   Collection Time: 03/15/20 10:10 PM   Specimen: Abscess; Wound  Result Value Ref Range Status   Specimen Description   Final    ABSCESS Performed at [facility], [address], [place], [place] [address]    Special Requests   Final    NONE Performed at [facility], [address] [place], [place] [address]    Gram Stain   Final    NO WBC  SEEN RARE GRAM POSITIVE COCCI IN PAIRS Performed at [facility], [address] [place], [place] [address]    Culture   Final    ABUNDANT METHICILLIN RESISTANT STAPHYLOCOCCUS AUREUS   Report Status 03/18/2020 FINAL  Final   Organism ID, Bacteria METHICILLIN RESISTANT STAPHYLOCOCCUS AUREUS  Final      Susceptibility   Methicillin resistant staphylococcus aureus - MIC*    CIPROFLOXACIN >=8 RESISTANT Resistant     ERYTHROMYCIN 0.5 SENSITIVE Sensitive     GENTAMICIN <=0.5 SENSITIVE Sensitive     OXACILLIN >=4 RESISTANT Resistant     TETRACYCLINE <=1 SENSITIVE Sensitive     VANCOMYCIN <=0.5 SENSITIVE Sensitive     TRIMETH/SULFA <=10 SENSITIVE Sensitive     CLINDAMYCIN <=0.25 SENSITIVE Sensitive     RIFAMPIN <=0.5 SENSITIVE Sensitive     Inducible Clindamycin NEGATIVE Sensitive     * ABUNDANT METHICILLIN RESISTANT STAPHYLOCOCCUS AUREUS  SARS Coronavirus 2 by RT PCR (hospital order, performed in [facility] hospital lab) Nasopharyngeal Nasopharyngeal Swab     Status: None   Collection Time: 03/15/20 11:53 PM   Specimen: Nasopharyngeal Swab  Result Value Ref Range Status   SARS Coronavirus 2 NEGATIVE NEGATIVE Final    Comment: (NOTE) SARS-CoV-2 target nucleic acids are NOT DETECTED.  The SARS-CoV-2 RNA is generally detectable in upper and lower respiratory specimens during the acute phase of infection. The lowest concentration of SARS-CoV-2 viral copies this assay can detect is 250 copies / mL. A negative result does not preclude SARS-CoV-2 infection and should not  be used as the sole basis for treatment or other patient management decisions.  A negative result may occur with improper specimen collection / handling, submission of specimen other than nasopharyngeal swab, presence of viral mutation(s) within the areas targeted by this assay, and inadequate number of viral copies (<250 copies / mL). A negative result must be combined with clinical observations, patient history, and epidemiological information.  Fact Sheet for Patients:   BoilerBrush.com.cy  Fact Sheet for Healthcare Providers: https://pope.com/  This test is not yet approved or  cleared by the [place] FDA and has been authorized for detection and/or diagnosis of SARS-CoV-2 by FDA under an Emergency Use Authorization (EUA).  This EUA will remain in effect (meaning this test can be used) for the duration of the COVID-19 declaration under Section 564(b)(1) of the Act, 21 U.S.C. section 360bbb-3(b)(1), unless the authorization is terminated or revoked sooner.  Performed at [facility], [address] [place], [place] [address]   Culture, blood (routine x 2)     Status: None (Preliminary result)   Collection Time: 03/16/20  1:04 AM   Specimen: BLOOD  Result Value Ref Range Status   Specimen Description BLOOD LEFT ANTECUBITAL  Final   Special Requests   Final    BOTTLES DRAWN AEROBIC AND ANAEROBIC Blood Culture adequate volume   Culture   Final    NO GROWTH 2 DAYS Performed at [facility], [address], [place], [place] [address]    Report Status PENDING  Incomplete  Culture, blood (routine x 2)     Status: None (Preliminary result)   Collection Time: 03/16/20  1:04 AM   Specimen: BLOOD  Result Value Ref Range Status   Specimen Description BLOOD RIGHT ANTECUBITAL  Final   Special Requests   Final    BOTTLES DRAWN AEROBIC AND ANAEROBIC Blood Culture results may not be optimal due to an inadequate  volume of blood received in culture  bottles   Culture   Final    NO GROWTH 2 DAYS Performed at [facility], [address] [place], [place] [address]    Report Status PENDING  Incomplete         Radiology Studies: No results found.      Scheduled Meds: . calcium-vitamin D  1 tablet Oral BID WC  . cholecalciferol  6,000 Units Oral Daily  . DULoxetine  60 mg Oral Daily  . enoxaparin (LOVENOX) injection  40 mg Subcutaneous Q24H  . gabapentin  300 mg Oral BID  . oxybutynin  10 mg Oral Daily  . pantoprazole  40 mg Oral BID AC  . prednisoLONE acetate  1 drop Left Eye TID  . rosuvastatin  5 mg Oral Weekly  . triamterene-hydrochlorothiazide  1 tablet Oral Daily   Continuous Infusions: . sodium chloride 100 mL/hr at 03/18/20 1639  . clindamycin (CLEOCIN) IV 0 mL/hr at 03/18/20 0559     LOS: 1 day    Time spent: 25 minutes    Dorcas CarrowKuber Kaeleb Emond, MD [facility] Pager [phone]

## 2020-03-18 NOTE — Anesthesia Procedure Notes (Signed)
Procedure Name: LMA Insertion Date/Time: 03/18/2020 1:08 PM Performed by: Ginger Carne, CRNA Pre-anesthesia Checklist: Patient identified, Emergency Drugs available, Suction available, Patient being monitored and Timeout performed Patient Re-evaluated:Patient Re-evaluated prior to induction Oxygen Delivery Method: Circle system utilized Preoxygenation: Pre-oxygenation with 100% oxygen Induction Type: IV induction LMA: LMA inserted LMA Size: 4.5 Tube type: Oral Number of attempts: 1 Placement Confirmation: positive ETCO2 and breath sounds checked- equal and bilateral Tube secured with: Tape Dental Injury: Teeth and Oropharynx as per pre-operative assessment

## 2020-03-18 NOTE — Progress Notes (Signed)
[facility] SURGICAL PROGRESS NOTE  Hospital Day(s): 1.   Post op day(s): Day of Surgery.   Interval History:  Patient seen and examined no acute events or new complaints overnight.  Patient reports continued soreness to her right breast with erythema, no further drainage No fever or chills Slight increase in leukocytosis to 11K this morning Plan for I&D in the OR this afternoon  Vital signs in last 24 hours: [min-max] current  Temp:  [98.1 F (36.7 C)-98.2 F (36.8 C)] 98.2 F (36.8 C) (08/08 2321) Pulse Rate:  [64-81] 75 (08/08 2321) Resp:  [16] 16 (08/08 2321) BP: (140-176)/(76-81) 140/81 (08/08 2321) SpO2:  [96 %-99 %] 96 % (08/08 2321)     Height: 5\' 4"  (162.6 cm) Weight: 79.8 kg BMI (Calculated): 30.2   Intake/Output last 2 shifts:  08/08 0701 - 08/09 0700 In: -  Out: 2650 [Urine:2650]   Physical Exam:  Constitutional: alert, cooperative and no distress  Respiratory: breathing non-labored at rest  Cardiovascular: regular rate and sinus rhythm  Integumentary: At the 7 o'clock position on the right breast there is significant erythema, warmth, and tenderness. There is a central scab to this area. No active drainage.   Labs:  CBC Latest Ref Rng & Units 03/18/2020 03/16/2020 03/15/2020  WBC 4.0 - 10.5 K/uL 11.0(H) 9.2 7.4  Hemoglobin 12.0 - 15.0 g/dL 16.0(H) 15.6(H) 15.3(H)  Hematocrit 36 - 46 % 49.1(H) 44.7 44.8  Platelets 150 - 400 K/uL 172 180 170   CMP Latest Ref Rng & Units 03/17/2020 03/16/2020 03/15/2020  Glucose 70 - 99 mg/dL 93 - 05/15/2020)  BUN 8 - 23 mg/dL 8 - 11  Creatinine 102(V - 1.00 mg/dL 2.53 6.64 4.03  Sodium 135 - 145 mmol/L 139 - 142  Potassium 3.5 - 5.1 mmol/L 3.8 - 4.0  Chloride 98 - 111 mmol/L 108 - 109  CO2 22 - 32 mmol/L 23 - 24  Calcium 8.9 - 10.3 mg/dL 4.74) - 9.5  Total Protein 6.5 - 8.1 g/dL 6.7 - 7.3  Total Bilirubin 0.3 - 1.2 mg/dL 1.1 - 1.1  Alkaline Phos 38 - 126 U/L 45 - 51  AST 15 - 41 U/L 23 - 22  ALT 0 - 44 U/L 17 - 18      Imaging studies: No new pertinent imaging studies   Assessment/Plan:  79 y.o. female with right breast abscess   - Plan to go to the OR this afternoon for I&D with Dr 94 pending OR/Anesthesia availability.   - Consent for the above obtained yesterday (08/08)  - NPO + IVF resuscitation  - Continue IV Abx (Clindamycin)  - Pain control prn  - Monitor leukocytosis; fever curve   - Further management per primary service    All of the above findings and recommendations were discussed with the patient, and the medical team, and all of patient's questions were answered to her expressed satisfaction.  -- 05-09-1993, PA-C [facility] 03/18/2020, 8:20 AM [phone] M-F: 7am - 4pm

## 2020-03-18 NOTE — Progress Notes (Signed)
Pt to OR for I&D

## 2020-03-18 NOTE — Anesthesia Postprocedure Evaluation (Signed)
Anesthesia Post Note  Patient: Ann Benson  Procedure(s) Performed: INCISION AND DRAINAGE ABSCESS-Breast (Right )  Patient location during evaluation: PACU Anesthesia Type: General Level of consciousness: awake and alert Pain management: pain level controlled Vital Signs Assessment: post-procedure vital signs reviewed and stable Respiratory status: spontaneous breathing, nonlabored ventilation, respiratory function stable and patient connected to nasal cannula oxygen Cardiovascular status: blood pressure returned to baseline and stable Postop Assessment: no apparent nausea or vomiting Anesthetic complications: no   No complications documented.   Last Vitals:  Vitals:   03/18/20 1410 03/18/20 1425  BP: 136/64 138/70  Pulse: 63 64  Resp: 20 12  Temp:    SpO2: 95% 97%    Last Pain:  Vitals:   03/18/20 1425  TempSrc:   PainSc: 5                  Cleda Mccreedy Macil Crady

## 2020-03-18 NOTE — Transfer of Care (Signed)
Immediate Anesthesia Transfer of Care Note  Patient: Ann Benson  Procedure(s) Performed: INCISION AND DRAINAGE ABSCESS-Breast (Right )  Patient Location: PACU  Anesthesia Type:General  Level of Consciousness: awake and alert   Airway & Oxygen Therapy: Patient Spontanous Breathing and Patient connected to face mask oxygen  Post-op Assessment: Report given to RN and Post -op Vital signs reviewed and stable  Post vital signs: Reviewed and stable  Last Vitals:  Vitals Value Taken Time  BP 130/61 03/18/20 1356  Temp 36.2 C 03/18/20 1355  Pulse 71 03/18/20 1359  Resp 13 03/18/20 1402  SpO2 100 % 03/18/20 1359  Vitals shown include unvalidated device data.  Last Pain:  Vitals:   03/18/20 1355  TempSrc:   PainSc: 8          Complications: No complications documented.

## 2020-03-19 ENCOUNTER — Encounter: Payer: Self-pay | Admitting: Urology

## 2020-03-19 ENCOUNTER — Encounter: Payer: Self-pay | Admitting: Surgery

## 2020-03-19 LAB — MAGNESIUM: Magnesium: 2.3 mg/dL (ref 1.7–2.4)

## 2020-03-19 LAB — BASIC METABOLIC PANEL
Anion gap: 9 (ref 5–15)
BUN: 15 mg/dL (ref 8–23)
CO2: 22 mmol/L (ref 22–32)
Calcium: 9.8 mg/dL (ref 8.9–10.3)
Chloride: 106 mmol/L (ref 98–111)
Creatinine, Ser: 0.92 mg/dL (ref 0.44–1.00)
GFR calc Af Amer: >60 mL/min (ref >60)
GFR calc non Af Amer: 58 mL/min — ABNORMAL LOW (ref >60)
Glucose, Bld: 122 mg/dL — ABNORMAL HIGH (ref 70–99)
Potassium: 4.9 mmol/L (ref 3.5–5.1)
Sodium: 137 mmol/L (ref 135–145)

## 2020-03-19 LAB — CBC WITH DIFFERENTIAL/PLATELET
Abs Immature Granulocytes: 0.05 10*3/uL (ref 0.00–0.07)
Basophils Absolute: 0 10*3/uL (ref 0.0–0.1)
Basophils Relative: 0 %
Eosinophils Absolute: 0 10*3/uL (ref 0.0–0.5)
Eosinophils Relative: 0 %
HCT: 43.4 % (ref 36.0–46.0)
Hemoglobin: 15 g/dL (ref 12.0–15.0)
Immature Granulocytes: 1 %
Lymphocytes Relative: 18 %
Lymphs Abs: 1.4 10*3/uL (ref 0.7–4.0)
MCH: 30.3 pg (ref 26.0–34.0)
MCHC: 34.6 g/dL (ref 30.0–36.0)
MCV: 87.7 fL (ref 80.0–100.0)
Monocytes Absolute: 0.3 10*3/uL (ref 0.1–1.0)
Monocytes Relative: 4 %
Neutro Abs: 5.9 10*3/uL (ref 1.7–7.7)
Neutrophils Relative %: 77 %
Platelets: 188 10*3/uL (ref 150–400)
RBC: 4.95 MIL/uL (ref 3.87–5.11)
RDW: 13.6 % (ref 11.5–15.5)
WBC: 7.7 10*3/uL (ref 4.0–10.5)
nRBC: 0 % (ref 0.0–0.2)

## 2020-03-19 LAB — MRSA PCR SCREENING: MRSA by PCR: NEGATIVE

## 2020-03-19 NOTE — Progress Notes (Addendum)
[facility] SURGICAL PROGRESS NOTE  Hospital Day(s): 2.   Post op day(s): 1 Day Post-Op.   Interval History:  Patient seen and examined no acute events or new complaints overnight.  Patient reports she still has some soreness in her right breast but is overall improved.  No fever, chills, nausea, or emesis Labs are reassuring this morning, no leukocytosis, renal function normal, no electrolyte derangement Wound Cx from 08/06 grew out MRSA She continues on clindamycin   Vital signs in last 24 hours: [min-max] current  Temp:  [97 F (36.1 C)-98.2 F (36.8 C)] 97.5 F (36.4 C) (08/10 0412) Pulse Rate:  [62-80] 63 (08/10 0412) Resp:  [12-20] 14 (08/10 0412) BP: (110-156)/(56-87) 137/65 (08/10 0412) SpO2:  [92 %-100 %] 95 % (08/10 0412) Weight:  [79.8 kg] 79.8 kg (08/09 1220)     Height: 5\' 4"  (162.6 cm) Weight: 79.8 kg BMI (Calculated): 30.18   Intake/Output last 2 shifts:  08/09 0701 - 08/10 0700 In: 4822.1 [P.O.:485; I.V.:4087.1; IV Piggyback:250] Out: 1480 [Urine:1475; Blood:5]   Physical Exam:  Constitutional: alert, cooperative and no distress  Respiratory: breathing non-labored at rest  Cardiovascular: regular rate and sinus rhythm  Integumentary: I&D site to the 7 o'clock position on the right breast, erythematous, indurated but no fluctuance. Packing in place, no purulent drainage   Labs:  CBC Latest Ref Rng & Units 03/19/2020 03/18/2020 03/16/2020  WBC 4.0 - 10.5 K/uL 7.7 11.0(H) 9.2  Hemoglobin 12.0 - 15.0 g/dL 05/16/2020 16.0(H) 15.6(H)  Hematocrit 36 - 46 % 43.4 49.1(H) 44.7  Platelets 150 - 400 K/uL 188 172 180   CMP Latest Ref Rng & Units 03/19/2020 03/17/2020 03/16/2020  Glucose 70 - 99 mg/dL 05/16/2020) 93 -  BUN 8 - 23 mg/dL 15 8 -  Creatinine 846(N - 1.00 mg/dL 6.29 5.28 4.13  Sodium 135 - 145 mmol/L 137 139 -  Potassium 3.5 - 5.1 mmol/L 4.9 3.8 -  Chloride 98 - 111 mmol/L 106 108 -  CO2 22 - 32 mmol/L 22 23 -  Calcium 8.9 - 10.3 mg/dL 9.8 2.44) -   Total Protein 6.5 - 8.1 g/dL - 6.7 -  Total Bilirubin 0.3 - 1.2 mg/dL - 1.1 -  Alkaline Phos 38 - 126 U/L - 45 -  AST 15 - 41 U/L - 23 -  ALT 0 - 44 U/L - 17 -     Imaging studies: No new pertinent imaging studies   Assessment/Plan:  79 y.o. female overall doing well 1 Day Post-Op s/p I&D of right breast abscess.   - Continue IV ABx (clindamycin) in house: transition to PO at discharge complete ~10 days total   - Will provide wound care instructions; review packing with patient --> I will also order HH today   - Pain control prn    - Discharge Planning: Will plan on DC tomorrow morning, ABx as above, follow up in 1-2 weeks with Dr 94.    All of the above findings and recommendations were discussed with the patient, and the medical team, and all of patient's questions were answered to her expressed satisfaction.  -- Aleen Campi, PA-C [facility] 03/19/2020, 7:32 AM [phone] M-F: 7am - 4pm

## 2020-03-19 NOTE — Progress Notes (Signed)
PROGRESS NOTE    Ann Benson  EPP:[id number] DOB: 29-Dec-1940 DOA: 03/15/2020 PCP: Lynnea Ferrier, MD    Brief Narrative:  This is a 79 year old female with history of IBS, GERD, hypertension and asthma presented to the emergency room with 3 days history of intractable nausea vomiting.  She also had epigastric pain.  In the emergency room hemodynamically stable.  Normal WBC count.  CT scan abdomen pelvis showed no acute process in the abdomen pelvis, diffuse fatty infiltration, esophageal hernia.  She also was found to have right breast abscess.  Was given a dose of clindamycin and admitted to the hospital.   Assessment & Plan:   Principal Problem:   Acute gastroenteritis Active Problems:   COPD (chronic obstructive pulmonary disease) (HCC)   PAD (peripheral artery disease) (HCC)   Essential hypertension   IBS (irritable bowel syndrome)   Stage 3 chronic kidney disease   Lump of right breast   Breast abscess of female   Cellulitis  Acute gastroenteritis: Improving.  Bowel functions improving.  No nausea today.  No adequate stool sample for C. difficile testing.  Use Imodium as needed.  Advancing to regular diet, she is tolerating with some nausea using nausea medications. Patient also has history of significant hiatal hernia, scheduled for upper GI endoscopy in 2 weeks.  Increased dose of Protonix to 40 mg twice a day.  Breast abscess: Wound culture with MRSA.  Patient found to have right breast abscess. Status post I&D on 8/9.  Cultures not sent.  Previous culture with MRSA treating with IV clindamycin.  Still has significant nausea, will continue clindamycin today and change to oral tomorrow.  Surgery do not suspect necrotic mass or tumor.  COPD: Without exacerbation.  Continue inhalers as needed.  Stable.  Essential hypertension: Blood pressure stable.  Continue home medications.  IBS: Linzess on hold.   DVT prophylaxis: enoxaparin (LOVENOX) injection 40 mg Start:  03/16/20 1000   Code Status: Full code Family Communication: Daughter-in-law at the bedside. Disposition Plan: Status is: Inpatient  Remains inpatient appropriate because:IV treatments appropriate due to intensity of illness or inability to take PO and Inpatient level of care appropriate due to severity of illness   Dispo: The patient is from: Home              Anticipated d/c is to: Home              Anticipated d/c date is: Advertising account executive.              Patient currently is not medically stable to d/c.         Consultants:   General surgery  Procedures:   Incision drainage right breast abscess  Antimicrobials:  Antibiotics Given (last 72 hours)    Date/Time Action Medication Dose Rate   03/17/20 1325 New Bag/Given   clindamycin (CLEOCIN) IVPB 600 mg 600 mg 100 mL/hr   03/17/20 2229 New Bag/Given   clindamycin (CLEOCIN) IVPB 600 mg 600 mg 100 mL/hr   03/18/20 0529 New Bag/Given   clindamycin (CLEOCIN) IVPB 600 mg 600 mg 100 mL/hr   03/18/20 1314 Given   clindamycin (CLEOCIN) IVPB 600 mg 600 mg    03/18/20 2158 New Bag/Given   clindamycin (CLEOCIN) IVPB 600 mg 600 mg 100 mL/hr   03/19/20 0506 New Bag/Given   clindamycin (CLEOCIN) IVPB 600 mg 600 mg 100 mL/hr   03/19/20 1324 New Bag/Given   clindamycin (CLEOCIN) IVPB 600 mg 600 mg 100 mL/hr  Subjective: Patient seen and examined.  Still has some nausea and we talked much about her hiatal hernia giving symptoms.  No diarrhea since last 24 hours.  She was not sure she can take oral antibiotics.  Objective: Vitals:   03/19/20 0014 03/19/20 0412 03/19/20 0737 03/19/20 1241  BP: 112/61 137/65 (!) 164/76 (!) 112/58  Pulse: 63 63 (!) 58 75  Resp: 14 14 18 18   Temp: 98.2 F (36.8 C) (!) 97.5 F (36.4 C) 97.8 F (36.6 C) 97.6 F (36.4 C)  TempSrc:  Oral Oral Oral  SpO2: 92% 95% 96% (!) 88%  Weight:      Height:        Intake/Output Summary (Last 24 hours) at 03/19/2020 1326 Last data filed at 03/19/2020  0551 Gross per 24 hour  Intake 2060.71 ml  Output 930 ml  Net 1130.71 ml   Filed Weights   03/15/20 1135 03/18/20 1220  Weight: 79.8 kg 79.8 kg    Examination:  General exam: Appears calm and comfortable  Respiratory system: Clear to auscultation. Respiratory effort normal. Cardiovascular system: S1 & S2 heard,  Gastrointestinal system: Soft nontender. Central nervous system: Alert and oriented. No focal neurological deficits. Extremities: Symmetric 5 x 5 power. Skin: No rashes, lesions or ulcers Psychiatry: Judgement and insight appear normal. Mood & affect appropriate.  Right breast:  Surgical dressing present with wound packed, dry, minimal erythema surrounding the right breast surgical wound.   Data Reviewed: I have personally reviewed following labs and imaging studies  CBC: Recent Labs  Lab 03/15/20 1141 03/16/20 0319 03/18/20 0409 03/19/20 0445  WBC 7.4 9.2 11.0* 7.7  NEUTROABS  --   --   --  5.9  HGB 15.3* 15.6* 16.0* 15.0  HCT 44.8 44.7 49.1* 43.4  MCV 87.8 88.0 90.1 87.7  PLT 170 180 172 188   Basic Metabolic Panel: Recent Labs  Lab 03/15/20 1141 03/16/20 0319 03/17/20 0859 03/19/20 0445  NA 142  --  139 137  K 4.0  --  3.8 4.9  CL 109  --  108 106  CO2 24  --  23 22  GLUCOSE 109*  --  93 122*  BUN 11  --  8 15  CREATININE 0.99 0.92 0.86 0.92  CALCIUM 9.5  --  8.8* 9.8  MG  --   --  2.1 2.3   GFR: Estimated Creatinine Clearance: 49 mL/min (by C-G formula based on SCr of 0.92 mg/dL). Liver Function Tests: Recent Labs  Lab 03/15/20 1141 03/17/20 0859  AST 22 23  ALT 18 17  ALKPHOS 51 45  BILITOT 1.1 1.1  PROT 7.3 6.7  ALBUMIN 3.7 3.5   Recent Labs  Lab 03/15/20 1141 03/17/20 0859  LIPASE 19 24   No results for input(s): AMMONIA in the last 168 hours. Coagulation Profile: No results for input(s): INR, PROTIME in the last 168 hours. Cardiac Enzymes: No results for input(s): CKTOTAL, CKMB, CKMBINDEX, TROPONINI in the last 168  hours. BNP (last 3 results) No results for input(s): PROBNP in the last 8760 hours. HbA1C: No results for input(s): HGBA1C in the last 72 hours. CBG: No results for input(s): GLUCAP in the last 168 hours. Lipid Profile: No results for input(s): CHOL, HDL, LDLCALC, TRIG, CHOLHDL, LDLDIRECT in the last 72 hours. Thyroid Function Tests: No results for input(s): TSH, T4TOTAL, FREET4, T3FREE, THYROIDAB in the last 72 hours. Anemia Panel: No results for input(s): VITAMINB12, FOLATE, FERRITIN, TIBC, IRON, RETICCTPCT in the last 72 hours. Sepsis  Labs: Recent Labs  Lab 03/16/20 0104 03/16/20 0319  LATICACIDVEN 1.5 1.1    Recent Results (from the past 240 hour(s))  Wound or Superficial Culture     Status: None   Collection Time: 03/15/20 10:10 PM   Specimen: Abscess; Wound  Result Value Ref Range Status   Specimen Description   Final    ABSCESS Performed at [facility], [address] [place], [place] [address]    Special Requests   Final    NONE Performed at [facility], [address] [place], [place] [address]    Gram Stain   Final    NO WBC SEEN RARE GRAM POSITIVE COCCI IN PAIRS Performed at [facility], [address] [place], [place] [address]    Culture   Final    ABUNDANT METHICILLIN RESISTANT STAPHYLOCOCCUS AUREUS   Report Status 03/18/2020 FINAL  Final   Organism ID, Bacteria METHICILLIN RESISTANT STAPHYLOCOCCUS AUREUS  Final      Susceptibility   Methicillin resistant staphylococcus aureus - MIC*    CIPROFLOXACIN >=8 RESISTANT Resistant     ERYTHROMYCIN 0.5 SENSITIVE Sensitive     GENTAMICIN <=0.5 SENSITIVE Sensitive     OXACILLIN >=4 RESISTANT Resistant     TETRACYCLINE <=1 SENSITIVE Sensitive     VANCOMYCIN <=0.5 SENSITIVE Sensitive     TRIMETH/SULFA <=10 SENSITIVE Sensitive     CLINDAMYCIN <=0.25 SENSITIVE Sensitive     RIFAMPIN <=0.5 SENSITIVE Sensitive     Inducible Clindamycin NEGATIVE Sensitive     * ABUNDANT METHICILLIN  RESISTANT STAPHYLOCOCCUS AUREUS  SARS Coronavirus 2 by RT PCR (hospital order, performed in [facility] hospital lab) Nasopharyngeal Nasopharyngeal Swab     Status: None   Collection Time: 03/15/20 11:53 PM   Specimen: Nasopharyngeal Swab  Result Value Ref Range Status   SARS Coronavirus 2 NEGATIVE NEGATIVE Final    Comment: (NOTE) SARS-CoV-2 target nucleic acids are NOT DETECTED.  The SARS-CoV-2 RNA is generally detectable in upper and lower respiratory specimens during the acute phase of infection. The lowest concentration of SARS-CoV-2 viral copies this assay can detect is 250 copies / mL. A negative result does not preclude SARS-CoV-2 infection and should not be used as the sole basis for treatment or other patient management decisions.  A negative result may occur with improper specimen collection / handling, submission of specimen other than nasopharyngeal swab, presence of viral mutation(s) within the areas targeted by this assay, and inadequate number of viral copies (<250 copies / mL). A negative result must be combined with clinical observations, patient history, and epidemiological information.  Fact Sheet for Patients:   BoilerBrush.com.cy  Fact Sheet for Healthcare Providers: https://pope.com/  This test is not yet approved or  cleared by the [place] FDA and has been authorized for detection and/or diagnosis of SARS-CoV-2 by FDA under an Emergency Use Authorization (EUA).  This EUA will remain in effect (meaning this test can be used) for the duration of the COVID-19 declaration under Section 564(b)(1) of the Act, 21 U.S.C. section 360bbb-3(b)(1), unless the authorization is terminated or revoked sooner.  Performed at [facility], [address] [place], [place] [address]   Culture, blood (routine x 2)     Status: None (Preliminary result)   Collection Time: 03/16/20  1:04 AM   Specimen: BLOOD   Result Value Ref Range Status   Specimen Description BLOOD LEFT ANTECUBITAL  Final   Special Requests   Final    BOTTLES DRAWN AEROBIC  AND ANAEROBIC Blood Culture adequate volume   Culture   Final    NO GROWTH 3 DAYS Performed at [facility], [address] [place], [place] [address]    Report Status PENDING  Incomplete  Culture, blood (routine x 2)     Status: None (Preliminary result)   Collection Time: 03/16/20  1:04 AM   Specimen: BLOOD  Result Value Ref Range Status   Specimen Description BLOOD RIGHT ANTECUBITAL  Final   Special Requests   Final    BOTTLES DRAWN AEROBIC AND ANAEROBIC Blood Culture results may not be optimal due to an inadequate volume of blood received in culture bottles   Culture   Final    NO GROWTH 3 DAYS Performed at [facility], [address], [place], [place] [address]    Report Status PENDING  Incomplete         Radiology Studies: No results found.      Scheduled Meds: . calcium-vitamin D  1 tablet Oral BID WC  . cholecalciferol  6,000 Units Oral Daily  . DULoxetine  60 mg Oral Daily  . enoxaparin (LOVENOX) injection  40 mg Subcutaneous Q24H  . gabapentin  300 mg Oral BID  . oxybutynin  10 mg Oral Daily  . pantoprazole  40 mg Oral BID AC  . prednisoLONE acetate  1 drop Left Eye TID  . rosuvastatin  5 mg Oral Weekly  . triamterene-hydrochlorothiazide  1 tablet Oral Daily   Continuous Infusions: . sodium chloride 50 mL/hr at 03/19/20 1325  . clindamycin (CLEOCIN) IV 600 mg (03/19/20 1324)     LOS: 2 days    Time spent: 25 minutes    Dorcas Carrow, MD [facility] Pager [phone]

## 2020-03-19 NOTE — TOC Initial Note (Signed)
Transition of Care [facility]) - Initial/Assessment Note    Patient Details  Name: Ann Benson MRN: [id number] Date of Birth: May 25, 1941  Transition of Care [facility]) CM/SW Contact:    Ann Hashimoto, LCSW Phone Number: 03/19/2020, 11:12 AM  Clinical Narrative:   Met with pt and spoke with step-daughter when here to discuss home health needs. Both feel she will need HHRN and maybe PT follow up at home. Husband will need to be taught dressing changes with HHRN. Step-daughter reports he is quesy with wounds. Pt was independent prior to admission and took care of the home management at home. She and husband drive. Will await PT eval and see if need HHPT since has been in a bed for a few days. Will touch base later today after PT eval. Work on discharge needs.                [place] Dis[place] Barriers to Discharge: Continued Medical Work up   Patient Goals and CMS Choice Patient states their goals for this hospitalization and ongoing recovery are:: I need to move around I have been in a bed for two days CMS Medicare.gov Compare Post Acute Care list provided to:: Patient Choice offered to / list presented to : Patient  Expected Discharge Plan and Services Expected Discharge Plan: [place] In-house Referral: Clinical Social Work   Post Acute Care Choice: [place] arrangements for the past 2 months: [place]: RN, PT [place] Agency: [place] ([facility]) Date HH Agency Contacted: 03/19/20 Time [place]: 58 Representative spoke with at [place]: Doctor Phillips Arrangements/Services Living arrangements for the past 2 months: [place] with:: Spouse Patient language and need for interpreter reviewed:: No Do you feel safe going back to the place where you live?: Yes      Need for Family Participation in Patient Care: No  (Comment) Care giver support system in place?: Yes (comment) Current home services: DME (cane) Criminal Activity/Legal Involvement Pertinent to Current Situation/Hospitalization: No - Comment as needed  Activities of Daily Living Home Assistive Devices/Equipment: None ADL Screening (condition at time of admission) Patient's cognitive ability adequate to safely complete daily activities?: Yes Is the patient deaf or have difficulty hearing?: No Does the patient have difficulty seeing, even when wearing glasses/contacts?: No Does the patient have difficulty concentrating, remembering, or making decisions?: No Patient able to express need for assistance with ADLs?: Yes Does the patient have difficulty dressing or bathing?: No Independently performs ADLs?: Yes (appropriate for developmental age) Does the patient have difficulty walking or climbing stairs?: No Weakness of Legs: None Weakness of Arms/Hands: None  Permission Sought/Granted Permission sought to share information with : Facility Sport and exercise psychologist, Family Supports Permission granted to share information with : Yes, Verbal Permission Granted  Share Information with NAME: Ann Benson  Permission granted to share info w AGENCY: [facility]  Permission granted to share info w Relationship: husband  Permission granted to share info w Contact Information: Ann Benson  Emotional Assessment Appearance:: Appears stated age Attitude/Demeanor/Rapport: Gracious, Engaged Affect (typically observed): Adaptable, Accepting Orientation: : Oriented to Self, Oriented to Place, Oriented to  Time, Oriented to Situation Alcohol / Substance Use: Not Applicable Psych Involvement: No (comment)  Admission diagnosis:  Dehydration [E86.0] Acute gastroenteritis [  K52.9] Generalized abdominal pain [R10.84] Breast mass in female [N63.0] Breast abscess of female [N61.1] Nausea vomiting and diarrhea [R11.2, R19.7] Cellulitis of right breast [N61.0] Cellulitis  [L03.90] Patient Active Problem List   Diagnosis Date Noted  . Cellulitis 03/17/2020  . Breast abscess of female   . Acute gastroenteritis 03/16/2020  . Lump of right breast 03/16/2020  . Dystrophic nail 10/26/2019  . Diabetic neuropathy ([place]) 10/26/2019  . Anxiety 01/17/2018  . Hyperlipidemia 01/17/2018  . IBS (irritable bowel syndrome) 01/17/2018  . Intra-abdominal hernia 01/17/2018  . Migraine 01/17/2018  . Lumbar degenerative disc disease 11/30/2017  . Radicular leg pain 09/21/2017  . Lymphedema 05/26/2017  . Leg pain 04/26/2017  . Neuropathy 04/26/2017  . Chronic venous insufficiency 04/26/2017  . PAD (peripheral artery disease) ([place]) 04/26/2017  . Lumbar spondylosis with myelopathy 04/26/2017  . COPD (chronic obstructive pulmonary disease) ([place]) 03/31/2017  . GERD (gastroesophageal reflux disease) 03/31/2017  . Polyneuropathy associated with underlying disease ([place]) 03/04/2017  . Stage 3 chronic kidney disease 03/04/2017  . Acute respiratory failure with hypoxia ([place]) 07/19/2016  . Aortic atherosclerosis ([place]) 04/16/2016  . Claudication ([place]) 04/15/2016  . Recurrent major depressive disorder, in full remission ([place]) 10/10/2015  . Essential hypertension 04/11/2015  . Dysphagia 11/27/2014  . Edema of both legs 12/21/2013   PCP:  Ann Hector, MD Pharmacy:   [id number] [place] [place] [address] [id number]: [phone]: [phone]    Social Determinants of Health (SDOH) Interventions    Readmission Risk Interventions No flowsheet data found.

## 2020-03-19 NOTE — Plan of Care (Signed)

## 2020-03-20 MED ORDER — OXYCODONE HCL 5 MG PO TABS: 5.0000 mg | ORAL_TABLET | Freq: Four times a day (QID) | ORAL | 0 refills | Status: AC | PRN

## 2020-03-20 MED ORDER — CLINDAMYCIN HCL 300 MG PO CAPS
300.0000 mg | ORAL_CAPSULE | Freq: Three times a day (TID) | ORAL | 0 refills | Status: AC
Start: 2020-03-20 — End: 2020-03-28

## 2020-03-20 NOTE — Care Management Important Message (Signed)
Important Message  Patient Details  Name: SHANZAY HEPWORTH MRN: [id number] Date of Birth: November 27, 1940   Medicare Important Message Given:  Yes     Bernadette Hoit 03/20/2020, 11:03 AM

## 2020-03-20 NOTE — Evaluation (Signed)
Physical Therapy Evaluation Patient Details Name: Ann Benson MRN: [id number] DOB: 07/17/1941 Today's Date: 03/20/2020   History of Present Illness  Ann Benson is a 79 y.o. female with medical history significant for IBS, GERD, HTN, asthma who presented to the emergency room by EMS with a 3-day history of intractable nausea, vomiting, difficulty holding anything down as well as loose stool. PMH includes: COPD, IBS, PAD, HTN, CKD  Clinical Impression  Patient received in chair, agrees to PT assessment. Patient reports she is weak. Has not been out out of bed in a couple of days until this morning. Patient requires min guard for sit to stand, cues for safety. Ambulated in her room 60 feet with RW. Occasional min lob during ambulation requiring min assist to steady. She will continue to benefit from skilled PT to improve strength and functional independence.      Follow Up Recommendations Home health PT    Equipment Recommendations  None recommended by PT    Recommendations for Other Services       Precautions / Restrictions Precautions Precautions: Fall Restrictions Weight Bearing Restrictions: No      Mobility  Bed Mobility               General bed mobility comments: patient received up in recliner  Transfers Overall transfer level: Needs assistance Equipment used: Rolling walker (2 wheeled) Transfers: Sit to/from Stand Sit to Stand: Min guard            Ambulation/Gait Ambulation/Gait assistance: Min assist Gait Distance (Feet): 60 Feet Assistive device: Rolling walker (2 wheeled) Gait Pattern/deviations: Step-to pattern;Narrow base of support;Decreased stride length Gait velocity: decr   General Gait Details: Patient ambulated around her room with RW, occasional LOB requiring min assist to recover. Patient is limited by weakness.  Stairs            Wheelchair Mobility    Modified Rankin (Stroke Patients Only)       Balance  Overall balance assessment: Needs assistance Sitting-balance support: Feet supported Sitting balance-Leahy Scale: Good     Standing balance support: Bilateral upper extremity supported;During functional activity Standing balance-Leahy Scale: Fair Standing balance comment: good static standing balance, min lob with ambulation.                             Pertinent Vitals/Pain Pain Assessment: No/denies pain    Home Living Family/patient expects to be discharged to:: Private residence Living Arrangements: Spouse/significant other Available Help at Discharge: Family Type of Home: Apartment Home Access: Stairs to enter Entrance Stairs-Rails: Right;Can reach Technical sales engineer of Steps: 5 Home Layout: One level Home Equipment: Walker - 2 wheels;Grab bars - tub/shower      Prior Function Level of Independence: Independent         Comments: Patient was independent PTA. Drives.     Hand Dominance   Dominant Hand: Right    Extremity/Trunk Assessment   Upper Extremity Assessment Upper Extremity Assessment: Generalized weakness    Lower Extremity Assessment Lower Extremity Assessment: Generalized weakness    Cervical / Trunk Assessment Cervical / Trunk Assessment: Normal  Communication   Communication: No difficulties  Cognition Arousal/Alertness: Awake/alert Behavior During Therapy: WFL for tasks assessed/performed Overall Cognitive Status: Within Functional Limits for tasks assessed  General Comments      Exercises Other Exercises Other Exercises: seated LAQ, hip abd/add, AP x 8-10 reps each   Assessment/Plan    PT Assessment Patient needs continued PT services  PT Problem List Decreased strength;Decreased mobility;Decreased balance;Decreased activity tolerance       PT Treatment Interventions Therapeutic activities;Therapeutic exercise;Stair training;Functional mobility  training;Patient/family education;Gait training;Balance training    PT Goals (Current goals can be found in the Care Plan section)  Acute Rehab PT Goals Patient Stated Goal: to return home, improve strength PT Goal Formulation: With patient Time For Goal Achievement: 03/27/20 Potential to Achieve Goals: Good    Frequency Min 2X/week   Barriers to discharge Decreased caregiver support      Co-evaluation               AM-PAC PT "6 Clicks" Mobility  Outcome Measure Help needed turning from your back to your side while in a flat bed without using bedrails?: None Help needed moving from lying on your back to sitting on the side of a flat bed without using bedrails?: None Help needed moving to and from a bed to a chair (including a wheelchair)?: A Little Help needed standing up from a chair using your arms (e.g., wheelchair or bedside chair)?: A Little Help needed to walk in hospital room?: A Little Help needed climbing 3-5 steps with a railing? : A Little 6 Click Score: 20    End of Session Equipment Utilized During Treatment: Gait belt Activity Tolerance: Patient limited by lethargy Patient left: in chair;with call bell/phone within reach Nurse Communication: Mobility status PT Visit Diagnosis: Unsteadiness on feet (R26.81);Muscle weakness (generalized) (M62.81)    Time: 0930-1000 PT Time Calculation (min) (ACUTE ONLY): 30 min   Charges:   PT Evaluation $PT Eval Moderate Complexity: 1 Mod PT Treatments $Gait Training: 8-22 mins        Bawi Lakins, PT, GCS 03/20/20,10:42 AM

## 2020-03-20 NOTE — Discharge Summary (Signed)
Physician Discharge Summary  Pilar Platelizabeth W Hoehn ZOX:[id number] DOB: 1941-01-12 DOA: 03/15/2020  PCP: Lynnea FerrierKlein, Bert J III, MD  Admit date: 03/15/2020 Discharge date: 03/20/2020  Admitted From: Home Disposition: Home with home health  Recommendations for Outpatient Follow-up:  1. Follow up with PCP in 1-2 weeks 2. follow-up with surgery as a scheduled  Home Health: Home health PT and RN for wound care Equipment/Devices: None  Discharge Condition: Stable CODE STATUS: Full code Diet recommendation: Low-salt diet, frequent small meals and all GERD precautions as attached and instructions.  Discharge summary: 79 year old female with history of IBS, GERD, hiatal hernia, hypertension and asthma presented to the emergency room with 3 days history of intractable nausea and vomiting, epigastric discomfort.  In the emergency room she was hemodynamically stable.  CT abdomen pelvis showed no acute findings other than esophageal hernia.  She was found to have right breast abscess.  With significant symptoms she was admitted to the hospital and treated with IV antibiotics and IV fluids with stabilization.  superficial culture from right breast grew MRSA.  Patient underwent surgical incision drainage in the operating room on 8/9.  Postoperatively stable.  Nausea vomiting improved.  Plan: Adequately surgically stabilized.  Going home with wound care instructions and total 10 days of therapy with clindamycin.  Surgical follow-up in 2 weeks. Significant GERD symptoms with presence of hiatal hernia, scheduled for upper GI endoscopy in 2 weeks, Protonix twice a day that she will continue. Significantly debilitated and deconditioned with acute medical issues, will benefit with home health PT. She will need wound care at home, will benefit with a skilled nursing at home.  Discharge Diagnoses:  Principal Problem:   Acute gastroenteritis Active Problems:   COPD (chronic obstructive pulmonary disease) (HCC)   PAD  (peripheral artery disease) (HCC)   Essential hypertension   IBS (irritable bowel syndrome)   Stage 3 chronic kidney disease   Lump of right breast   Breast abscess of female   Cellulitis    Discharge Instructions  Discharge Instructions    Call MD for:  redness, tenderness, or signs of infection (pain, swelling, redness, odor or green/yellow discharge around incision site)   Complete by: As directed    Call MD for:  severe uncontrolled pain   Complete by: As directed    Call MD for:  temperature >100.4   Complete by: As directed    Diet - low sodium heart healthy   Complete by: As directed    Discharge wound care:   Complete by: As directed    As per surgical plan   Increase activity slowly   Complete by: As directed      Allergies as of 03/20/2020      Reactions   Aspirin Other (See Comments)   Epitaxis    Atorvastatin Other (See Comments)   Mirabegron Itching   Statins Other (See Comments)   Acetaminophen Palpitations      Medication List    STOP taking these medications   sulfamethoxazole-trimethoprim 800-160 MG tablet Commonly known as: BACTRIM DS     TAKE these medications   B-12 1000 MCG Caps Take by mouth daily.   Calcium Carbonate-Vitamin D 600-400 MG-UNIT tablet Take 1 tablet by mouth 2 (two) times daily with a meal.   clindamycin 300 MG capsule Commonly known as: CLEOCIN Take 1 capsule (300 mg total) by mouth 3 (three) times daily for 8 days.   DULoxetine 60 MG capsule Commonly known as: CYMBALTA duloxetine 60 mg capsule,delayed release  gabapentin 300 MG capsule Commonly known as: NEURONTIN 300 mg 2 (two) times daily. Take 1 capsule (300 mg) in the morning and 2 capsules (600 mg) in the evening.   oxybutynin 10 MG 24 hr tablet Commonly known as: DITROPAN-XL Take 1 tablet (10 mg total) by mouth daily.   oxyCODONE 5 MG immediate release tablet Commonly known as: Oxy IR/ROXICODONE Take 1 tablet (5 mg total) by mouth every 6 (six) hours as  needed for moderate pain or severe pain.   pantoprazole 40 MG tablet Commonly known as: PROTONIX Take 40 mg by mouth 2 (two) times daily before a meal.   prednisoLONE acetate 1 % ophthalmic suspension Commonly known as: PRED FORTE Place 1 drop into the left eye 3 (three) times daily.   rosuvastatin 5 MG tablet Commonly known as: CRESTOR Take 5 mg by mouth once a week.   temazepam 15 MG capsule Commonly known as: RESTORIL 30 mg at bedtime as needed for sleep.   triamterene-hydrochlorothiazide 37.5-25 MG tablet Commonly known as: MAXZIDE-25 Take 1 tablet by mouth daily.   Vitamin D3 50 MCG (2000 UT) capsule Take 6,000 Units by mouth daily.            Discharge Care Instructions  (From admission, onward)         Start     Ordered   03/20/20 0000  Discharge wound care:       Comments: As per surgical plan   03/20/20 1135          Follow-up Information    Piscoya, Elita Quick, MD. Schedule an appointment as soon as possible for a visit in 1 week(s).   Specialty: General Surgery Why: 1-2 week follow up, s/p right breast abscess I&D Contact information: [address] [place] [address] [phone]              Allergies  Allergen Reactions  . Aspirin Other (See Comments)    Epitaxis   . Atorvastatin Other (See Comments)  . Mirabegron Itching  . Statins Other (See Comments)  . Acetaminophen Palpitations    Consultations:  General surgery   Procedures/Studies: CT ABDOMEN PELVIS W CONTRAST  Result Date: 03/16/2020 CLINICAL DATA:  Nausea, vomiting, and diarrhea for a couple of days. Abdominal pain EXAM: CT ABDOMEN AND PELVIS WITH CONTRAST TECHNIQUE: Multidetector CT imaging of the abdomen and pelvis was performed using the standard protocol following bolus administration of intravenous contrast. CONTRAST:  OMNIPAQUE IOHEXOL 300 MG/ML  SOLN COMPARISON:  10/03/2018 FINDINGS: Lower chest: Mild dependent changes in the lung bases.  Moderate-sized esophageal hiatal hernia. Hepatobiliary: Diffuse fatty infiltration of the liver. No focal liver lesions. Surgical absence of the gallbladder. Bile ducts are not dilated. Pancreas: Unremarkable. No pancreatic ductal dilatation or surrounding inflammatory changes. Spleen: Normal in size without focal abnormality. Adrenals/Urinary Tract: Adrenal glands are unremarkable. Kidneys are normal, without renal calculi, focal lesion, or hydronephrosis. Bladder is unremarkable. Stomach/Bowel: Stomach, small bowel, and colon are not abnormally distended. Diffuse diverticulosis of the colon. No inflammatory changes. Appendix is not identified. Vascular/Lymphatic: Aortic atherosclerosis. No enlarged abdominal or pelvic lymph nodes. Reproductive: The uterus is surgically absent. No abnormal adnexal masses. 1.6 cm diameter cystic structure in the vulvar region is unchanged since prior study. Possibly a Bartholin's gland cyst. Other: No abdominal wall hernia or abnormality. No abdominopelvic ascites. Musculoskeletal: Compression deformity post kyphoplasty involving L1. No destructive bone lesions. IMPRESSION: 1. No acute process demonstrated in the abdomen or pelvis. No evidence of bowel obstruction  or inflammation. 2. Diffuse fatty infiltration of the liver. 3. Moderate-sized esophageal hiatal hernia. 4. Diffuse diverticulosis of the colon without evidence of diverticulitis. 5. 1.6 cm diameter cystic structure in the vulvar region is unchanged since prior study. Possibly a Bartholin's gland cyst. Aortic atherosclerosis. Aortic Atherosclerosis (ICD10-I70.0). Electronically Signed   By: Burman Nieves M.D.   On: 03/16/2020 00:28   [place] CHEST SOFT TISSUE  Result Date: 03/16/2020 CLINICAL DATA:  Right breast lump.  Concern for abscess. EXAM: ULTRASOUND OF HEAD/NECK SOFT TISSUES TECHNIQUE: Ultrasound examination of the right breast soft tissues was performed in the area of clinical concern. COMPARISON:  None. FINDINGS:  Limited ultrasound images were obtained of the right breast lump. Corresponding to the palpable abnormality, there is a soft tissue mass with internal vascularity measuring 2.9 x 0.7 x 3.3 cm. No loculated collection is identified. IMPRESSION: Soft tissue mass corresponding to palpable abnormality in the right breast. No loculated collection is identified. Recommend referral to breast clinic for diagnostic ultrasound and/or mammography to exclude neoplasm. Electronically Signed   By: Burman Nieves M.D.   On: 03/16/2020 02:08    (Echo, Carotid, EGD, Colonoscopy, ERCP)    Subjective: Patient was seen and examined.  No overnight events.  No more nausea or vomiting.  Was able to eat well.  Still has some soreness on the right breast however denies any major pain.  Afebrile.  Felt too weak to mobilize, she thinks she will be able to go home safely and wants to do further therapy at home.   Discharge Exam: Vitals:   03/20/20 0358 03/20/20 0755  BP: (!) 121/58 129/64  Pulse: 67 61  Resp: 16 16  Temp: 97.8 F (36.6 C) 97.8 F (36.6 C)  SpO2: 99% 98%   Vitals:   03/19/20 2000 03/19/20 2325 03/20/20 0358 03/20/20 0755  BP: (!) 144/57 (!) 108/48 (!) 121/58 129/64  Pulse: 70 63 67 61  Resp: Temp: 98 F (36.7 C) 98 F (36.7 C) 97.8 F (36.6 C) 97.8 F (36.6 C)  TempSrc: Oral Oral Oral Oral  SpO2: 98% 99% 99% 98%  Weight:      Height:        General: Pt is alert, awake, not in acute distress Cardiovascular: RRR, S1/S2 +, no rubs, no gallops Respiratory: CTA bilaterally, no wheezing, no rhonchi Abdominal: Soft, NT, ND, bowel sounds + Extremities: no edema, no cyanosis Right breast, has a surgical dressing with packing of the wound with minimal tenderness surrounding the incision, no fluctuation.    The results of significant diagnostics from this hospitalization (including imaging, microbiology, ancillary and laboratory) are listed below for reference.      Microbiology: Recent Results (from the past 240 hour(s))  Wound or Superficial Culture     Status: None   Collection Time: 03/15/20 10:10 PM   Specimen: Abscess; Wound  Result Value Ref Range Status   Specimen Description   Final    ABSCESS Performed at [facility], [address] [place], [place] [address]    Special Requests   Final    NONE Performed at [facility], [address] [place], [place] [address]    Gram Stain   Final    NO WBC SEEN RARE GRAM POSITIVE COCCI IN PAIRS Performed at [facility], [address] [place], [place] [address]    Culture   Final    ABUNDANT METHICILLIN RESISTANT STAPHYLOCOCCUS AUREUS   Report Status 03/18/2020  FINAL  Final   Organism ID, Bacteria METHICILLIN RESISTANT STAPHYLOCOCCUS AUREUS  Final      Susceptibility   Methicillin resistant staphylococcus aureus - MIC*    CIPROFLOXACIN >=8 RESISTANT Resistant     ERYTHROMYCIN 0.5 SENSITIVE Sensitive     GENTAMICIN <=0.5 SENSITIVE Sensitive     OXACILLIN >=4 RESISTANT Resistant     TETRACYCLINE <=1 SENSITIVE Sensitive     VANCOMYCIN <=0.5 SENSITIVE Sensitive     TRIMETH/SULFA <=10 SENSITIVE Sensitive     CLINDAMYCIN <=0.25 SENSITIVE Sensitive     RIFAMPIN <=0.5 SENSITIVE Sensitive     Inducible Clindamycin NEGATIVE Sensitive     * ABUNDANT METHICILLIN RESISTANT STAPHYLOCOCCUS AUREUS  SARS Coronavirus 2 by RT PCR (hospital order, performed in [facility] hospital lab) Nasopharyngeal Nasopharyngeal Swab     Status: None   Collection Time: 03/15/20 11:53 PM   Specimen: Nasopharyngeal Swab  Result Value Ref Range Status   SARS Coronavirus 2 NEGATIVE NEGATIVE Final    Comment: (NOTE) SARS-CoV-2 target nucleic acids are NOT DETECTED.  The SARS-CoV-2 RNA is generally detectable in upper and lower respiratory specimens during the acute phase of infection. The lowest concentration of SARS-CoV-2 viral copies this assay can detect is 250 copies / mL. A  negative result does not preclude SARS-CoV-2 infection and should not be used as the sole basis for treatment or other patient management decisions.  A negative result may occur with improper specimen collection / handling, submission of specimen other than nasopharyngeal swab, presence of viral mutation(s) within the areas targeted by this assay, and inadequate number of viral copies (<250 copies / mL). A negative result must be combined with clinical observations, patient history, and epidemiological information.  Fact Sheet for Patients:   BoilerBrush.com.cy  Fact Sheet for Healthcare Providers: https://pope.com/  This test is not yet approved or  cleared by the [place] FDA and has been authorized for detection and/or diagnosis of SARS-CoV-2 by FDA under an Emergency Use Authorization (EUA).  This EUA will remain in effect (meaning this test can be used) for the duration of the COVID-19 declaration under Section 564(b)(1) of the Act, 21 U.S.C. section 360bbb-3(b)(1), unless the authorization is terminated or revoked sooner.  Performed at [facility], [address] [place], [place] [address]   Culture, blood (routine x 2)     Status: None (Preliminary result)   Collection Time: 03/16/20  1:04 AM   Specimen: BLOOD  Result Value Ref Range Status   Specimen Description BLOOD LEFT ANTECUBITAL  Final   Special Requests   Final    BOTTLES DRAWN AEROBIC AND ANAEROBIC Blood Culture adequate volume   Culture   Final    NO GROWTH 4 DAYS Performed at [facility], [address], [place], [place] [address]    Report Status PENDING  Incomplete  Culture, blood (routine x 2)     Status: None (Preliminary result)   Collection Time: 03/16/20  1:04 AM   Specimen: BLOOD  Result Value Ref Range Status   Specimen Description BLOOD RIGHT ANTECUBITAL  Final   Special Requests   Final    BOTTLES DRAWN AEROBIC AND  ANAEROBIC Blood Culture results may not be optimal due to an inadequate volume of blood received in culture bottles   Culture   Final    NO GROWTH 4 DAYS Performed at [facility], [address] [place], [place] [address]    Report Status PENDING  Incomplete  MRSA PCR Screening  Status: None   Collection Time: 03/19/20  5:10 PM   Specimen: Nasopharyngeal  Result Value Ref Range Status   MRSA by PCR NEGATIVE NEGATIVE Final    Comment:        The GeneXpert MRSA Assay (FDA approved for NASAL specimens only), is one component of a comprehensive MRSA colonization surveillance program. It is not intended to diagnose MRSA infection nor to guide or monitor treatment for MRSA infections. Performed at [facility], [address] [place], [place] [address]      Labs: BNP (last 3 results) No results for input(s): BNP in the last 8760 hours. Basic Metabolic Panel: Recent Labs  Lab 03/15/20 1141 03/16/20 0319 03/17/20 0859 03/19/20 0445  NA 142  --  139 137  K 4.0  --  3.8 4.9  CL 109  --  108 106  CO2 24  --  23 22  GLUCOSE 109*  --  93 122*  BUN 11  --  8 15  CREATININE 0.99 0.92 0.86 0.92  CALCIUM 9.5  --  8.8* 9.8  MG  --   --  2.1 2.3   Liver Function Tests: Recent Labs  Lab 03/15/20 1141 03/17/20 0859  AST 22 23  ALT 18 17  ALKPHOS 51 45  BILITOT 1.1 1.1  PROT 7.3 6.7  ALBUMIN 3.7 3.5   Recent Labs  Lab 03/15/20 1141 03/17/20 0859  LIPASE 19 24   No results for input(s): AMMONIA in the last 168 hours. CBC: Recent Labs  Lab 03/15/20 1141 03/16/20 0319 03/18/20 0409 03/19/20 0445  WBC 7.4 9.2 11.0* 7.7  NEUTROABS  --   --   --  5.9  HGB 15.3* 15.6* 16.0* 15.0  HCT 44.8 44.7 49.1* 43.4  MCV 87.8 88.0 90.1 87.7  PLT 170 180 172 188   Cardiac Enzymes: No results for input(s): CKTOTAL, CKMB, CKMBINDEX, TROPONINI in the last 168 hours. BNP: Invalid input(s): POCBNP CBG: No results for input(s): GLUCAP in the last 168  hours. D-Dimer No results for input(s): DDIMER in the last 72 hours. Hgb A1c No results for input(s): HGBA1C in the last 72 hours. Lipid Profile No results for input(s): CHOL, HDL, LDLCALC, TRIG, CHOLHDL, LDLDIRECT in the last 72 hours. Thyroid function studies No results for input(s): TSH, T4TOTAL, T3FREE, THYROIDAB in the last 72 hours.  Invalid input(s): FREET3 Anemia work up No results for input(s): VITAMINB12, FOLATE, FERRITIN, TIBC, IRON, RETICCTPCT in the last 72 hours. Urinalysis    Component Value Date/Time   COLORURINE YELLOW (A) 03/15/2020 1141   APPEARANCEUR CLOUDY (A) 03/15/2020 1141   APPEARANCEUR Cloudy (A) 09/12/2018 1432   LABSPEC 1.010 03/15/2020 1141   PHURINE 7.0 03/15/2020 1141   GLUCOSEU NEGATIVE 03/15/2020 1141   HGBUR NEGATIVE 03/15/2020 1141   BILIRUBINUR NEGATIVE 03/15/2020 1141   BILIRUBINUR Negative 09/12/2018 1432   KETONESUR 5 (A) 03/15/2020 1141   PROTEINUR NEGATIVE 03/15/2020 1141   NITRITE NEGATIVE 03/15/2020 1141   LEUKOCYTESUR NEGATIVE 03/15/2020 1141   Sepsis Labs Invalid input(s): PROCALCITONIN,  WBC,  LACTICIDVEN Microbiology Recent Results (from the past 240 hour(s))  Wound or Superficial Culture     Status: None   Collection Time: 03/15/20 10:10 PM   Specimen: Abscess; Wound  Result Value Ref Range Status   Specimen Description   Final    ABSCESS Performed at [facility], [address], [place], [place] [address]    Special Requests   Final    NONE Performed at [facility], [address] [place], [place] [address]    Gram Stain   Final    NO WBC SEEN RARE GRAM POSITIVE COCCI IN PAIRS Performed at [facility], [address], [place], [place] [address]    Culture   Final    ABUNDANT METHICILLIN RESISTANT STAPHYLOCOCCUS AUREUS   Report Status 03/18/2020 FINAL  Final   Organism ID, Bacteria METHICILLIN RESISTANT STAPHYLOCOCCUS AUREUS  Final      Susceptibility   Methicillin resistant  staphylococcus aureus - MIC*    CIPROFLOXACIN >=8 RESISTANT Resistant     ERYTHROMYCIN 0.5 SENSITIVE Sensitive     GENTAMICIN <=0.5 SENSITIVE Sensitive     OXACILLIN >=4 RESISTANT Resistant     TETRACYCLINE <=1 SENSITIVE Sensitive     VANCOMYCIN <=0.5 SENSITIVE Sensitive     TRIMETH/SULFA <=10 SENSITIVE Sensitive     CLINDAMYCIN <=0.25 SENSITIVE Sensitive     RIFAMPIN <=0.5 SENSITIVE Sensitive     Inducible Clindamycin NEGATIVE Sensitive     * ABUNDANT METHICILLIN RESISTANT STAPHYLOCOCCUS AUREUS  SARS Coronavirus 2 by RT PCR (hospital order, performed in [facility] hospital lab) Nasopharyngeal Nasopharyngeal Swab     Status: None   Collection Time: 03/15/20 11:53 PM   Specimen: Nasopharyngeal Swab  Result Value Ref Range Status   SARS Coronavirus 2 NEGATIVE NEGATIVE Final    Comment: (NOTE) SARS-CoV-2 target nucleic acids are NOT DETECTED.  The SARS-CoV-2 RNA is generally detectable in upper and lower respiratory specimens during the acute phase of infection. The lowest concentration of SARS-CoV-2 viral copies this assay can detect is 250 copies / mL. A negative result does not preclude SARS-CoV-2 infection and should not be used as the sole basis for treatment or other patient management decisions.  A negative result may occur with improper specimen collection / handling, submission of specimen other than nasopharyngeal swab, presence of viral mutation(s) within the areas targeted by this assay, and inadequate number of viral copies (<250 copies / mL). A negative result must be combined with clinical observations, patient history, and epidemiological information.  Fact Sheet for Patients:   BoilerBrush.com.cy  Fact Sheet for Healthcare Providers: https://pope.com/  This test is not yet approved or  cleared by the [place] FDA and has been authorized for detection and/or diagnosis of SARS-CoV-2 by FDA under an Emergency Use  Authorization (EUA).  This EUA will remain in effect (meaning this test can be used) for the duration of the COVID-19 declaration under Section 564(b)(1) of the Act, 21 U.S.C. section 360bbb-3(b)(1), unless the authorization is terminated or revoked sooner.  Performed at [facility], [address] [place], [place] [address]   Culture, blood (routine x 2)     Status: None (Preliminary result)   Collection Time: 03/16/20  1:04 AM   Specimen: BLOOD  Result Value Ref Range Status   Specimen Description BLOOD LEFT ANTECUBITAL  Final   Special Requests   Final    BOTTLES DRAWN AEROBIC AND ANAEROBIC Blood Culture adequate volume   Culture   Final    NO GROWTH 4 DAYS Performed at [facility], [address] [place], [place] [address]    Report Status PENDING  Incomplete  Culture, blood (routine x 2)     Status: None (Preliminary result)   Collection Time: 03/16/20  1:04 AM   Specimen: BLOOD  Result Value Ref Range Status   Specimen Description BLOOD RIGHT ANTECUBITAL  Final   Special Requests   Final    BOTTLES  DRAWN AEROBIC AND ANAEROBIC Blood Culture results may not be optimal due to an inadequate volume of blood received in culture bottles   Culture   Final    NO GROWTH 4 DAYS Performed at [facility], [address] [place], [place] [address]    Report Status PENDING  Incomplete  MRSA PCR Screening     Status: None   Collection Time: 03/19/20  5:10 PM   Specimen: Nasopharyngeal  Result Value Ref Range Status   MRSA by PCR NEGATIVE NEGATIVE Final    Comment:        The GeneXpert MRSA Assay (FDA approved for NASAL specimens only), is one component of a comprehensive MRSA colonization surveillance program. It is not intended to diagnose MRSA infection nor to guide or monitor treatment for MRSA infections. Performed at [facility], [address] [place], [place] [address]      Time coordinating discharge:  35  minutes  SIGNED:   Dorcas Carrow, MD  [facility] 03/20/2020, 11:38 AM

## 2020-03-20 NOTE — Care Management Important Message (Signed)
Important Message  Patient Details  Name: CATHELEEN LANGHORNE MRN: [id number] Date of Birth: 28-Oct-1940   Medicare Important Message Given:  Yes     Lucy Chris, LCSW 03/20/2020, 10:04 AM

## 2020-03-20 NOTE — TOC Transition Note (Signed)
Transition of Care [facility]) - CM/SW Discharge Note   Patient Details  Name: Ann Benson MRN: [id number] Date of Birth: 09-21-1940  Transition of [facility]) CM/SW Contact:  Lucy Chris, LCSW Phone Number: 03/20/2020, 12:11 PM   Clinical Narrative:   Pt doing better and ready to go home today. AHH to follow up with [facility] and PT, aware will call once home to set up visits. Pt has all equipment needed for home from past surgeries. Husband and step-daughter will assist with her care but she is moving well with her rw. She and family feel prepared for DC today. No further follow due to DC today.    Final next level of care: Home w Home Health Services Barriers to Discharge: Barriers Resolved   Patient Goals and CMS Choice Patient states their goals for this hospitalization and ongoing recovery are:: I need to move around I have been in a bed for two days CMS Medicare.gov Compare Post Acute Care list provided to:: Patient Choice offered to / list presented to : Patient  Discharge Placement                Patient to be transferred to facility by: Husband Name of family member notified: Carl-husband Patient and family notified of of transfer: 03/20/20  Discharge Plan and Services In-house Referral: Clinical Social Work   Post Acute Care Choice: Home Health                    HH Arranged: RN, PT [facility] Agency: [facility] Date HH Agency Contacted: 03/19/20 Time HH Agency Contacted: 1110 Representative spoke with at [facility] Agency: Barbara Cower  Social Determinants of Health (SDOH) Interventions     Readmission Risk Interventions No flowsheet data found.

## 2020-03-20 NOTE — Progress Notes (Signed)
Pt IV access lost. RN notified Attending MD. Per MD pt good to discharge, just pick up PO abx. Tech in pt's room helping clean her up. RN to come back shortly with discharge paper work.

## 2020-03-20 NOTE — Discharge Instructions (Signed)
Dressing change:  Apply moist gauze into the wound cavity.  Cover with dry gauze and secure with tape.  Change once daily.

## 2020-03-20 NOTE — Progress Notes (Signed)
Pt sitting in bed. Pt denies pain. Scheduled meds given. IV fluids stopped. Pt up to the chair with minimal assistance using front wheel walker. Pt denies any further needs at this time. Call bell within reach, RN number on board

## 2020-03-20 NOTE — Progress Notes (Signed)
[facility] SURGICAL PROGRESS NOTE  Hospital Day(s): 3.   Post op day(s): 2 Days Post-Op.   Interval History:  Patient seen and examined no acute events or new complaints overnight.  Patient reports she is feeling better this morning, pain in her right breast is improved No fever, chills, nausea, emesis No new labs or imaging Wound Cx from 08/06 grew out MRSA She continues on clindamycin  Vital signs in last 24 hours: [min-max] current  Temp:  [97.6 F (36.4 C)-98.5 F (36.9 C)] 97.8 F (36.6 C) (08/11 0358) Pulse Rate:  [58-75] 67 (08/11 0358) Resp:  [16-18] 16 (08/11 0358) BP: (108-164)/(48-76) 121/58 (08/11 0358) SpO2:  [88 %-100 %] 99 % (08/11 0358)     Height: 5\' 4"  (162.6 cm) Weight: 79.8 kg BMI (Calculated): 30.18   Intake/Output last 2 shifts:  08/10 0701 - 08/11 0700 In: -  Out: 300 [Urine:300]   Physical Exam:  Constitutional: alert, cooperative and no distress  Respiratory: breathing non-labored at rest  Cardiovascular: regular rate and sinus rhythm  Integumentary: I&D site to the 7 o'clock position on the right breast, erythematous but improved, indurated but no fluctuance. Packing in place, no purulent drainage  Labs:  CBC Latest Ref Rng & Units 03/19/2020 03/18/2020 03/16/2020  WBC 4.0 - 10.5 K/uL 7.7 11.0(H) 9.2  Hemoglobin 12.0 - 15.0 g/dL 05/16/2020 16.0(H) 15.6(H)  Hematocrit 36 - 46 % 43.4 49.1(H) 44.7  Platelets 150 - 400 K/uL 188 172 180   CMP Latest Ref Rng & Units 03/19/2020 03/17/2020 03/16/2020  Glucose 70 - 99 mg/dL 05/16/2020) 93 -  BUN 8 - 23 mg/dL 15 8 -  Creatinine 093(O - 1.00 mg/dL 6.71 2.45 8.09  Sodium 135 - 145 mmol/L 137 139 -  Potassium 3.5 - 5.1 mmol/L 4.9 3.8 -  Chloride 98 - 111 mmol/L 106 108 -  CO2 22 - 32 mmol/L 22 23 -  Calcium 8.9 - 10.3 mg/dL 9.8 9.83) -  Total Protein 6.5 - 8.1 g/dL - 6.7 -  Total Bilirubin 0.3 - 1.2 mg/dL - 1.1 -  Alkaline Phos 38 - 126 U/L - 45 -  AST 15 - 41 U/L - 23 -  ALT 0 - 44 U/L - 17 -     Imaging studies: No new pertinent imaging studies   Assessment/Plan: 79 y.o. female 2 Days Post-Op s/p I&D of right breast abscess   - Continue IV ABx (clindamycin) in house: transition to PO at discharge complete ~10 days total              - Will provide wound care instructions; review packing with patient  - Home health ordered; TOC team following             - Pain control prn                          - Discharge Planning: Okay for discharge from surgical standpoint, ABx as above, follow up in 1-2 weeks with Dr 94.    All of the above findings and recommendations were discussed with the patient, and the medical team, and all of patient's questions were answered to her expressed satisfaction.  -- Aleen Campi, PA-C [facility] 03/20/2020, 7:19 AM [phone] M-F: 7am - 4pm

## 2020-03-21 LAB — CULTURE, BLOOD (ROUTINE X 2)
Culture: NO GROWTH
Culture: NO GROWTH
Special Requests: ADEQUATE

## 2020-03-27 ENCOUNTER — Other Ambulatory Visit: Payer: Self-pay

## 2020-03-27 ENCOUNTER — Ambulatory Visit (INDEPENDENT_AMBULATORY_CARE_PROVIDER_SITE_OTHER): Payer: Medicare Other | Admitting: Surgery

## 2020-03-27 ENCOUNTER — Encounter: Payer: Self-pay | Admitting: Surgery

## 2020-03-27 VITALS — BP 125/78 | HR 92 | Temp 98.3°F | Ht 64.0 in | Wt 177.0 lb

## 2020-03-27 DIAGNOSIS — N611 Abscess of the breast and nipple: Secondary | ICD-10-CM

## 2020-03-27 DIAGNOSIS — Z09 Encounter for follow-up examination after completed treatment for conditions other than malignant neoplasm: Secondary | ICD-10-CM

## 2020-03-27 NOTE — Patient Instructions (Addendum)
Patient advised to continue with her at home dressing changes to help with healing, patient was advised she no longer need to pack the wound. Patient was advised that she may resume with showering and allow the warm, soapy water to run over the wound and pat dry before applying the yellow gauze and dry gauze to the area.  Patient was advised to contact her Primary Care Provider, if she continue to notice dark stools. Wound Care, Adult Taking care of your wound properly can help to prevent pain, infection, and scarring. It can also help your wound to heal more quickly. How to care for your wound Wound care      Follow instructions from your health care provider about how to take care of your wound. Make sure you: ? Wash your hands with soap and water before you change the bandage (dressing). If soap and water are not available, use hand sanitizer. ? Change your dressing as told by your health care provider. ? Leave stitches (sutures), skin glue, or adhesive strips in place. These skin closures may need to stay in place for 2 weeks or longer. If adhesive strip edges start to loosen and curl up, you may trim the loose edges. Do not remove adhesive strips completely unless your health care provider tells you to do that.  Check your wound area every day for signs of infection. Check for: ? Redness, swelling, or pain. ? Fluid or blood. ? Warmth. ? Pus or a bad smell.  Ask your health care provider if you should clean the wound with mild soap and water. Doing this may include: ? Using a clean towel to pat the wound dry after cleaning it. Do not rub or scrub the wound. ? Applying a cream or ointment. Do this only as told by your health care provider. ? Covering the incision with a clean dressing.  Ask your health care provider when you can leave the wound uncovered.  Keep the dressing dry until your health care provider says it can be removed. Do not take baths, swim, use a hot tub, or do  anything that would put the wound underwater until your health care provider approves. Ask your health care provider if you can take showers. You may only be allowed to take sponge baths. Medicines   If you were prescribed an antibiotic medicine, cream, or ointment, take or use the antibiotic as told by your health care provider. Do not stop taking or using the antibiotic even if your condition improves.  Take over-the-counter and prescription medicines only as told by your health care provider. If you were prescribed pain medicine, take it 30 or more minutes before you do any wound care or as told by your health care provider. General instructions  Return to your normal activities as told by your health care provider. Ask your health care provider what activities are safe.  Do not scratch or pick at the wound.  Do not use any products that contain nicotine or tobacco, such as cigarettes and e-cigarettes. These may delay wound healing. If you need help quitting, ask your health care provider.  Keep all follow-up visits as told by your health care provider. This is important.  Eat a diet that includes protein, vitamin A, vitamin C, and other nutrient-rich foods to help the wound heal. ? Foods rich in protein include meat, dairy, beans, nuts, and other sources. ? Foods rich in vitamin A include carrots and dark green, leafy vegetables. ? Foods rich in  vitamin C include citrus, tomatoes, and other fruits and vegetables. ? Nutrient-rich foods have protein, carbohydrates, fat, vitamins, or minerals. Eat a variety of healthy foods including vegetables, fruits, and whole grains. Contact a health care provider if:  You received a tetanus shot and you have swelling, severe pain, redness, or bleeding at the injection site.  Your pain is not controlled with medicine.  You have redness, swelling, or pain around the wound.  You have fluid or blood coming from the wound.  Your wound feels warm to  the touch.  You have pus or a bad smell coming from the wound.  You have a fever or chills.  You are nauseous or you vomit.  You are dizzy. Get help right away if:  You have a red streak going away from your wound.  The edges of the wound open up and separate.  Your wound is bleeding, and the bleeding does not stop with gentle pressure.  You have a rash.  You faint.  You have trouble breathing. Summary  Always wash your hands with soap and water before changing your bandage (dressing).  To help with healing, eat foods that are rich in protein, vitamin A, vitamin C, and other nutrients.  Check your wound every day for signs of infection. Contact your health care provider if you suspect that your wound is infected. This information is not intended to replace advice given to you by your health care provider. Make sure you discuss any questions you have with your health care provider. Document Revised: 11/14/2018 Document Reviewed: 02/11/2016 Elsevier Patient Education  2020 [employer].

## 2020-03-27 NOTE — Progress Notes (Signed)
03/27/2020  HPI: Ann Benson is a 79 y.o. female s/p I&D of right breast abscess on 8/9.  Cultures had grown MRSA and was discharged on Clindamycin.  She presents today for follow up.  She reports the soreness has improved and the wound is healing.  She's doing dressing changes daily as instructed.  Vital signs: BP 125/78   Pulse 92   Temp 98.3 F (36.8 C) (Oral)   Ht 5\' 4"  (1.626 m)   Wt 177 lb (80.3 kg)   SpO2 93%   BMI 30.38 kg/m    Physical Exam: Constitutional:  No acute distress Breast:  Right breast s/p I&D, with wound bed showing very healthy granulation tissue. No necrosis or purulent drainage.  Wound is shallow now.  Wound dressed with moist 2x2 gauze and covered with dry gauze and tape.  Assessment/Plan: This is a 79 y.o. female s/p I&D of right breast abscess.  --Patient is healing well and no debridement or procedures are needed at this point. --Continue dressing daily -- can use the iodoform gauze and just place a small piece over the wound bed, then cover with dry gauze and tape.  The wound is shallow now so no space to pack. --Follow up in 2 weeks for wound check   94, MD  [facility]

## 2020-04-09 DIAGNOSIS — K5732 Diverticulitis of large intestine without perforation or abscess without bleeding: Secondary | ICD-10-CM | POA: Insufficient documentation

## 2020-04-09 DIAGNOSIS — J189 Pneumonia, unspecified organism: Secondary | ICD-10-CM | POA: Insufficient documentation

## 2020-04-12 ENCOUNTER — Encounter: Payer: Self-pay | Admitting: Surgery

## 2020-04-17 DIAGNOSIS — N390 Urinary tract infection, site not specified: Secondary | ICD-10-CM | POA: Insufficient documentation

## 2020-07-05 ENCOUNTER — Other Ambulatory Visit
Admission: RE | Admit: 2020-07-05 | Discharge: 2020-07-05 | Disposition: A | Discharge status: Home / Self Care | Payer: Medicare Other | Source: Ambulatory Visit | Attending: Internal Medicine | Admitting: Internal Medicine

## 2020-07-05 ENCOUNTER — Other Ambulatory Visit: Payer: Self-pay

## 2020-07-05 DIAGNOSIS — Z01812 Encounter for preprocedural laboratory examination: Secondary | ICD-10-CM | POA: Diagnosis present

## 2020-07-05 DIAGNOSIS — Z20822 Contact with and (suspected) exposure to covid-19: Secondary | ICD-10-CM | POA: Diagnosis not present

## 2020-07-05 LAB — SARS CORONAVIRUS 2 (TAT 6-24 HRS): SARS Coronavirus 2: NEGATIVE

## 2020-07-08 ENCOUNTER — Encounter
Admission: RE | Disposition: A | Discharge status: Home / Self Care | Payer: Self-pay | Source: Ambulatory Visit | Attending: Internal Medicine

## 2020-07-08 ENCOUNTER — Ambulatory Visit: Payer: Medicare Other | Admitting: Certified Registered Nurse Anesthetist

## 2020-07-08 ENCOUNTER — Other Ambulatory Visit: Payer: Self-pay

## 2020-07-08 ENCOUNTER — Ambulatory Visit
Admission: RE | Admit: 2020-07-08 | Discharge: 2020-07-08 | Disposition: A | Discharge status: Home / Self Care | Payer: Medicare Other | Source: Ambulatory Visit | Attending: Internal Medicine | Admitting: Internal Medicine

## 2020-07-08 ENCOUNTER — Encounter: Payer: Self-pay | Admitting: Internal Medicine

## 2020-07-08 DIAGNOSIS — R933 Abnormal findings on diagnostic imaging of other parts of digestive tract: Secondary | ICD-10-CM | POA: Insufficient documentation

## 2020-07-08 DIAGNOSIS — Z6830 Body mass index (BMI) 30.0-30.9, adult: Secondary | ICD-10-CM | POA: Insufficient documentation

## 2020-07-08 DIAGNOSIS — Z79899 Other long term (current) drug therapy: Secondary | ICD-10-CM | POA: Diagnosis not present

## 2020-07-08 DIAGNOSIS — K5730 Diverticulosis of large intestine without perforation or abscess without bleeding: Secondary | ICD-10-CM | POA: Diagnosis not present

## 2020-07-08 DIAGNOSIS — D649 Anemia, unspecified: Secondary | ICD-10-CM | POA: Insufficient documentation

## 2020-07-08 DIAGNOSIS — K449 Diaphragmatic hernia without obstruction or gangrene: Secondary | ICD-10-CM | POA: Insufficient documentation

## 2020-07-08 DIAGNOSIS — R1310 Dysphagia, unspecified: Secondary | ICD-10-CM | POA: Diagnosis not present

## 2020-07-08 DIAGNOSIS — K6389 Other specified diseases of intestine: Secondary | ICD-10-CM | POA: Insufficient documentation

## 2020-07-08 DIAGNOSIS — D509 Iron deficiency anemia, unspecified: Secondary | ICD-10-CM | POA: Insufficient documentation

## 2020-07-08 DIAGNOSIS — K2970 Gastritis, unspecified, without bleeding: Secondary | ICD-10-CM | POA: Diagnosis not present

## 2020-07-08 DIAGNOSIS — K640 First degree hemorrhoids: Secondary | ICD-10-CM | POA: Diagnosis not present

## 2020-07-08 DIAGNOSIS — R634 Abnormal weight loss: Secondary | ICD-10-CM | POA: Diagnosis not present

## 2020-07-08 DIAGNOSIS — K5732 Diverticulitis of large intestine without perforation or abscess without bleeding: Secondary | ICD-10-CM | POA: Insufficient documentation

## 2020-07-08 DIAGNOSIS — K219 Gastro-esophageal reflux disease without esophagitis: Secondary | ICD-10-CM | POA: Diagnosis not present

## 2020-07-08 DIAGNOSIS — R194 Change in bowel habit: Secondary | ICD-10-CM | POA: Insufficient documentation

## 2020-07-08 DIAGNOSIS — K222 Esophageal obstruction: Secondary | ICD-10-CM | POA: Diagnosis not present

## 2020-07-08 HISTORY — PX: ESOPHAGOGASTRODUODENOSCOPY: SHX5428

## 2020-07-08 HISTORY — PX: COLONOSCOPY: SHX5424

## 2020-07-08 SURGERY — EGD (ESOPHAGOGASTRODUODENOSCOPY)
Anesthesia: General

## 2020-07-08 MED ORDER — GLYCOPYRROLATE 0.2 MG/ML IJ SOLN
INTRAMUSCULAR | Status: AC
  Filled 2020-07-08: qty 1

## 2020-07-08 MED ORDER — PROPOFOL 10 MG/ML IV BOLUS
INTRAVENOUS | Status: DC | PRN
  Administered 2020-07-08: 20 mg via INTRAVENOUS
  Administered 2020-07-08: 10 mg via INTRAVENOUS
  Administered 2020-07-08: 30 mg via INTRAVENOUS
  Administered 2020-07-08: 20 mg via INTRAVENOUS
  Administered 2020-07-08: 40 mg via INTRAVENOUS

## 2020-07-08 MED ORDER — GLYCOPYRROLATE 0.2 MG/ML IJ SOLN
INTRAMUSCULAR | Status: DC | PRN
  Administered 2020-07-08: .1 mg via INTRAVENOUS

## 2020-07-08 MED ORDER — PROPOFOL 500 MG/50ML IV EMUL
INTRAVENOUS | Status: DC | PRN
  Administered 2020-07-08: 150 ug/kg/min via INTRAVENOUS

## 2020-07-08 MED ORDER — PHENYLEPHRINE HCL (PRESSORS) 10 MG/ML IV SOLN
INTRAVENOUS | Status: DC | PRN
  Administered 2020-07-08: 100 ug via INTRAVENOUS

## 2020-07-08 MED ORDER — SODIUM CHLORIDE 0.9 % IV SOLN: INTRAVENOUS | Status: DC

## 2020-07-08 MED ORDER — LIDOCAINE HCL (CARDIAC) PF 100 MG/5ML IV SOSY
PREFILLED_SYRINGE | INTRAVENOUS | Status: DC | PRN
  Administered 2020-07-08: 80 mg via INTRAVENOUS

## 2020-07-08 NOTE — H&P (Signed)
Outpatient short stay form Pre-procedure 07/08/2020 1:10 PM Ann Benson K. Norma Fredrickson, M.D.  Primary Physician: Daniel Nones III, M.D.  Reason for visit:  Refractory GERD, Dysphagia, Esophageal dysmotility, abnormal barium swallow result, Schatzki Ring.   History of present illness: AS above. Patient on Protonix 40mg  po bid without much relief from GERD symptoms. No anorexia, GI bleeding, abdominal pain. Had acute uncomplicated diverticulitis for which she was admitted to [facility] In Sep 2021.      Current Facility-Administered Medications:  .  0.9 %  sodium chloride infusion, , Intravenous, Continuous, Angeliah Wisdom, Oct 2021, MD  Medications Prior to Admission  Medication Sig Dispense Refill Last Dose  . Calcium Carbonate-Vitamin D 600-400 MG-UNIT tablet Take 1 tablet by mouth 2 (two) times daily with a meal.    Past Month at Unknown time  . Cholecalciferol (VITAMIN D3) 2000 units capsule Take 6,000 Units by mouth daily.    Past Month at Unknown time  . Cyanocobalamin (B-12) 1000 MCG CAPS Take by mouth daily.    Past Week at Unknown time  . DULoxetine (CYMBALTA) 60 MG capsule duloxetine 60 mg capsule,delayed release   Past Week at Unknown time  . gabapentin (NEURONTIN) 300 MG capsule 300 mg 2 (two) times daily. Take 1 capsule (300 mg) in the morning and 2 capsules (600 mg) in the evening.   Past Week at Unknown time  . oxybutynin (DITROPAN-XL) 10 MG 24 hr tablet Take 1 tablet (10 mg total) by mouth daily. 30 tablet 11 Past Week at Unknown time  . prednisoLONE acetate (PRED FORTE) 1 % ophthalmic suspension Place 1 drop into the left eye 3 (three) times daily.   07/08/2020 at Unknown time  . rosuvastatin (CRESTOR) 5 MG tablet Take 5 mg by mouth once a week.   Past Week at Unknown time  . temazepam (RESTORIL) 15 MG capsule 30 mg at bedtime as needed for sleep.    Past Week at Unknown time  . triamterene-hydrochlorothiazide (MAXZIDE-25) 37.5-25 MG tablet Take 1 tablet by mouth daily.    Past Week at  Unknown time  . oxyCODONE (OXY IR/ROXICODONE) 5 MG immediate release tablet Take 1 tablet (5 mg total) by mouth every 6 (six) hours as needed for moderate pain or severe pain. (Patient not taking: Reported on 03/27/2020) 20 tablet 0   . pantoprazole (PROTONIX) 40 MG tablet Take 40 mg by mouth 2 (two) times daily before a meal.         Allergies  Allergen Reactions  . Aspirin Other (See Comments)    Epitaxis   . Atorvastatin Other (See Comments)  . Mirabegron Itching  . Statins Other (See Comments)  . Acetaminophen Palpitations     Past Medical History:  Diagnosis Date  . Arthritis   . Asthma   . Depression   . GERD (gastroesophageal reflux disease)   . Headache   . History of hiatal hernia   . Hyperlipidemia   . Hyperlipidemia   . Hypertension   . IBS (irritable bowel syndrome)   . Neuromuscular disorder (HCC)    neuropathy    Review of systems:  Otherwise negative.    Physical Exam  Gen: Alert, oriented. Appears stated age.  HEENT: Ann Benson/AT. PERRLA. Lungs: CTA, no wheezes. CV: RR nl S1, S2. Abd: soft, benign, no masses. BS+ Ext: No edema. Pulses 2+    Planned procedures: Proceed with EGD and colonoscopy. The patient understands the nature of the planned procedure, indications, risks, alternatives and potential complications including but not limited  to bleeding, infection, perforation, damage to internal organs and possible oversedation/side effects from anesthesia. The patient agrees and gives consent to proceed.  Please refer to procedure notes for findings, recommendations and patient disposition/instructions.     Ann Benson K. Norma Fredrickson, M.D. Gastroenterology 07/08/2020  1:10 PM

## 2020-07-08 NOTE — Anesthesia Postprocedure Evaluation (Signed)
Anesthesia Post Note  Patient: DOCIE ABRAMOVICH  Procedure(s) Performed: ESOPHAGOGASTRODUODENOSCOPY (EGD) (N/A ) COLONOSCOPY (N/A )  Patient location during evaluation: Endoscopy Anesthesia Type: General Level of consciousness: awake and awake and alert Pain management: pain level controlled Vital Signs Assessment: post-procedure vital signs reviewed and stable Respiratory status: spontaneous breathing Cardiovascular status: blood pressure returned to baseline Postop Assessment: no apparent nausea or vomiting Anesthetic complications: no   No complications documented.   Last Vitals:  Vitals:   07/08/20 1432 07/08/20 1442  BP:  105/86  Pulse:  80  Resp:    Temp:    SpO2: 99% 99%    Last Pain:  Vitals:   07/08/20 1442  TempSrc:   PainSc: 0-No pain                 Emilio Math

## 2020-07-08 NOTE — Interval H&P Note (Signed)
History and Physical Interval Note:  07/08/2020 1:13 PM  Ann Benson  has presented today for surgery, with the diagnosis of IDA,HX.OF DIVERTICULOSIS.  The various methods of treatment have been discussed with the patient and family. After consideration of risks, benefits and other options for treatment, the patient has consented to  Procedure(s): ESOPHAGOGASTRODUODENOSCOPY (EGD) (N/A) COLONOSCOPY (N/A) as a surgical intervention.  The patient's history has been reviewed, patient examined, no change in status, stable for surgery.  I have reviewed the patient's chart and labs.  Questions were answered to the patient's satisfaction.     Why, [place]

## 2020-07-08 NOTE — Anesthesia Procedure Notes (Signed)
Procedure Name: MAC Date/Time: 07/08/2020 1:32 PM Performed by: Lily Peer, Summer, CRNA Pre-anesthesia Checklist: Patient identified, Emergency Drugs available, Suction available, Patient being monitored and Timeout performed Patient Re-evaluated:Patient Re-evaluated prior to induction Oxygen Delivery Method: Simple face mask Induction Type: IV induction

## 2020-07-08 NOTE — Transfer of Care (Signed)
Immediate Anesthesia Transfer of Care Note  Patient: Ann Benson  Procedure(s) Performed: ESOPHAGOGASTRODUODENOSCOPY (EGD) (N/A ) COLONOSCOPY (N/A )  Patient Location: PACU and Endoscopy Unit  Anesthesia Type:General  Level of Consciousness: drowsy  Airway & Oxygen Therapy: Patient Spontanous Breathing  Post-op Assessment: Report given to RN and Post -op Vital signs reviewed and stable  Post vital signs: Reviewed and stable  Last Vitals:  Vitals Value Taken Time  BP 126/66 07/08/20 1412  Temp 36.4 C 07/08/20 1412  Pulse 83 07/08/20 1413  Resp 20 07/08/20 1413  SpO2 100 % 07/08/20 1413  Vitals shown include unvalidated device data.  Last Pain:  Vitals:   07/08/20 1259  TempSrc: Temporal  PainSc: 0-No pain         Complications: No complications documented.

## 2020-07-08 NOTE — Anesthesia Preprocedure Evaluation (Signed)
Anesthesia Evaluation  Patient identified by MRN, date of birth, ID band Patient awake    Reviewed: Allergy & Precautions, NPO status , Patient's Chart, lab work & pertinent test results  Airway Mallampati: II       Dental no notable dental hx. (+) Teeth Intact   Pulmonary asthma , COPD,    Pulmonary exam normal breath sounds clear to auscultation       Cardiovascular hypertension, + Peripheral Vascular Disease  Normal cardiovascular exam Rhythm:Regular Rate:Normal     Neuro/Psych  Headaches, PSYCHIATRIC DISORDERS Anxiety Depression  Neuromuscular disease    GI/Hepatic Neg liver ROS, hiatal hernia, GERD  ,  Endo/Other  diabetes  Renal/GU CRFRenal disease  negative genitourinary   Musculoskeletal  (+) Arthritis ,   Abdominal   Peds negative pediatric ROS (+)  Hematology negative hematology ROS (+)   Anesthesia Other Findings .Marland KitchenPast Medical History: No date: Arthritis No date: Asthma No date: Depression No date: GERD (gastroesophageal reflux disease) No date: Headache No date: History of hiatal hernia No date: Hyperlipidemia No date: Hyperlipidemia No date: Hypertension No date: IBS (irritable bowel syndrome) No date: Neuromuscular disorder (HCC)     Comment:  neuropathy  Reproductive/Obstetrics negative OB ROS                             Anesthesia Physical Anesthesia Plan  ASA: III  Anesthesia Plan: General   Post-op Pain Management:    Induction: Intravenous  PONV Risk Score and Plan: 3 and Propofol infusion  Airway Management Planned: Nasal Cannula  Additional Equipment:   Intra-op Plan:   Post-operative Plan:   Informed Consent: I have reviewed the patients History and Physical, chart, labs and discussed the procedure including the risks, benefits and alternatives for the proposed anesthesia with the patient or authorized representative who has indicated his/her  understanding and acceptance.       Plan Discussed with: CRNA, Anesthesiologist and Surgeon  Anesthesia Plan Comments:         Anesthesia Quick Evaluation

## 2020-07-08 NOTE — Op Note (Signed)
[facility] Patient Name: Ann Benson Procedure Date: 07/08/2020 1:32 PM MRN: [id number] Account #: [id number] Date of Birth: [birth year] Admit Type: Outpatient Age: 79 Room: [facility] ENDO ROOM 2 Gender: Female Note Status: Finalized Procedure:             Colonoscopy Indications:           Unexplained iron deficiency anemia, Follow-up of                         diverticulitis, Change in bowel habits Providers:             Boykin Nearing. Norma Fredrickson MD, MD Referring MD:          Daniel Nones, MD (Referring MD) Medicines:             Propofol per Anesthesia Complications:         No immediate complications. Procedure:             Pre-Anesthesia Assessment:                        - The risks and benefits of the procedure and the                         sedation options and risks were discussed with the                         patient. All questions were answered and informed                         consent was obtained.                        - Patient identification and proposed procedure were                         verified prior to the procedure by the nurse. The                         procedure was verified in the procedure room.                        - ASA Grade Assessment: III - A patient with severe                         systemic disease.                        - After reviewing the risks and benefits, the patient                         was deemed in satisfactory condition to undergo the                         procedure.                        After obtaining informed consent, the colonoscope was                         passed under direct vision. Throughout the  procedure,                         the patient's blood pressure, pulse, and oxygen                         saturations were monitored continuously. The                         Colonoscope was introduced through the anus and                         advanced to the the cecum, identified  by appendiceal                         orifice and ileocecal valve. The colonoscopy was                         performed without difficulty. The patient tolerated                         the procedure well. The quality of the bowel                         preparation was good. Findings:      The perianal and digital rectal examinations were normal. Pertinent       negatives include normal sphincter tone and no palpable rectal lesions.      Multiple small and large-mouthed diverticula were found in the left       colon.      A diffuse area of mildly erythematous mucosa was found in the ascending       colon. Biopsies were taken with a cold forceps for histology. Rule out       portal hypertensive colopathy Rule out ischemia      Non-bleeding internal hemorrhoids were found during retroflexion. The       hemorrhoids were Grade I (internal hemorrhoids that do not prolapse).      The exam was otherwise without abnormality. Impression:            - Diverticulosis in the left colon.                        - Erythematous mucosa in the ascending colon. Biopsied.                        - Non-bleeding internal hemorrhoids.                        - The examination was otherwise normal. Recommendation:        - Await pathology results from EGD, also performed                         today.                        - Patient has a contact number available for                         emergencies. The signs and symptoms of potential  delayed complications were discussed with the patient.                         Return to normal activities tomorrow. Written                         discharge instructions were provided to the patient.                        - Resume previous diet.                        - Continue present medications.                        - Await pathology results.                        - No repeat colonoscopy due to current age (71 years                          or older) and the absence of colonic polyps.                        - Will consider small bowel wireless capsule endoscopy                         based upon review of pathology results.                        - Return to physician assistant in 2 months.                        - Follow up with Jacob Moores, PA-C in 2 months.                        - The findings and recommendations were discussed with                         the patient. Procedure Code(s):     --- Professional ---                        [phone], Colonoscopy, flexible; with biopsy, single or                         multiple Diagnosis Code(s):     --- Professional ---                        K57.30, Diverticulosis of large intestine without                         perforation or abscess without bleeding                        R19.4, Change in bowel habit                        K57.32, Diverticulitis of large intestine without  perforation or abscess without bleeding                        D50.9, Iron deficiency anemia, unspecified                        K63.89, Other specified diseases of intestine                        K64.0, First degree hemorrhoids CPT copyright 2019 [employer]. All rights reserved. The codes documented in this report are preliminary and upon coder review may  be revised to meet current compliance requirements. Stanton Kidney MD, MD 07/08/2020 2:16:06 PM This report has been signed electronically. Number of Addenda: 0 Note Initiated On: 07/08/2020 1:32 PM Scope Withdrawal Time: 0 hours 5 minutes 46 seconds  Total Procedure Duration: 0 hours 14 minutes 41 seconds  Estimated Blood Loss:  Estimated blood loss: none.      [facility]

## 2020-07-08 NOTE — Op Note (Signed)
[facility] Gastroenterology Patient Name: Ann Benson Procedure Date: 07/08/2020 1:30 PM MRN: [id number] Account #: [id number] Date of Birth: 05-06-1941 Admit Type: Outpatient Age: 79 Room: [facility] ENDO ROOM 2 Gender: Female Note Status: Finalized Procedure:             Upper GI endoscopy Indications:           Dysphagia, Esophageal reflux, Failure to respond to                         medical treatment, Abnormal cine-esophagram, Weight                         loss Providers:             Boykin Nearing. Norma Fredrickson MD, MD Referring MD:          Daniel Nones, MD (Referring MD) Medicines:             Propofol per Anesthesia Complications:         No immediate complications. Procedure:             Pre-Anesthesia Assessment:                        - The risks and benefits of the procedure and the                         sedation options and risks were discussed with the                         patient. All questions were answered and informed                         consent was obtained.                        - Patient identification and proposed procedure were                         verified prior to the procedure by the nurse. The                         procedure was verified in the procedure room.                        - ASA Grade Assessment: III - A patient with severe                         systemic disease.                        - After reviewing the risks and benefits, the patient                         was deemed in satisfactory condition to undergo the                         procedure.                        After obtaining informed consent, the endoscope was  passed under direct vision. Throughout the procedure,                         the patient's blood pressure, pulse, and oxygen                         saturations were monitored continuously. The Endoscope                         was introduced through the mouth, and advanced to  the                         third part of duodenum. The upper GI endoscopy was                         accomplished without difficulty. The patient tolerated                         the procedure well. Findings:      Moderate tortuosity of the mid to distal esophagus was noted compatible       with a diagnosis of Presbyesophagus.      A non-obstructing Schatzki ring was found at the gastroesophageal       junction. The scope was withdrawn. Dilation was performed with a Maloney       dilator with mild resistance at 54 Fr.      A 3 cm hiatal hernia was present.      Patchy mild inflammation characterized by erythema was found in the       gastric antrum.      The examined duodenum was normal.      Normal mucosa was found in the entire esophagus. Biopsies were obtained       from the proximal and distal esophagus with cold forceps for histology       of suspected eosinophilic esophagitis. Impression:            - Non-obstructing Schatzki ring. Dilated.                        - 3 cm hiatal hernia.                        - Gastritis.                        - Normal examined duodenum.                        - The examination was otherwise normal.                        - No specimens collected. Recommendation:        - Await pathology results.                        - Monitor results to esophageal dilation                        - Proceed with colonoscopy Procedure Code(s):     --- Professional ---  29924, Esophagogastroduodenoscopy, flexible,                         transoral; with biopsy, single or multiple                        43450, Dilation of esophagus, by unguided sound or                         bougie, single or multiple passes Diagnosis Code(s):     --- Professional ---                        R93.3, Abnormal findings on diagnostic imaging of                         other parts of digestive tract                        R63.4, Abnormal weight loss                         K21.9, Gastro-esophageal reflux disease without                         esophagitis                        R13.10, Dysphagia, unspecified                        K29.70, Gastritis, unspecified, without bleeding                        K44.9, Diaphragmatic hernia without obstruction or                         gangrene                        K22.2, Esophageal obstruction CPT copyright 2019 American Medical Association. All rights reserved. The codes documented in this report are preliminary and upon coder review may  be revised to meet current compliance requirements. Stanton Kidney MD, MD 07/08/2020 1:51:28 PM This report has been signed electronically. Number of Addenda: 0 Note Initiated On: 07/08/2020 1:30 PM Estimated Blood Loss:  Estimated blood loss: none. Estimated blood loss: none.      [facility]

## 2020-07-09 ENCOUNTER — Encounter: Payer: Self-pay | Admitting: Internal Medicine

## 2020-07-11 LAB — SURGICAL PATHOLOGY

## 2020-09-02 DIAGNOSIS — Z8616 Personal history of COVID-19: Secondary | ICD-10-CM | POA: Insufficient documentation

## 2020-09-03 ENCOUNTER — Other Ambulatory Visit: Payer: Self-pay | Admitting: Internal Medicine

## 2020-09-03 DIAGNOSIS — Z1231 Encounter for screening mammogram for malignant neoplasm of breast: Secondary | ICD-10-CM

## 2020-09-17 ENCOUNTER — Ambulatory Visit: Payer: Medicare Other | Attending: Internal Medicine

## 2020-09-17 ENCOUNTER — Other Ambulatory Visit: Payer: Self-pay | Admitting: Internal Medicine

## 2020-09-17 DIAGNOSIS — Z23 Encounter for immunization: Secondary | ICD-10-CM

## 2020-09-17 NOTE — Progress Notes (Signed)
   Covid-19 Vaccination Clinic  Name:  Ann Benson    MRN: [id number] DOB: [birth year]  09/17/2020  [name] was observed post Covid-19 immunization for 15 minutes without incident. She was provided with Vaccine Information Sheet and instruction to access the V-Safe system.   [name] was instructed to call 911 with any severe reactions post vaccine: Marland Kitchen Difficulty breathing  . Swelling of face and throat  . A fast heartbeat  . A bad rash all over body  . Dizziness and weakness   Immunizations Administered    Name Date Dose VIS Date Route   PFIZER Comrnaty(Gray TOP) Covid-19 Vaccine 09/17/2020 11:29 AM 0.3 mL 07/18/2020 Intramuscular   Manufacturer: [employer], [employer]   Lot: [id number]   NDC: [id number]

## 2020-10-04 ENCOUNTER — Other Ambulatory Visit: Payer: Self-pay | Admitting: Otolaryngology

## 2020-10-04 DIAGNOSIS — R1312 Dysphagia, oropharyngeal phase: Secondary | ICD-10-CM

## 2020-10-04 DIAGNOSIS — R490 Dysphonia: Secondary | ICD-10-CM

## 2020-11-13 ENCOUNTER — Ambulatory Visit: Payer: Medicare Other | Admitting: Podiatry

## 2020-11-26 ENCOUNTER — Other Ambulatory Visit: Payer: Self-pay

## 2020-11-26 ENCOUNTER — Ambulatory Visit
Admission: RE | Admit: 2020-11-26 | Discharge: 2020-11-26 | Disposition: A | Discharge status: Home / Self Care | Payer: Medicare Other | Source: Ambulatory Visit | Attending: Otolaryngology | Admitting: Otolaryngology

## 2020-11-26 DIAGNOSIS — R1312 Dysphagia, oropharyngeal phase: Secondary | ICD-10-CM | POA: Insufficient documentation

## 2020-11-26 DIAGNOSIS — R490 Dysphonia: Secondary | ICD-10-CM

## 2020-11-26 NOTE — Therapy (Signed)
[facility] DIAGNOSTIC RADIOLOGY [address] [place], [place], [address] Phone: [phone]   Fax:     Modified Barium Swallow  Patient Details  Name: Ann Benson MRN: [id number] Date of Birth: [birth year] No data recorded  Encounter Date: 11/26/2020   End of Session - 11/26/20 1453    Visit Number 1    Number of Visits 1    Date for SLP Re-Evaluation 11/26/20    SLP Start Time 1240    SLP Stop Time  1305    SLP Time Calculation (min) 25 min    Activity Tolerance Patient tolerated treatment well            There were no vitals filed for this visit.   Subjective Assessment - 11/26/20 1336    Subjective "I haven't had saliva since I had Covid."    Currently in Pain? No/denies             Objective Swallowing Evaluation: Type of Study: MBS-Modified Barium Swallow Study   Patient Details  Name: Ann Benson MRN: [id number] Date of Birth: 14-Aug-1940  Today's Date: 11/26/2020 Time: SLP Start Time (ACUTE ONLY): 1240 -SLP Stop Time (ACUTE ONLY): 1305  SLP Time Calculation (min) (ACUTE ONLY): 25 min   Past Medical History:  Past Medical History:  Diagnosis Date  . Arthritis   . Asthma   . Depression   . GERD (gastroesophageal reflux disease)   . Headache   . History of hiatal hernia   . Hyperlipidemia   . Hyperlipidemia   . Hypertension   . IBS (irritable bowel syndrome)   . Neuromuscular disorder (HCC)    neuropathy   Past Surgical History:  Past Surgical History:  Procedure Laterality Date  . ABDOMINAL HYSTERECTOMY    . APPENDECTOMY    . BREAST BIOPSY Left 07/2013   neg  . CHOLECYSTECTOMY    . COLONOSCOPY N/A 07/08/2020   Procedure: COLONOSCOPY;  Surgeon: Toledo, Boykin Nearing, MD;  Location: [facility] ENDOSCOPY;  Service: Gastroenterology;  Laterality: N/A;  . ESOPHAGOGASTRODUODENOSCOPY N/A 07/08/2020   Procedure: ESOPHAGOGASTRODUODENOSCOPY (EGD);  Surgeon: Toledo, Boykin Nearing, MD;  Location: [facility] ENDOSCOPY;   Service: Gastroenterology;  Laterality: N/A;  . ESOPHAGOGASTRODUODENOSCOPY (EGD) WITH PROPOFOL N/A 03/16/2018   Procedure: ESOPHAGOGASTRODUODENOSCOPY (EGD) WITH PROPOFOL;  Surgeon: Toledo, Boykin Nearing, MD;  Location: [facility] ENDOSCOPY;  Service: Gastroenterology;  Laterality: N/A;  . EYE SURGERY     cataracts  . INCISION AND DRAINAGE ABSCESS Right 03/18/2020   Procedure: INCISION AND DRAINAGE ABSCESS-Breast;  Surgeon: Henrene Dodge, MD;  Location: [facility] ORS;  Service: General;  Laterality: Right;  . KNEE ARTHROSCOPY    . SHOULDER ARTHROSCOPY Left    HPI: Ann Benson is an 80 y.o. female referred by Dr. Andee Poles for dysphagia. Pt reported hoarseness, anosmia, xerostomia, and dysphagia since Covid-19 infection in January 2022. 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 Hx also noted for hiatal hernia, GERD, IBS, COPD. Patient reports difficulties with dry solids, meats, and pills.   Subjective: pt complains of xerostomia    Assessment / Plan / Recommendation  CHL IP CLINICAL IMPRESSIONS 11/26/2020  Clinical Impression Patient presents with mild oral dysphagia, and suspected primary esophageal dysphagia. Oral phase is characterized by xerostomia which impacts bolus formation and oral transfer of solids and pill. Prolonged bolus formation, decreased posterior propulsion and cohesion as bolus collects and is held in the vallecular space until oral transfer is complete. Pt attempted oral transfer of  barium pill with water x2 and was unable to transit, at one point tilting her head posteriorly in effort to aid movement. Ultimately, she was able to swallow it whole in puree. Pharyngeal phase is characterized by adequate tongue base retraction, hyolaryngeal excursion, and pharyngeal constriction. Epiglottic deflection is complete as is airway protection, with no abnormal residue remaining in the pharynx. Flash penetration occurs x1 with consecutive sips of thin,  otherwise no other penetration, and there was no tracheal aspiration observed on this examination. Prominent cricopharyngeus is noted, however this does not impede pharyngeal clearance. Oral deficits may be exacerbated by need to generate increased intrabolus pressure if prominent cricopharyngeus creating higher resistance to bolus flow; further assessment of esophageal function such as manometry may be beneficial. An esophageal sweep was performed and was noted for dysmotility, tertiary contractions per the radiologist. Study images were reviewed with the patient as well as recommendations including increasing consumption of liquids during meals to compensate for xerostomia and to aid esophageal clearance. Encouraged pt to continue soft diet, avoid troublesome foods, and alternate solids and liquids. Educated on reflux precautions. Encouraged her to discuss xerostomia with her MD and determine whether this may be a medication side effect. Reinforced importance of hydration to keep mucous membranes moist (increase water, decrease caffeine); pt can use liquids, sauces, and gravy at mealtimes for ease with solids. At this time no further skilled ST indicated for dysphagia; patient has appointment in clinic to follow up for dysphonia in May.  SLP Visit Diagnosis Dysphagia, oral phase (R13.11);Dysphagia, pharyngoesophageal phase (R13.14)  Attention and concentration deficit following --  Frontal lobe and executive function deficit following --  Impact on safety and function Mild aspiration risk      CHL IP TREATMENT RECOMMENDATION 11/26/2020  Treatment Recommendations No treatment recommended at this time     Prognosis 11/26/2020  Prognosis for Safe Diet Advancement Fair  Barriers to Reach Goals --  Barriers/Prognosis Comment --    CHL IP DIET RECOMMENDATION 11/26/2020  SLP Diet Recommendations Dysphagia 3 (Mech soft) solids;Thin liquid  Liquid Administration via Cup  Medication Administration Whole meds  with puree  Compensations Slow rate;Small sips/bites;Follow solids with liquid  Postural Changes Remain semi-upright after after feeds/meals (Comment);Seated upright at 90 degrees      CHL IP OTHER RECOMMENDATIONS 11/26/2020  Recommended Consults Consider esophageal assessment  Oral Care Recommendations Oral care BID  Other Recommendations --      CHL IP FOLLOW UP RECOMMENDATIONS 11/26/2020  Follow up Recommendations None      No flowsheet data found.         CHL IP ORAL PHASE 11/26/2020  Oral Phase Impaired  Oral - Pudding Teaspoon --  Oral - Pudding Cup --  Oral - Honey Teaspoon --  Oral - Honey Cup --  Oral - Nectar Teaspoon --  Oral - Nectar Cup Reduced posterior propulsion;Decreased bolus cohesion;Delayed oral transit  Oral - Nectar Straw --  Oral - Thin Teaspoon --  Oral - Thin Cup Reduced posterior propulsion;Premature spillage;Delayed oral transit;Decreased bolus cohesion  Oral - Thin Straw --  Oral - Puree Lingual pumping;Reduced posterior propulsion;Delayed oral transit;Decreased bolus cohesion;Premature spillage  Oral - Mech Soft Lingual pumping;Reduced posterior propulsion;Delayed oral transit;Decreased bolus cohesion;Premature spillage  Oral - Regular --  Oral - Multi-Consistency --  Oral - Pill Lingual pumping;Reduced posterior propulsion;Delayed oral transit;Decreased bolus cohesion;Holding of bolus  Oral Phase - Comment suspect esophageal deficits/abnormal pressures may be contributing to oral deficits, although xerostomia does play a role  CHL IP PHARYNGEAL PHASE 11/26/2020  Pharyngeal Phase WFL;Impaired  Pharyngeal- Pudding Teaspoon --  Pharyngeal --  Pharyngeal- Pudding Cup --  Pharyngeal --  Pharyngeal- Honey Teaspoon --  Pharyngeal --  Pharyngeal- Honey Cup --  Pharyngeal --  Pharyngeal- Nectar Teaspoon --  Pharyngeal --  Pharyngeal- Nectar Cup [facility]  Pharyngeal Material does not enter airway  Pharyngeal- Nectar Straw --  Pharyngeal --   Pharyngeal- Thin Teaspoon --  Pharyngeal --  Pharyngeal- Thin Cup [facility]  Pharyngeal Material does not enter airway;Material enters airway, remains ABOVE vocal cords then ejected out  Pharyngeal- Thin Straw --  Pharyngeal --  Pharyngeal- Puree WFL  Pharyngeal Material does not enter airway  Pharyngeal- Mechanical Soft WFL  Pharyngeal Material does not enter airway  Pharyngeal- Regular --  Pharyngeal --  Pharyngeal- Multi-consistency --  Pharyngeal --  Pharyngeal- Pill WFL  Pharyngeal Material does not enter airway  Pharyngeal Comment --     CHL IP CERVICAL ESOPHAGEAL PHASE 11/26/2020  Cervical Esophageal Phase Impaired  Pudding Teaspoon --  Pudding Cup --  Honey Teaspoon --  Honey Cup --  Nectar Teaspoon --  Nectar Cup --  Nectar Straw --  Thin Teaspoon --  Thin Cup --  Thin Straw --  Puree Prominent cricopharyngeal segment  Mechanical Soft Prominent cricopharyngeal segment  Regular --  Multi-consistency --  Pill Prominent cricopharyngeal segment  Cervical Esophageal Comment --     Ann Benson 11/26/2020, 2:54 PM   Oropharyngeal dysphagia - Plan: DG SWALLOW FUNC OP MEDICARE SPEECH PATH, DG SWALLOW FUNC OP MEDICARE SPEECH PATH  Dysphonia - Plan: DG SWALLOW FUNC OP MEDICARE SPEECH PATH, DG SWALLOW FUNC OP MEDICARE SPEECH PATH        Problem List Patient Active Problem List   Diagnosis Date Noted  . Cellulitis 03/17/2020  . Breast abscess of female   . Acute gastroenteritis 03/16/2020  . Lump of right breast 03/16/2020  . Dystrophic nail 10/26/2019  . Diabetic neuropathy (HCC) 10/26/2019  . Anxiety 01/17/2018  . Hyperlipidemia 01/17/2018  . IBS (irritable bowel syndrome) 01/17/2018  . Intra-abdominal hernia 01/17/2018  . Migraine 01/17/2018  . Lumbar degenerative disc disease 11/30/2017  . Radicular leg pain 09/21/2017  . Lymphedema 05/26/2017  . Leg pain 04/26/2017  . Neuropathy 04/26/2017  . Chronic venous insufficiency 04/26/2017  . PAD  (peripheral artery disease) (HCC) 04/26/2017  . Lumbar spondylosis with myelopathy 04/26/2017  . COPD (chronic obstructive pulmonary disease) (HCC) 03/31/2017  . GERD (gastroesophageal reflux disease) 03/31/2017  . Polyneuropathy associated with underlying disease (HCC) 03/04/2017  . Stage 3 chronic kidney disease 03/04/2017  . Acute respiratory failure with hypoxia (HCC) 07/19/2016  . Aortic atherosclerosis (HCC) 04/16/2016  . Claudication (HCC) 04/15/2016  . Recurrent major depressive disorder, in full remission (HCC) 10/10/2015  . Essential hypertension 04/11/2015  . Dysphagia 11/27/2014  . Edema of both legs 12/21/2013   Rondel Baton, MS, CCC-SLP Speech-Language Pathologist  Ann Benson 11/26/2020, 2:54 PM  [facility] DIAGNOSTIC RADIOLOGY [address] [place], [place], [address] Phone: [phone]   Fax:     Name: Ann Benson MRN: [id number] Date of Birth: Dec 01, 1940

## 2020-11-28 ENCOUNTER — Other Ambulatory Visit: Payer: Self-pay

## 2020-11-28 ENCOUNTER — Ambulatory Visit
Admission: RE | Admit: 2020-11-28 | Discharge: 2020-11-28 | Disposition: A | Discharge status: Home / Self Care | Payer: Medicare Other | Source: Ambulatory Visit | Attending: Internal Medicine | Admitting: Internal Medicine

## 2020-11-28 DIAGNOSIS — Z1231 Encounter for screening mammogram for malignant neoplasm of breast: Secondary | ICD-10-CM

## 2020-12-04 ENCOUNTER — Encounter: Payer: Self-pay | Admitting: Podiatry

## 2020-12-04 ENCOUNTER — Ambulatory Visit: Payer: Medicare Other | Admitting: Podiatry

## 2020-12-04 ENCOUNTER — Other Ambulatory Visit: Payer: Self-pay

## 2020-12-04 DIAGNOSIS — M79676 Pain in unspecified toe(s): Secondary | ICD-10-CM

## 2020-12-04 DIAGNOSIS — E1142 Type 2 diabetes mellitus with diabetic polyneuropathy: Secondary | ICD-10-CM

## 2020-12-04 DIAGNOSIS — B351 Tinea unguium: Secondary | ICD-10-CM | POA: Diagnosis not present

## 2020-12-04 DIAGNOSIS — R0989 Other specified symptoms and signs involving the circulatory and respiratory systems: Secondary | ICD-10-CM

## 2020-12-04 NOTE — Progress Notes (Signed)
She presents today chief complaint of painful toenails bilaterally.  States that her legs are painful when she walks as well.  He is concerned because the toenails are so painful she can allow the sheets to touch them.  She also has a painful callus to the distal aspect of the hallux left.  Objective: Vital signs are stable she is alert oriented x3 pulses are palpable.  There is no erythema edema cellulitis drainage or odor.  Pulses are palpable with deep but diminished and capillary fill time is sluggish.  Her toenails are thick yellow dystrophic clinically mycotic painful palpation as well as debridement she does have a distal clavus to the hallux left.  Assessment: Benign skin lesion idiopathic neuropathy peripheral vascular disease and nail dystrophy.  Plan: We will send her for vascular studies and I debrided the reactive hyperkeratosis and debrided her toenails for her.

## 2020-12-11 ENCOUNTER — Other Ambulatory Visit: Payer: Self-pay

## 2020-12-11 ENCOUNTER — Encounter: Payer: Self-pay | Admitting: Speech Pathology

## 2020-12-11 ENCOUNTER — Ambulatory Visit: Payer: Medicare Other | Attending: Otolaryngology | Admitting: Speech Pathology

## 2020-12-11 DIAGNOSIS — R490 Dysphonia: Secondary | ICD-10-CM | POA: Insufficient documentation

## 2020-12-11 NOTE — Therapy (Signed)
[facility] MAIN [facility] SERVICES [address] [place], [place], [address] Phone: [phone]   Fax:  [phone]  Speech Language Pathology Evaluation  Patient Details  Name: Ann Benson MRN: [id number] Date of Birth: [birth year] Referring Provider (SLP): Dr. Andee Poles   Encounter Date: 12/11/2020   End of Session - 12/11/20 1031    Visit Number 1    Number of Visits 17    Date for SLP Re-Evaluation 03/11/21    Authorization Type [employer]HC Medicare    Authorization - Visit Number 1    Progress Note Due on Visit 10    SLP Start Time 1008    SLP Stop Time  1100    SLP Time Calculation (min) 52 min    Activity Tolerance Patient tolerated treatment well           Past Medical History:  Diagnosis Date  . Arthritis   . Asthma   . Depression   . GERD (gastroesophageal reflux disease)   . Headache   . History of hiatal hernia   . Hyperlipidemia   . Hyperlipidemia   . Hypertension   . IBS (irritable bowel syndrome)   . Neuromuscular disorder (HCC)    neuropathy    Past Surgical History:  Procedure Laterality Date  . ABDOMINAL HYSTERECTOMY    . APPENDECTOMY    . BREAST BIOPSY Left 07/2013   neg  . CHOLECYSTECTOMY    . COLONOSCOPY N/A 07/08/2020   Procedure: COLONOSCOPY;  Surgeon: Toledo, Boykin Nearing, MD;  Location: [facility] ENDOSCOPY;  Service: Gastroenterology;  Laterality: N/A;  . ESOPHAGOGASTRODUODENOSCOPY N/A 07/08/2020   Procedure: ESOPHAGOGASTRODUODENOSCOPY (EGD);  Surgeon: Toledo, Boykin Nearing, MD;  Location: [facility] END[facility];  Service: Gastroenterology;  Laterality: N/A;  . ESOPHAGOGASTRODUODENOSCOPY (EGD) WITH PROPOFOL N/A 03/16/2018   Procedure: ESOPHAGOGASTRODUODENOSCOPY (EGD) WITH PROPOFOL;  Surgeon: Toledo, Boykin Nearing, MD;  Location: [facility] ENDOSCOPY;  Service: Gastroenterology;  Laterality: N/A;  . EYE SURGERY     cataracts  . INCISION AND DRAINAGE ABSCESS Right 03/18/2020   Procedure: INCISION AND DRAINAGE ABSCESS-Breast;  Surgeon:  Henrene Dodge, MD;  Location: [facility] ORS;  Service: General;  Laterality: Right;  . KNEE ARTHROSCOPY    . SHOULDER ARTHROSCOPY Left     There were no vitals filed for this visit.   Subjective Assessment - 12/11/20 1016    Subjective "My throat is dry as a chip."    Currently in Pain? No/denies              SLP Evaluation [facility] - 12/11/20 1016      SLP Visit Information   SLP Received On 12/11/20    Referring Provider (SLP) Dr. Andee Poles    Onset Date 10/09/2020    Medical Diagnosis Dysphonia      Subjective   Subjective I'm still kind of raspy, it gets worse at night    Patient/Family Stated Goal improve raspy voice      General Information   HPI Ann Benson is an 80 y.o. female referred by Dr. Andee Poles for dysphonia. Pt reported hoarseness, anosmia, xerostomia, and dysphagia since Covid-19 infection in January 2022. 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 Hx also noted for hiatal hernia, GERD, IBS, COPD. MBS 2 weeks ago showed mild oral dysphagia due to xerostomia, likely primary esophageal dysphagia.    Mobility Status ambulated unassisted      Balance Screen   Has the patient fallen in the past 6 months No  Has the patient had a decrease in activity level because of a fear of falling?  No    Is the patient reluctant to leave their home because of a fear of falling?  No      Prior Functional Status   Cognitive/Linguistic Baseline Within functional limits      Pain Assessment   Pain Assessment No/denies pain      Cognition   Overall Cognitive Status Within Functional Limits for tasks assessed      Auditory Comprehension   Overall Auditory Comprehension Appears within functional limits for tasks assessed      Visual Recognition/Discrimination   Discrimination Not tested      Reading Comprehension   Reading Status Within funtional limits      Expression   Primary Mode of Expression Verbal      Verbal Expression    Overall Verbal Expression Appears within functional limits for tasks assessed      Written Expression   Dominant Hand Right    Written Expression Not tested      Oral Motor/Sensory Function   Overall Oral Motor/Sensory Function Appears within functional limits for tasks assessed      Motor Speech   Overall Motor Speech Impaired    Respiration Impaired    Level of Impairment Phrase    Phonation Hoarse    Articulation Within functional limitis    Intelligibility Intelligible    Motor Planning Witnin functional limits    Phonation Impaired    Vocal Abuses Vocal Fold Dehydration;Habitual Cough/Throat Clear    Tension Present Neck    Volume --   reduced ability to project   Pitch High      Standardized Assessments   Standardized Assessments  Other Assessment            Perceptual Voice Evaluation    Voice Case History     Health risks:    Mild-moderate  ; caffeine use approx. 0-8 oz daily, daily H20 intake average 36 oz, pt is a nonsmoker, hx GERD, hiatal hernia    Characteristic voice use: minimal at this time; wants to talk on the phone but avoids conversations due to voice problem   Environmental risks: none reported   Misuse: clavicular-centered breathing, neck tension   Phonotraumatic behaviors: throat clearing   Vocal characteristics: Hoarse, strained, raspy, reduced ability to project, reduced vocal endurance  Objective Voice Measurements   Maximum phonation time for sustained "ah": 9.8 seconds   Average fundamental frequency during sustained "ah":   227 Hz (WNL for gender)  Average intensity for sustained "ah": 76 dB  Relative Average Perterbuation for "ah": 2.6%  Shimmer Percent 13.5 %  Noise to Harmonic Ratio: 0.24  Voice Turbulence Index 0.027   Habitual pitch: 199 Hz    Highest dynamic pitch in conversational speech: 320 Hz   Lowest dynamic pitch in conversational speech: 88 Hz   Average time patient was able to sustain /s/:  10.6  seconds   Average time patient was able to sustain /z/: 8.9 seconds   s/z ratio:  (suggestive of dysfunction > 1.0) 1.19   V-RQOL Score:  (46 /50) (10-15 =excellent, 16-20=very good, 21-25 = good, 26-30 = fair, 30+ = poor)  The Voice-Related Quality of Life (V-RQOL) measure is a patient-reported outcome measure assessing voice-related problems. Patient reported significant problems with speaking loudly, running out of air when talking, feeling anxious and depressed due to voice, difficulty using the phone, avoiding social interactions due to voice, and needing to  repeat herself.         SLP Assessment/Plan  Clinical Impression Statement Ann Scarletlizabeth Benson presents with mild dysphonia characterized by hoarse, raspy vocal quality, pitch breaks, and intermittent strained vocal quality. ENT reported bowing of vocal cords and significant MTD. Patient reports frustration with her voice and needing to repeat herself. She reports avoiding conversations, particularly on the phone, due to her vocal quality. I recommend skilled ST to train pt in vocal hygiene, improve breath support for voice and reduce laryngeal tension in order to improve vocal quality, endurance, and quality of life.  Speech Therapy Frequency  (1-2 x per week)  Duration  (17 total sessions)  Treatment/Interventions SLP instruction and feedback;Patient/family education;Cueing hierarchy;Environmental controls;Other (comment) (vocal hygiene, relaxation, resonant voice therapy, abdominal breathing)  Potential to Achieve Goals Good  Potential Considerations Ability to learn/carryover information  SLP Home Exercise Plan to be provided next session  Consulted and Agree with Plan of Care Patient      SLP Education - 12/11/20 1030    Education Details proposed therapy goals, increase H20 intake    Person(s) Educated Patient    Methods Explanation    Comprehension Verbalized understanding;Need further instruction            SLP  Short Term Goals - 12/11/20 1040      SLP SHORT TERM GOAL #1   Title Pt will demo HEP for breath support, MTD accurately with rare min cues.    Time 10   sessions   Status New    Target Date 01/10/21      SLP SHORT TERM GOAL #2   Title Patient will use abdominal breathing >90% accuracy in structured sentence level tasks.    Time 10    Period --   sessions   Target Date 01/10/21      SLP SHORT TERM GOAL #3   Title Patient will ID tension/strain >90% accuracy in phrase level tasks.    Time 10    Period --   sessions   Status New    Target Date 01/10/21      SLP SHORT TERM GOAL #4   Title Patient will maintain adequate vocal quality and endurance in 5 minute conversation >85% of the time using abdominal breathing and resonant voice techniques.    Time 10   sessions   Status New    Target Date 01/10/21           SLP Long Term Goals - 12/11/20 1042      SLP LONG TERM GOAL #1   Title Pt will report carryover of 4 vocal hygiene techniques between 3 consecutive sessions.    Time 12   or 17 sessions, for all LTGs   Period Weeks      SLP LONG TERM GOAL #2   Title Patient will maintain adequate vocal quality/endurance in 20 minute conversation using abdominal breathing and/or resonant voice >85% of the time.    Time 12    Period Weeks    Status New      SLP LONG TERM GOAL #3   Title Patient will report improvement in voice outcome as measured by Voice Related Quality of Life.    Baseline 12/11/20 46/50    Time 12    Period Weeks    Status New               Patient will benefit from skilled therapeutic intervention in order to improve the following deficits and impairments:   Dysphonia  Problem List Patient Active Problem List   Diagnosis Date Noted  . History of 2019 novel coronavirus disease (COVID-19) 09/02/2020  . UTI (urinary tract infection) 04/17/2020  . Diverticulitis large intestine w/o perforation or abscess w/o bleeding 04/09/2020  . Pneumonia  04/09/2020  . Cellulitis 03/17/2020  . Breast abscess of female   . Acute gastroenteritis 03/16/2020  . Lump of right breast 03/16/2020  . Dystrophic nail 10/26/2019  . Diabetic neuropathy (HCC) 10/26/2019  . Senile osteoporosis 01/26/2018  . Anxiety 01/17/2018  . Hyperlipidemia 01/17/2018  . IBS (irritable bowel syndrome) 01/17/2018  . Intra-abdominal hernia 01/17/2018  . Migraine 01/17/2018  . Lumbar degenerative disc disease 11/30/2017  . Radicular leg pain 09/21/2017  . Lymphedema 05/26/2017  . Leg pain 04/26/2017  . Neuropathy 04/26/2017  . Chronic venous insufficiency 04/26/2017  . PAD (peripheral artery disease) (HCC) 04/26/2017  . Lumbar spondylosis with myelopathy 04/26/2017  . COPD (chronic obstructive pulmonary disease) (HCC) 03/31/2017  . GERD (gastroesophageal reflux disease) 03/31/2017  . Polyneuropathy associated with underlying disease (HCC) 03/04/2017  . Stage 3 chronic kidney disease 03/04/2017  . Acute respiratory failure with hypoxia (HCC) 07/19/2016  . Aortic atherosclerosis (HCC) 04/16/2016  . Claudication (HCC) 04/15/2016  . Recurrent major depressive disorder, in full remission (HCC) 10/10/2015  . Essential hypertension 04/11/2015  . Dysphagia 11/27/2014  . Edema of both legs 12/21/2013   Rondel Baton, MS, CCC-SLP Speech-Language Pathologist  Arlana Lindau 12/11/2020, 1:49 PM  [facility] MAIN [facility] SERVICES [address] [place], [place], [address] Phone: [phone]   Fax:  [phone]  Name: Ann Benson MRN: [id number] Date of Birth: 02/06/1941

## 2020-12-23 ENCOUNTER — Other Ambulatory Visit: Payer: Self-pay

## 2020-12-23 ENCOUNTER — Ambulatory Visit: Payer: Medicare Other | Admitting: Speech Pathology

## 2020-12-23 DIAGNOSIS — R490 Dysphonia: Secondary | ICD-10-CM

## 2020-12-23 NOTE — Therapy (Signed)
[place] [facility] MAIN [facility] SERVICES [address] [place], [place], [address] Phone: [phone]   Fax:  [phone]  Speech Language Pathology Treatment  Patient Details  Name: Ann Benson MRN: [id number] Date of Birth: August 25, 1940 Referring Provider (SLP): Dr. Andee Poles   Encounter Date: 12/23/2020   End of Session - 12/23/20 1320    Visit Number 2    Number of Visits 17    Date for SLP Re-Evaluation 03/11/21    Authorization Type UHC Medicare    Authorization - Visit Number 2    Progress Note Due on Visit 10    SLP Start Time 1102    SLP Stop Time  1158    SLP Time Calculation (min) 56 min    Activity Tolerance Patient tolerated treatment well           Past Medical History:  Diagnosis Date  . Arthritis   . Asthma   . Depression   . GERD (gastroesophageal reflux disease)   . Headache   . History of hiatal hernia   . Hyperlipidemia   . Hyperlipidemia   . Hypertension   . IBS (irritable bowel syndrome)   . Neuromuscular disorder (HCC)    neuropathy    Past Surgical History:  Procedure Laterality Date  . ABDOMINAL HYSTERECTOMY    . APPENDECTOMY    . BREAST BIOPSY Left 07/2013   neg  . CHOLECYSTECTOMY    . COLONOSCOPY N/A 07/08/2020   Procedure: COLONOSCOPY;  Surgeon: Toledo, Boykin Nearing, MD;  Location: [facility] END[facility];  Service: Gastroenterology;  Laterality: N/A;  . ESOPHAGOGASTRODUODENOSCOPY N/A 07/08/2020   Procedure: ESOPHAGOGASTRODUODENOSCOPY (EGD);  Surgeon: Toledo, Boykin Nearing, MD;  Location: [facility];  Service: Gastroenterology;  Laterality: N/A;  . ESOPHAGOGASTRODUODENOSCOPY (EGD) WITH PROPOFOL N/A 03/16/2018   Procedure: ESOPHAGOGASTRODUODENOSCOPY (EGD) WITH PROPOFOL;  Surgeon: Toledo, Boykin Nearing, MD;  Location: [facility] ENDOS[facility];  Service: Gastroenterology;  Laterality: N/A;  . EYE SURGERY     cataracts  . INCISION AND DRAINAGE ABSCESS Right 03/18/2020   Procedure: INCISION AND DRAINAGE ABSCESS-Breast;  Surgeon:  Henrene Dodge, MD;  Location: [facility] ORS;  Service: General;  Laterality: Right;  . KNEE ARTHROSCOPY    . SHOULDER ARTHROSCOPY Left     There were no vitals filed for this visit.   Subjective Assessment - 12/23/20 1310    Subjective "In the afternoon it gets raspy."    Currently in Pain? No/denies                 ADULT SLP TREATMENT - 12/23/20 0001      General Information   Behavior/Cognition Alert;Cooperative;Pleasant mood    HPI Ann Benson is an 80 y.o. female referred by Dr. Andee Poles for dysphonia. Pt reported hoarseness, anosmia, xerostomia, and dysphagia since Covid-19 infection in January 2022. 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 Hx also noted for hiatal hernia, GERD, IBS, COPD. MBS 2 weeks ago showed mild oral dysphagia due to xerostomia, likely primary esophageal dysphagia      Treatment Provided   Treatment provided Cognitive-Linquistic      Pain Assessment   Pain Assessment No/denies pain      Cognitive-Linquistic Treatment   Treatment focused on Voice;Patient/family/caregiver education    Skilled Treatment Patient reported dysphagia to chicken biscuit, feeling this got "stuck" in her throat. Reviewed MBS findings showing WFL pharyngeal swallow and reinforced esophageal precautions, strategies such as alternating solids and liquids, moistening dry foods and avoiding troublesome foods. Trained  patient in neck stretches and supplemental vocal tract relaxation, pt completed stretches with demonstration and min cues. Introduced abdominal breathing and respiratory support for improved phonation and vocal quality. Pt unable to achieve abdominal breathing in upright position, so instructed pt in supine on mat. Pt required moderate cues for breath holding and excessive chest/shoulder movement, fading to min cues after several minutes of training. Introduced hum and patient required mod-max cues to coordinate with breath,  mod cues for shaping of vocal quality/reducing strain. Pt able to attain abdominal breathing briefly in upright position afterwards with tandem breathing, but unable to do so independently.      Assessment / Recommendations / Plan   Plan Continue with current plan of care      Progression Toward Goals   Progression toward goals Progressing toward goals            SLP Education - 12/23/20 1320    Education Details abdominal breathing    Person(s) Educated Patient    Methods Explanation;Demonstration;Tactile cues;Verbal cues;Handout    Comprehension Verbalized understanding;Verbal cues required;Tactile cues required;Need further instruction;Returned demonstration            SLP Short Term Goals - 12/11/20 1040      SLP SHORT TERM GOAL #1   Title Pt will demo HEP for breath support, MTD accurately with rare min cues.    Time 10   sessions   Status New    Target Date 01/10/21      SLP SHORT TERM GOAL #2   Title Patient will use abdominal breathing >90% accuracy in structured sentence level tasks.    Time 10    Period --   sessions   Target Date 01/10/21      SLP SHORT TERM GOAL #3   Title Patient will ID tension/strain >90% accuracy in phrase level tasks.    Time 10    Period --   sessions   Status New    Target Date 01/10/21      SLP SHORT TERM GOAL #4   Title Patient will maintain adequate vocal quality and endurance in 5 minute conversation >85% of the time using abdominal breathing and resonant voice techniques.    Time 10   sessions   Status New    Target Date 01/10/21            SLP Long Term Goals - 12/11/20 1042      SLP LONG TERM GOAL #1   Title Pt will report carryover of 4 vocal hygiene techniques between 3 consecutive sessions.    Time 12   or 17 sessions, for all LTGs   Period Weeks      SLP LONG TERM GOAL #2   Title Patient will maintain adequate vocal quality/endurance in 20 minute conversation using abdominal breathing and/or resonant voice  >85% of the time.    Time 12    Period Weeks    Status New      SLP LONG TERM GOAL #3   Title Patient will report improvement in voice outcome as measured by Voice Related Quality of Life.    Baseline 12/11/20 46/50    Time 12    Period Weeks    Status New      SLP LONG TERM GOAL #4   Time --    Period --    Status --            Plan - 12/23/20 1322    Clinical Impression Statement Ann Benson  continues with mild dysphonia characterized by hoarse, raspy vocal quality, pitch breaks, and intermittent strained vocal quality. ENT reported bowing of vocal cords and significant MTD. Initiated training in laryngeal relaxation and abdominal breathing; patient requires mod-max cues at this time. I recommend skilled ST to train pt in vocal hygiene, improve breath support for voice and reduce laryngeal tension in order to improve vocal quality, endurance, and quality of life.    Speech Therapy Frequency --   1-2 x per week   Duration --   17 total sessions   Treatment/Interventions SLP instruction and feedback;Patient/family education;Cueing hierarchy;Environmental controls;Other (comment)   vocal hygiene, relaxation, resonant voice therapy, abdominal breathing   Potential to Achieve Goals Good    Potential Considerations Ability to learn/carryover information    SLP Home Exercise Plan to be provided next session    Consulted and Agree with Plan of Care Patient           Patient will benefit from skilled therapeutic intervention in order to improve the following deficits and impairments:   Dysphonia    Problem List Patient Active Problem List   Diagnosis Date Noted  . History of 2019 novel coronavirus disease (COVID-19) 09/02/2020  . UTI (urinary tract infection) 04/17/2020  . Diverticulitis large intestine w/o perforation or abscess w/o bleeding 04/09/2020  . Pneumonia 04/09/2020  . Cellulitis 03/17/2020  . Breast abscess of female   . Acute gastroenteritis 03/16/2020  .  Lump of right breast 03/16/2020  . Dystrophic nail 10/26/2019  . Diabetic neuropathy (HCC) 10/26/2019  . Senile osteoporosis 01/26/2018  . Anxiety 01/17/2018  . Hyperlipidemia 01/17/2018  . IBS (irritable bowel syndrome) 01/17/2018  . Intra-abdominal hernia 01/17/2018  . Migraine 01/17/2018  . Lumbar degenerative disc disease 11/30/2017  . Radicular leg pain 09/21/2017  . Lymphedema 05/26/2017  . Leg pain 04/26/2017  . Neuropathy 04/26/2017  . Chronic venous insufficiency 04/26/2017  . PAD (peripheral artery disease) (HCC) 04/26/2017  . Lumbar spondylosis with myelopathy 04/26/2017  . COPD (chronic obstructive pulmonary disease) (HCC) 03/31/2017  . GERD (gastroesophageal reflux disease) 03/31/2017  . Polyneuropathy associated with underlying disease (HCC) 03/04/2017  . Stage 3 chronic kidney disease 03/04/2017  . Acute respiratory failure with hypoxia (HCC) 07/19/2016  . Aortic atherosclerosis (HCC) 04/16/2016  . Claudication (HCC) 04/15/2016  . Recurrent major depressive disorder, in full remission (HCC) 10/10/2015  . Essential hypertension 04/11/2015  . Dysphagia 11/27/2014  . Edema of both legs 12/21/2013   Rondel Baton, MS, CCC-SLP Speech-Language Pathologist  Ann Benson 12/23/2020, 1:25 PM  [place] [facility] MAIN [facility] SERVICES [address] [place], [place], [address] Phone: [phone]   Fax:  [phone]   Name: Ann Benson MRN: [id number] Date of Birth: 11-09-1940

## 2020-12-23 NOTE — Patient Instructions (Signed)
Abdominal Breathing : 15 minutes, twice a day   . Lie on your back: you can use a pillow behind your head and under your knees, or you can sit in your recliner leaned back . Shoulders down - this is a cue to relax . Place your hand on your abdomen - this helps you focus on easy abdominal breath support - the best and most relaxed way to breathe . Breathe in through your nose and fill your belly with air, watching your hand move outward . Breathe out through your mouth and watch your belly move in.    Think of your belly as a balloon, when you fill with air (inhale), the balloon gets bigger. As the air goes out (exhale), the balloon deflates.  If this is going well, you can add a gentle "hmmmmmm" as you exhale. Stop if it sounds raspy.

## 2020-12-25 ENCOUNTER — Ambulatory Visit: Payer: Medicare Other | Admitting: Speech Pathology

## 2020-12-25 ENCOUNTER — Other Ambulatory Visit: Payer: Self-pay

## 2020-12-25 DIAGNOSIS — R490 Dysphonia: Secondary | ICD-10-CM

## 2020-12-25 NOTE — Therapy (Signed)
[place] [facility] MAIN [facility] SERVICES [address] [place], [place], [address] Phone: [phone]   Fax:  [phone]  Speech Language Pathology Treatment  Patient Details  Name: Ann Benson MRN: [id number] Date of Birth: 1940-11-19 Referring Provider (SLP): Dr. Andee Poles   Encounter Date: 12/25/2020   End of Session - 12/25/20 1213    Visit Number 3    Number of Visits 17    Date for SLP Re-Evaluation 03/11/21    Authorization Type [employer] Medicare    Authorization - Visit Number 3    Progress Note Due on Visit 10    SLP Start Time 1102    SLP Stop Time  1157    SLP Time Calculation (min) 55 min    Activity Tolerance Patient tolerated treatment well           Past Medical History:  Diagnosis Date  . Arthritis   . Asthma   . Depression   . GERD (gastroesophageal reflux disease)   . Headache   . History of hiatal hernia   . Hyperlipidemia   . Hyperlipidemia   . Hypertension   . IBS (irritable bowel syndrome)   . Neuromuscular disorder (HCC)    neuropathy    Past Surgical History:  Procedure Laterality Date  . ABDOMINAL HYSTERECTOMY    . APPENDECTOMY    . BREAST BIOPSY Left 07/2013   neg  . CHOLECYSTECTOMY    . COLONOSCOPY N/A 07/08/2020   Procedure: COLONOSCOPY;  Surgeon: Toledo, Boykin Nearing, MD;  Location: [facility] END[facility]COPY;  Service: Gastroenterology;  Laterality: N/A;  . ESOPHAGOGASTRODUODENOSCOPY N/A 07/08/2020   Procedure: ESOPHAGOGASTRODUODENOSCOPY (EGD);  Surgeon: Toledo, Boykin Nearing, MD;  Location: [facility] END[facility];  Service: Gastroenterology;  Laterality: N/A;  . ESOPHAGOGASTRODUODENOSCOPY (EGD) WITH PROPOFOL N/A 03/16/2018   Procedure: ESOPHAGOGASTRODUODENOSCOPY (EGD) WITH PROPOFOL;  Surgeon: Toledo, Boykin Nearing, MD;  Location: [facility] ENDOSCOPY;  Service: Gastroenterology;  Laterality: N/A;  . EYE SURGERY     cataracts  . INCISION AND DRAINAGE ABSCESS Right 03/18/2020   Procedure: INCISION AND DRAINAGE ABSCESS-Breast;  Surgeon:  Henrene Dodge, MD;  Location: [facility] ORS;  Service: General;  Laterality: Right;  . KNEE ARTHROSCOPY    . SHOULDER ARTHROSCOPY Left     There were no vitals filed for this visit.   Subjective Assessment - 12/25/20 1204    Subjective "I practiced my breathing."    Currently in Pain? No/denies                 ADULT SLP TREATMENT - 12/25/20 1204      General Information   Behavior/Cognition Alert;Cooperative;Pleasant mood      Treatment Provided   Treatment provided Cognitive-Linquistic      Pain Assessment   Pain Assessment No/denies pain      Cognitive-Linquistic Treatment   Treatment focused on Voice;Patient/family/caregiver education    Skilled Treatment Patient reported sometimes going outside and "hollering" in the past to try to "fix" her raspy voice. Educated on vocal hygiene and that since ENT noted muscle tension that this is not advised. Encouraged pt to continue practicing her abdominal breathing and that she will continue to learn techniques in therapy to help her use her voice in a healthy way. Able to achieve abdominal breathing upright today with usual max cues (mirror and tactile cues/tandem breathing, with pt's hand on SLP's abdomen). Able to fade cues to minimal after several minutes of tandem breathing. Pt had difficulty coordinating AB with phonation tasks, so shifted focus to using  resonant voicing to reduce throaty focus and improve vocal quality. Progressed from hum (80%) to nasalized vowels (80%) and finally to nasalized words (60% for 1 syllable, 40% for 2 syllable).      Assessment / Recommendations / Plan   Plan Continue with current plan of care      Progression Toward Goals   Progression toward goals Progressing toward goals            SLP Education - 12/25/20 1213    Education Details vocal hygiene, abdominal breathing    Person(s) Educated Patient    Methods Explanation    Comprehension Verbalized understanding;Need further instruction             SLP Short Term Goals - 12/11/20 1040      SLP SHORT TERM GOAL #1   Title Pt will demo HEP for breath support, MTD accurately with rare min cues.    Time 10   sessions   Status New    Target Date 01/10/21      SLP SHORT TERM GOAL #2   Title Patient will use abdominal breathing >90% accuracy in structured sentence level tasks.    Time 10    Period --   sessions   Target Date 01/10/21      SLP SHORT TERM GOAL #3   Title Patient will ID tension/strain >90% accuracy in phrase level tasks.    Time 10    Period --   sessions   Status New    Target Date 01/10/21      SLP SHORT TERM GOAL #4   Title Patient will maintain adequate vocal quality and endurance in 5 minute conversation >85% of the time using abdominal breathing and resonant voice techniques.    Time 10   sessions   Status New    Target Date 01/10/21            SLP Long Term Goals - 12/11/20 1042      SLP LONG TERM GOAL #1   Title Pt will report carryover of 4 vocal hygiene techniques between 3 consecutive sessions.    Time 12   or 17 sessions, for all LTGs   Period Weeks      SLP LONG TERM GOAL #2   Title Patient will maintain adequate vocal quality/endurance in 20 minute conversation using abdominal breathing and/or resonant voice >85% of the time.    Time 12    Period Weeks    Status New      SLP LONG TERM GOAL #3   Title Patient will report improvement in voice outcome as measured by Voice Related Quality of Life.    Baseline 12/11/20 46/50    Time 12    Period Weeks    Status New      SLP LONG TERM GOAL #4   Time --    Period --    Status --            Plan - 12/25/20 1214    Clinical Impression Statement Sherre Scarlet continues with mild dysphonia characterized by hoarse, raspy vocal quality, pitch breaks, and intermittent strained vocal quality. ENT reported bowing of vocal cords and significant MTD. Initiated training in laryngeal relaxation and abdominal breathing; patient  progressing with abdominal breathing in isolation but has difficulty with dual-tasking and coordinating breathing with phonation. Demonstration vs verbal cuing is most effective; shows some promise with resonant voice techniques to improve quality. I recommend skilled ST to train pt in vocal hygiene,  improve breath support for voice and reduce laryngeal tension in order to improve vocal quality, endurance, and quality of life.    Speech Therapy Frequency --   1-2 x per week   Duration --   17 total sessions   Treatment/Interventions SLP instruction and feedback;Patient/family education;Cueing hierarchy;Environmental controls;Other (comment)   vocal hygiene, relaxation, resonant voice therapy, abdominal breathing   Potential to Achieve Goals Good    Potential Considerations Ability to learn/carryover information    SLP Home Exercise Plan to be provided next session    Consulted and Agree with Plan of Care Patient           Patient will benefit from skilled therapeutic intervention in order to improve the following deficits and impairments:   Dysphonia    Problem List Patient Active Problem List   Diagnosis Date Noted  . History of 2019 novel coronavirus disease (COVID-19) 09/02/2020  . UTI (urinary tract infection) 04/17/2020  . Diverticulitis large intestine w/o perforation or abscess w/o bleeding 04/09/2020  . Pneumonia 04/09/2020  . Cellulitis 03/17/2020  . Breast abscess of female   . Acute gastroenteritis 03/16/2020  . Lump of right breast 03/16/2020  . Dystrophic nail 10/26/2019  . Diabetic neuropathy (HCC) 10/26/2019  . Senile osteoporosis 01/26/2018  . Anxiety 01/17/2018  . Hyperlipidemia 01/17/2018  . IBS (irritable bowel syndrome) 01/17/2018  . Intra-abdominal hernia 01/17/2018  . Migraine 01/17/2018  . Lumbar degenerative disc disease 11/30/2017  . Radicular leg pain 09/21/2017  . Lymphedema 05/26/2017  . Leg pain 04/26/2017  . Neuropathy 04/26/2017  . Chronic  venous insufficiency 04/26/2017  . PAD (peripheral artery disease) (HCC) 04/26/2017  . Lumbar spondylosis with myelopathy 04/26/2017  . COPD (chronic obstructive pulmonary disease) (HCC) 03/31/2017  . GERD (gastroesophageal reflux disease) 03/31/2017  . Polyneuropathy associated with underlying disease (HCC) 03/04/2017  . Stage 3 chronic kidney disease 03/04/2017  . Acute respiratory failure with hypoxia (HCC) 07/19/2016  . Aortic atherosclerosis (HCC) 04/16/2016  . Claudication (HCC) 04/15/2016  . Recurrent major depressive disorder, in full remission (HCC) 10/10/2015  . Essential hypertension 04/11/2015  . Dysphagia 11/27/2014  . Edema of both legs 12/21/2013   Rondel Baton, MS, CCC-SLP Speech-Language Pathologist  Arlana Lindau 12/25/2020, 12:17 PM  [facility] [facility] MAIN [facility] SERVICES [address] [place], [place], [address] Phone: [phone]   Fax:  [phone]   Name: TAYLORMARIE REGISTER MRN: [id number] Date of Birth: Mar 23, 1941

## 2020-12-30 ENCOUNTER — Other Ambulatory Visit: Payer: Self-pay

## 2020-12-30 ENCOUNTER — Ambulatory Visit: Payer: Medicare Other | Admitting: Speech Pathology

## 2020-12-30 DIAGNOSIS — R490 Dysphonia: Secondary | ICD-10-CM

## 2020-12-30 NOTE — Therapy (Signed)
[place] [facility] MAIN [facility] SERVICES [address] [place], [place], [address] Phone: [phone]   Fax:  [phone]  Speech Language Pathology Treatment  Patient Details  Name: Ann Benson MRN: [id number] Date of Birth: Sep 11, 1940 Referring Provider (SLP): Dr. Andee Poles   Encounter Date: 12/30/2020   End of Session - 12/30/20 1346    Visit Number 4    Number of Visits 17    Date for SLP Re-Evaluation 03/11/21    Authorization Type [employer] Medicare    Authorization - Visit Number 4    Progress Note Due on Visit 10    SLP Start Time 1100    SLP Stop Time  1158    SLP Time Calculation (min) 58 min    Activity Tolerance Patient tolerated treatment well           Past Medical History:  Diagnosis Date  . Arthritis   . Asthma   . Depression   . GERD (gastroesophageal reflux disease)   . Headache   . History of hiatal hernia   . Hyperlipidemia   . Hyperlipidemia   . Hypertension   . IBS (irritable bowel syndrome)   . Neuromuscular disorder (HCC)    neuropathy    Past Surgical History:  Procedure Laterality Date  . ABDOMINAL HYSTERECTOMY    . APPENDECTOMY    . BREAST BIOPSY Left 07/2013   neg  . CHOLECYSTECTOMY    . COLONOSCOPY N/A 07/08/2020   Procedure: COLONOSCOPY;  Surgeon: Toledo, Boykin Nearing, MD;  Location: [facility];  Service: Gastroenterology;  Laterality: N/A;  . ESOPHAGOGASTRODUODENOSCOPY N/A 07/08/2020   Procedure: ESOPHAGOGASTRODUODENOSCOPY (EGD);  Surgeon: Toledo, Boykin Nearing, MD;  Location: [facility];  Service: Gastroenterology;  Laterality: N/A;  . ESOPHAGOGASTRODUODENOSCOPY (EGD) WITH PROPOFOL N/A 03/16/2018   Procedure: ESOPHAGOGASTRODUODENOSCOPY (EGD) WITH PROPOFOL;  Surgeon: Toledo, Boykin Nearing, MD;  Location: [facility];  Service: Gastroenterology;  Laterality: N/A;  . EYE SURGERY     cataracts  . INCISION AND DRAINAGE ABSCESS Right 03/18/2020   Procedure: INCISION AND DRAINAGE ABSCESS-Breast;  Surgeon:  Henrene Dodge, MD;  Location: [facility] ORS;  Service: General;  Laterality: Right;  . KNEE ARTHROSCOPY    . SHOULDER ARTHROSCOPY Left     There were no vitals filed for this visit.   Subjective Assessment - 12/30/20 1219    Subjective "I was raspy last night."    Currently in Pain? No/denies                 ADULT SLP TREATMENT - 12/30/20 1219      General Information   Behavior/Cognition Alert;Cooperative;Pleasant mood    HPI Ann Benson is an 80 y.o. female referred by Dr. Andee Poles for dysphonia. Pt reported hoarseness, anosmia, xerostomia, and dysphagia since Covid-19 infection in January 2022. 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 Hx also noted for hiatal hernia, GERD, IBS, COPD. 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 Completed neck/supplemental vocal tract stretches with demonstration cues. Focused on using resonant voice techniques to improve vocal quality with hum, then nasalized vowels, and ultimately non-nasalized vowel sounds with usual mod cues.  Initially benefitted from straw phonation to promote forward voice placement. Progressed to nasalized words (80% accuracy with demonstration and mod cues). Continues to improve awareness of hoarse vocal quality/throaty focus in word level tasks ( ~80% of the time, however usual cues necessary to attempt correction). Used hum prior to personally relevant words with mixed success; simplified cuing/used demonstration with ~60% accuracy by end of session.      Assessment / Recommendations / Plan   Plan Continue with current plan of  care      Progression Toward Goals   Progression toward goals Progressing toward goals            SLP Education - 12/30/20 1345    Education Details hum to aid voice placement    Person(s) Educated Patient    Methods Explanation;Demonstration;Verbal cues;Handout    Comprehension Verbalized understanding;Verbal cues required;Need further instruction            SLP Short Term Goals - 12/11/20 1040      SLP SHORT TERM GOAL #1   Title Pt will demo HEP for breath support, MTD accurately with rare min cues.    Time 10   sessions   Status New    Target Date 01/10/21      SLP SHORT TERM GOAL #2   Title Patient will use abdominal breathing >90% accuracy in structured sentence level tasks.    Time 10    Period --   sessions   Target Date 01/10/21      SLP SHORT TERM GOAL #3   Title Patient will ID tension/strain >90% accuracy in phrase level tasks.    Time 10    Period --   sessions   Status New    Target Date 01/10/21      SLP SHORT TERM GOAL #4   Title Patient will maintain adequate vocal quality and endurance in 5 minute conversation >85% of the time using abdominal breathing and resonant voice techniques.    Time 10   sessions   Status New    Target Date 01/10/21            SLP Long Term Goals - 12/11/20 1042      SLP LONG TERM GOAL #1   Title Pt will report carryover of 4 vocal hygiene techniques between 3 consecutive sessions.    Time 12   or 17 sessions, for all LTGs   Period Weeks      SLP LONG TERM GOAL #2   Title Patient will maintain adequate vocal quality/endurance in 20 minute conversation using abdominal breathing and/or resonant voice >85% of the time.    Time 12    Period Weeks    Status New      SLP LONG TERM GOAL #3   Title Patient will report improvement in voice outcome as measured by Voice Related Quality of Life.    Baseline 12/11/20 46/50    Time 12    Period Weeks    Status New      SLP LONG TERM GOAL #4   Time --    Period --     Status --            Plan - 12/30/20 1346    Clinical Impression Statement Juleen Sorrels continues with mild dysphonia characterized by hoarse, raspy vocal quality, pitch breaks, and intermittent strained vocal quality. ENT reported bowing of vocal cords and significant MTD; she has another ENT consult at [facility] later this week. Patient has difficulty  with dual-tasking and benefits from demonstration and simplified cuing; use of hum to promote voice placement vs focus on abdominal breathing. I recommend skilled ST to train pt in vocal hygiene, improve breath support for voice and reduce laryngeal tension in order to improve vocal quality, endurance, and quality of life.    Speech Therapy Frequency --   1-2 x per week   Duration --   17 total sessions   Treatment/Interventions SLP instruction and feedback;Patient/family education;Cueing hierarchy;Environmental controls;Other (comment)   vocal hygiene, relaxation, resonant voice therapy, abdominal breathing   Potential to Achieve Goals Good    Potential Considerations Ability to learn/carryover information    SLP Home Exercise Plan to be provided next session    Consulted and Agree with Plan of Care Patient           Patient will benefit from skilled therapeutic intervention in order to improve the following deficits and impairments:   Dysphonia    Problem List Patient Active Problem List   Diagnosis Date Noted  . History of 2019 novel coronavirus disease (COVID-19) 09/02/2020  . UTI (urinary tract infection) 04/17/2020  . Diverticulitis large intestine w/o perforation or abscess w/o bleeding 04/09/2020  . Pneumonia 04/09/2020  . Cellulitis 03/17/2020  . Breast abscess of female   . Acute gastroenteritis 03/16/2020  . Lump of right breast 03/16/2020  . Dystrophic nail 10/26/2019  . Diabetic neuropathy (HCC) 10/26/2019  . Senile osteoporosis 01/26/2018  . Anxiety 01/17/2018  . Hyperlipidemia 01/17/2018  . IBS (irritable  bowel syndrome) 01/17/2018  . Intra-abdominal hernia 01/17/2018  . Migraine 01/17/2018  . Lumbar degenerative disc disease 11/30/2017  . Radicular leg pain 09/21/2017  . Lymphedema 05/26/2017  . Leg pain 04/26/2017  . Neuropathy 04/26/2017  . Chronic venous insufficiency 04/26/2017  . PAD (peripheral artery disease) (HCC) 04/26/2017  . Lumbar spondylosis with myelopathy 04/26/2017  . COPD (chronic obstructive pulmonary disease) (HCC) 03/31/2017  . GERD (gastroesophageal reflux disease) 03/31/2017  . Polyneuropathy associated with underlying disease (HCC) 03/04/2017  . Stage 3 chronic kidney disease 03/04/2017  . Acute respiratory failure with hypoxia (HCC) 07/19/2016  . Aortic atherosclerosis (HCC) 04/16/2016  . Claudication (HCC) 04/15/2016  . Recurrent major depressive disorder, in full remission (HCC) 10/10/2015  . Essential hypertension 04/11/2015  . Dysphagia 11/27/2014  . Edema of both legs 12/21/2013   Rondel Baton, MS, CCC-SLP Speech-Language Pathologist  Arlana Lindau 12/30/2020, 1:48 PM  [place] [facility] MAIN [facility] SERVICES [address] [place], [place], [address] Phone: [phone]   Fax:  [phone]   Name: JODE LIPPE MRN: [id number] Date of Birth: 03-Jun-1941

## 2021-01-01 ENCOUNTER — Ambulatory Visit: Payer: Medicare Other | Admitting: Speech Pathology

## 2021-01-08 ENCOUNTER — Ambulatory Visit: Payer: Medicare Other | Attending: Otolaryngology | Admitting: Speech Pathology

## 2021-01-08 DIAGNOSIS — R490 Dysphonia: Secondary | ICD-10-CM | POA: Insufficient documentation

## 2021-01-13 ENCOUNTER — Ambulatory Visit: Payer: Medicare Other | Admitting: Speech Pathology

## 2021-01-13 ENCOUNTER — Institutional Professional Consult (permissible substitution): Payer: Medicare Other | Admitting: Pulmonary Disease

## 2021-01-15 ENCOUNTER — Ambulatory Visit: Payer: Medicare Other | Admitting: Speech Pathology

## 2021-01-15 ENCOUNTER — Other Ambulatory Visit: Payer: Self-pay

## 2021-01-15 DIAGNOSIS — R490 Dysphonia: Secondary | ICD-10-CM | POA: Diagnosis not present

## 2021-01-15 NOTE — Therapy (Signed)
[place] [facility] MAIN [facility] SERVICES [address] [place], [place], [address] Phone: [phone]   Fax:  [phone]  Speech Language Pathology Treatment  Patient Details  Name: Ann Benson MRN: [id number] Date of Birth: Feb 14, 1941 Referring Provider (SLP): Dr. Andee Poles   Encounter Date: 01/15/2021   End of Session - 01/15/21 1231    Visit Number 5    Number of Visits 17    Date for SLP Re-Evaluation 03/11/21    Authorization Type [employer] Medicare    Authorization - Visit Number 5    Progress Note Due on Visit 10    SLP Start Time 1100    SLP Stop Time  1155    SLP Time Calculation (min) 55 min    Activity Tolerance Patient tolerated treatment well           Past Medical History:  Diagnosis Date  . Arthritis   . Asthma   . Depression   . GERD (gastroesophageal reflux disease)   . Headache   . History of hiatal hernia   . Hyperlipidemia   . Hyperlipidemia   . Hypertension   . IBS (irritable bowel syndrome)   . Neuromuscular disorder (HCC)    neuropathy    Past Surgical History:  Procedure Laterality Date  . ABDOMINAL HYSTERECTOMY    . APPENDECTOMY    . BREAST BIOPSY Left 07/2013   neg  . CHOLECYSTECTOMY    . COLONOSCOPY N/A 07/08/2020   Procedure: COLONOSCOPY;  Surgeon: Toledo, Boykin Nearing, MD;  Location: [facility] ENDOSCOPY;  Service: Gastroenterology;  Laterality: N/A;  . ESOPHAGOGASTRODUODENOSCOPY N/A 07/08/2020   Procedure: ESOPHAGOGASTRODUODENOSCOPY (EGD);  Surgeon: Toledo, Boykin Nearing, MD;  Location: [facility];  Service: Gastroenterology;  Laterality: N/A;  . ESOPHAGOGASTRODUODENOSCOPY (EGD) WITH PROPOFOL N/A 03/16/2018   Procedure: ESOPHAGOGASTRODUODENOSCOPY (EGD) WITH PROPOFOL;  Surgeon: Toledo, Boykin Nearing, MD;  Location: [facility] ENDOSCOPY;  Service: Gastroenterology;  Laterality: N/A;  . EYE SURGERY     cataracts  . INCISION AND DRAINAGE ABSCESS Right 03/18/2020   Procedure: INCISION AND DRAINAGE ABSCESS-Breast;  Surgeon:  Henrene Dodge, MD;  Location: [facility] ORS;  Service: General;  Laterality: Right;  . KNEE ARTHROSCOPY    . SHOULDER ARTHROSCOPY Left     There were no vitals filed for this visit.   Subjective Assessment - 01/15/21 1105    Subjective "They changed my appointment to the 29th"    Currently in Pain? No/denies                 ADULT SLP TREATMENT - 01/15/21 1222      General Information   Behavior/Cognition Alert;Cooperative;Pleasant mood    HPI Ann Benson is an 80 y.o. female referred by Dr. Andee Poles for dysphonia. Pt reported hoarseness, anosmia, xerostomia, and dysphagia since Covid-19 infection in January 2022. 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 Hx also noted for hiatal hernia, GERD, IBS, COPD. MBS 2 weeks ago showed mild oral dysphagia due to xerostomia, likely primary esophageal dysphagia      Treatment Provided   Treatment provided Cognitive-Linquistic      Pain Assessment   Pain Assessment No/denies pain      Cognitive-Linquistic Treatment   Treatment focused on Voice;Patient/family/caregiver education    Skilled Treatment ENT appointment was rescheduled. Reports she received comments from a family member that her voice is improving, but feels it is scratchy today. Rated at 5/10 at beginning of session (with 10 being "normal voice"). Patient required demonstration/tactile  cues to avoid secondary tension during neck and vocal tract relaxation exercises; benefitted from use of mirror for awareness. Used hum and nasalized words to promote awareness and improved resonant focus/vocal quality. 80% accuracy with carryover to personally relevant words/phrases (non-nasalized). Progressed to phrases and sentences, 80% accuracy with imitation, occasional moderate cues. Initially cues required to avoid throat clearing and use hum or "num" when her voice became "scratchy," but was doing so independently by end of session (with carryover  x2 in spontaneous conversation). At end of session pt noted improvement in her voice and rated as 7/10 (with 10 being her normal voice).      Assessment / Recommendations / Plan   Plan Continue with current plan of care      Progression Toward Goals   Progression toward goals Progressing toward goals            SLP Education - 01/15/21 1231    Education Details avoid throat clearing, hum to correct scratchy voice    Person(s) Educated Patient    Methods Explanation;Demonstration;Verbal cues;Handout    Comprehension Verbalized understanding;Returned demonstration;Verbal cues required;Need further instruction            SLP Short Term Goals - 12/11/20 1040      SLP SHORT TERM GOAL #1   Title Pt will demo HEP for breath support, MTD accurately with rare min cues.    Time 10   sessions   Status New    Target Date 01/10/21      SLP SHORT TERM GOAL #2   Title Patient will use abdominal breathing >90% accuracy in structured sentence level tasks.    Time 10    Period --   sessions   Target Date 01/10/21      SLP SHORT TERM GOAL #3   Title Patient will ID tension/strain >90% accuracy in phrase level tasks.    Time 10    Period --   sessions   Status New    Target Date 01/10/21      SLP SHORT TERM GOAL #4   Title Patient will maintain adequate vocal quality and endurance in 5 minute conversation >85% of the time using abdominal breathing and resonant voice techniques.    Time 10   sessions   Status New    Target Date 01/10/21            SLP Long Term Goals - 12/11/20 1042      SLP LONG TERM GOAL #1   Title Pt will report carryover of 4 vocal hygiene techniques between 3 consecutive sessions.    Time 12   or 17 sessions, for all LTGs   Period Weeks      SLP LONG TERM GOAL #2   Title Patient will maintain adequate vocal quality/endurance in 20 minute conversation using abdominal breathing and/or resonant voice >85% of the time.    Time 12    Period Weeks    Status  New      SLP LONG TERM GOAL #3   Title Patient will report improvement in voice outcome as measured by Voice Related Quality of Life.    Baseline 12/11/20 46/50    Time 12    Period Weeks    Status New      SLP LONG TERM GOAL #4   Time --    Period --    Status --            Plan - 01/15/21 1232    Clinical  Impression Statement Ann Benson continues with mild dysphonia characterized by hoarse, raspy vocal quality, pitch breaks, and intermittent strained vocal quality. ENT reported bowing of vocal cords and significant MTD; she has another ENT consult at [facility] at the end of June. Suspect memory deficits which may present barrier to carryover (difficulty with immediate recall/imitation of sentences, keeping up with appointments, and recalling verbal instruction/education from previous sessions). Pt responsive to resonant voice tasks in session today and pt/SLP noted improvements in vocal quality by end of session. Continue to focus on resonant voicing vs abdominal breathing as this has been most effective in prior sessions.  I recommend skilled ST to train pt in vocal hygiene, improve breath support for voice and reduce laryngeal tension in order to improve vocal quality, endurance, and quality of life.    Speech Therapy Frequency --   1-2 x per week   Duration --   17 total sessions   Treatment/Interventions SLP instruction and feedback;Patient/family education;Cueing hierarchy;Environmental controls;Other (comment)   vocal hygiene, relaxation, resonant voice therapy, abdominal breathing   Potential to Achieve Goals Good    Potential Considerations Ability to learn/carryover information    SLP Home Exercise Plan to be provided next session    Consulted and Agree with Plan of Care Patient           Patient will benefit from skilled therapeutic intervention in order to improve the following deficits and impairments:   Dysphonia    Problem List Patient Active Problem List    Diagnosis Date Noted  . History of 2019 novel coronavirus disease (COVID-19) 09/02/2020  . UTI (urinary tract infection) 04/17/2020  . Diverticulitis large intestine w/o perforation or abscess w/o bleeding 04/09/2020  . Pneumonia 04/09/2020  . Cellulitis 03/17/2020  . Breast abscess of female   . Acute gastroenteritis 03/16/2020  . Lump of right breast 03/16/2020  . Dystrophic nail 10/26/2019  . Diabetic neuropathy (HCC) 10/26/2019  . Senile osteoporosis 01/26/2018  . Anxiety 01/17/2018  . Hyperlipidemia 01/17/2018  . IBS (irritable bowel syndrome) 01/17/2018  . Intra-abdominal hernia 01/17/2018  . Migraine 01/17/2018  . Lumbar degenerative disc disease 11/30/2017  . Radicular leg pain 09/21/2017  . Lymphedema 05/26/2017  . Leg pain 04/26/2017  . Neuropathy 04/26/2017  . Chronic venous insufficiency 04/26/2017  . PAD (peripheral artery disease) (HCC) 04/26/2017  . Lumbar spondylosis with myelopathy 04/26/2017  . COPD (chronic obstructive pulmonary disease) (HCC) 03/31/2017  . GERD (gastroesophageal reflux disease) 03/31/2017  . Polyneuropathy associated with underlying disease (HCC) 03/04/2017  . Stage 3 chronic kidney disease 03/04/2017  . Acute respiratory failure with hypoxia (HCC) 07/19/2016  . Aortic atherosclerosis (HCC) 04/16/2016  . Claudication (HCC) 04/15/2016  . Recurrent major depressive disorder, in full remission (HCC) 10/10/2015  . Essential hypertension 04/11/2015  . Dysphagia 11/27/2014  . Edema of both legs 12/21/2013   Rondel Baton, MS, CCC-SLP Speech-Language Pathologist  Arlana Lindau 01/15/2021, 12:34 PM  [place] [facility] MAIN [facility] SERVICES [address] [place], [place], [address] Phone: [phone]   Fax:  [phone]   Name: Ann Benson MRN: [id number] Date of Birth: 06-27-1941

## 2021-01-20 ENCOUNTER — Ambulatory Visit: Payer: Medicare Other | Admitting: Speech Pathology

## 2021-01-20 ENCOUNTER — Other Ambulatory Visit: Payer: Self-pay

## 2021-01-20 DIAGNOSIS — R490 Dysphonia: Secondary | ICD-10-CM

## 2021-01-20 NOTE — Therapy (Signed)
[place] [facility] MAIN [facility] SERVICES [address] [place], [place], [address] Phone: [phone]   Fax:  [phone]  Speech Language Pathology Treatment  Patient Details  Name: Ann Benson MRN: [id number] Date of Birth: 01-13-1941 Referring Provider (SLP): Dr. Andee Poles   Encounter Date: 01/20/2021   End of Session - 01/20/21 1607     Visit Number 6    Number of Visits 17    Date for SLP Re-Evaluation 03/11/21    Authorization Type [employer] Medicare    Authorization - Visit Number 6    Progress Note Due on Visit 10    SLP Start Time 1100    SLP Stop Time  1200    SLP Time Calculation (min) 60 min    Activity Tolerance Patient tolerated treatment well             Past Medical History:  Diagnosis Date   Arthritis    Asthma    Depression    GERD (gastroesophageal reflux disease)    Headache    History of hiatal hernia    Hyperlipidemia    Hyperlipidemia    Hypertension    IBS (irritable bowel syndrome)    Neuromuscular disorder (HCC)    neuropathy    Past Surgical History:  Procedure Laterality Date   ABDOMINAL HYSTERECTOMY     APPENDECTOMY     BREAST BIOPSY Left 07/2013   neg   CHOLECYSTECTOMY     COLONOSCOPY N/A 07/08/2020   Procedure: COLONOSCOPY;  Surgeon: Toledo, Boykin Nearing, MD;  Location: [facility];  Service: Gastroenterology;  Laterality: N/A;   ESOPHAGOGASTRODUODENOSCOPY N/A 07/08/2020   Procedure: ESOPHAGOGASTRODUODENOSCOPY (EGD);  Surgeon: Toledo, Boykin Nearing, MD;  Location: [facility];  Service: Gastroenterology;  Laterality: N/A;   ESOPHAGOGASTRODUODENOSCOPY (EGD) WITH PROPOFOL N/A 03/16/2018   Procedure: ESOPHAGOGASTRODUODENOSCOPY (EGD) WITH PROPOFOL;  Surgeon: Toledo, Boykin Nearing, MD;  Location: [facility];  Service: Gastroenterology;  Laterality: N/A;   EYE SURGERY     cataracts   INCISION AND DRAINAGE ABSCESS Right 03/18/2020   Procedure: INCISION AND DRAINAGE ABSCESS-Breast;  Surgeon: Henrene Dodge, MD;   Location: [facility] ORS;  Service: General;  Laterality: Right;   KNEE ARTHROSCOPY     SHOULDER ARTHROSCOPY Left     There were no vitals filed for this visit.   Subjective Assessment - 01/20/21 1600     Subjective "She said it sounds better."    Currently in Pain? No/denies                   ADULT SLP TREATMENT - 01/20/21 1600       General Information   Behavior/Cognition Alert;Cooperative;Pleasant mood    HPI Ann Benson is an 80 y.o. female referred by Dr. Andee Poles for dysphonia. Pt reported hoarseness, anosmia, xerostomia, and dysphagia since Covid-19 infection in January 2022. 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 Hx also noted for hiatal hernia, GERD, IBS, COPD. MBS 2 weeks ago showed mild oral dysphagia due to xerostomia, likely primary esophageal dysphagia      Treatment Provided   Treatment provided Cognitive-Linquistic      Pain Assessment   Pain Assessment No/denies pain      Cognitive-Linquistic Treatment   Treatment focused on Voice;Patient/family/caregiver education    Skilled Treatment Patient reports ongoing practice at home; required min-mod cues (verbal, demonstration) when completing neck stretches and vocal tract relaxation. Progressed through home exercise tasks (resonant words, hum->personally relevant words and phrases) accuracy 85%  with resonant voicing. Attempted some sustained phonation tasks with mixed success; maintained good vocal quality with /ma/ prolongation 70% accuracy, average duration 6.3 seconds, pitch glides ("ma-ma") ranging 197-344 Hz with usual mod cues. Progressed to sentence level reading task with pt maintaining vocal quality ~70% of the time; awareness of "scratchy" or "squeaky" voice ~70%.      Assessment / Recommendations / Plan   Plan Continue with current plan of care      Progression Toward Goals   Progression toward goals Progressing toward goals              SLP  Education - 01/20/21 1607     Education Details how breathing can affect voice    Person(s) Educated Patient    Methods Explanation    Comprehension Verbalized understanding;Need further instruction              SLP Short Term Goals - 12/11/20 1040       SLP SHORT TERM GOAL #1   Title Pt will demo HEP for breath support, MTD accurately with rare min cues.    Time 10   sessions   Status New    Target Date 01/10/21      SLP SHORT TERM GOAL #2   Title Patient will use abdominal breathing >90% accuracy in structured sentence level tasks.    Time 10    Period --   sessions   Target Date 01/10/21      SLP SHORT TERM GOAL #3   Title Patient will ID tension/strain >90% accuracy in phrase level tasks.    Time 10    Period --   sessions   Status New    Target Date 01/10/21      SLP SHORT TERM GOAL #4   Title Patient will maintain adequate vocal quality and endurance in 5 minute conversation >85% of the time using abdominal breathing and resonant voice techniques.    Time 10   sessions   Status New    Target Date 01/10/21              SLP Long Term Goals - 12/11/20 1042       SLP LONG TERM GOAL #1   Title Pt will report carryover of 4 vocal hygiene techniques between 3 consecutive sessions.    Time 12   or 17 sessions, for all LTGs   Period Weeks      SLP LONG TERM GOAL #2   Title Patient will maintain adequate vocal quality/endurance in 20 minute conversation using abdominal breathing and/or resonant voice >85% of the time.    Time 12    Period Weeks    Status New      SLP LONG TERM GOAL #3   Title Patient will report improvement in voice outcome as measured by Voice Related Quality of Life.    Baseline 12/11/20 46/50    Time 12    Period Weeks    Status New      SLP LONG TERM GOAL #4   Time --    Period --    Status --              Plan - 01/20/21 1608     Clinical Impression Statement Ann Benson continues with mild dysphonia characterized  by hoarse, raspy vocal quality, pitch breaks, and intermittent strained vocal quality. ENT reported bowing of vocal cords and significant MTD; she has another ENT consult at [facility] at the end of June. Appears to  have some memory difficulties which may present barrier to carryover (difficulty with immediate recall/imitation of sentences, keeping up with appointments, and recalling verbal instruction/education from previous sessions). Pt remains responsive to resonant voice tasks; continue to focus on resonant voicing vs abdominal breathing as this has been most effective in prior sessions.  I recommend skilled ST to train pt in vocal hygiene, improve breath support for voice and reduce laryngeal tension in order to improve vocal quality, endurance, and quality of life.    Speech Therapy Frequency --   1-2 x per week   Duration --   17 total sessions   Treatment/Interventions SLP instruction and feedback;Patient/family education;Cueing hierarchy;Environmental controls;Other (comment)   vocal hygiene, relaxation, resonant voice therapy, abdominal breathing   Potential to Achieve Goals Good    Potential Considerations Ability to learn/carryover information    SLP Home Exercise Plan to be provided next session    Consulted and Agree with Plan of Care Patient             Patient will benefit from skilled therapeutic intervention in order to improve the following deficits and impairments:   Dysphonia    Problem List Patient Active Problem List   Diagnosis Date Noted   History of 2019 novel coronavirus disease (COVID-19) 09/02/2020   UTI (urinary tract infection) 04/17/2020   Diverticulitis large intestine w/o perforation or abscess w/o bleeding 04/09/2020   Pneumonia 04/09/2020   Cellulitis 03/17/2020   Breast abscess of female    Acute gastroenteritis 03/16/2020   Lump of right breast 03/16/2020   Dystrophic nail 10/26/2019   Diabetic neuropathy (HCC) 10/26/2019   Senile osteoporosis  01/26/2018   Anxiety 01/17/2018   Hyperlipidemia 01/17/2018   IBS (irritable bowel syndrome) 01/17/2018   Intra-abdominal hernia 01/17/2018   Migraine 01/17/2018   Lumbar degenerative disc disease 11/30/2017   Radicular leg pain 09/21/2017   Lymphedema 05/26/2017   Leg pain 04/26/2017   Neuropathy 04/26/2017   Chronic venous insufficiency 04/26/2017   PAD (peripheral artery disease) (HCC) 04/26/2017   Lumbar spondylosis with myelopathy 04/26/2017   COPD (chronic obstructive pulmonary disease) (HCC) 03/31/2017   GERD (gastroesophageal reflux disease) 03/31/2017   Polyneuropathy associated with underlying disease (HCC) 03/04/2017   Stage 3 chronic kidney disease 03/04/2017   Acute respiratory failure with hypoxia (HCC) 07/19/2016   Aortic atherosclerosis (HCC) 04/16/2016   Claudication (HCC) 04/15/2016   Recurrent major depressive disorder, in full remission (HCC) 10/10/2015   Essential hypertension 04/11/2015   Dysphagia 11/27/2014   Edema of both legs 12/21/2013   Ann Baton, MS, CCC-SLP Speech-Language Pathologist  Arlana Lindau 01/20/2021, 4:09 PM  [place] [facility] MAIN [facility] SERVICES [address] [place], [place], [address] Phone: [phone]   Fax:  [phone]   Name: JAMARIYAH JOHANNSEN MRN: [id number] Date of Birth: Jan 22, 1941

## 2021-01-22 ENCOUNTER — Other Ambulatory Visit: Payer: Self-pay

## 2021-01-22 ENCOUNTER — Ambulatory Visit: Payer: Medicare Other | Admitting: Speech Pathology

## 2021-01-22 DIAGNOSIS — R490 Dysphonia: Secondary | ICD-10-CM

## 2021-01-22 NOTE — Therapy (Signed)
[facility] MAIN [facility] SERVICES [address] [place], [place], [address] Phone: [phone]   Fax:  [phone]  Speech Language Pathology Treatment  Patient Details  Name: Ann Benson MRN: [id number] Date of Birth: 02/09/1941 Referring Provider (SLP): Dr. Andee Poles   Encounter Date: 01/22/2021   End of Session - 01/22/21 1528     Visit Number 7    Number of Visits 17    Date for SLP Re-Evaluation 03/11/21    Authorization Type [employer] Medicare    Authorization - Visit Number 7    Progress Note Due on Visit 10    SLP Start Time 1100    SLP Stop Time  1155    SLP Time Calculation (min) 55 min    Activity Tolerance Patient tolerated treatment well             Past Medical History:  Diagnosis Date   Arthritis    Asthma    Depression    GERD (gastroesophageal reflux disease)    Headache    History of hiatal hernia    Hyperlipidemia    Hyperlipidemia    Hypertension    IBS (irritable bowel syndrome)    Neuromuscular disorder (HCC)    neuropathy    Past Surgical History:  Procedure Laterality Date   ABDOMINAL HYSTERECTOMY     APPENDECTOMY     BREAST BIOPSY Left 07/2013   neg   CHOLECYSTECTOMY     COLONOSCOPY N/A 07/08/2020   Procedure: COLONOSCOPY;  Surgeon: Toledo, Boykin Nearing, MD;  Location: [facility];  Service: Gastroenterology;  Laterality: N/A;   ESOPHAGOGASTRODUODENOSCOPY N/A 07/08/2020   Procedure: ESOPHAGOGASTRODUODENOSCOPY (EGD);  Surgeon: Toledo, Boykin Nearing, MD;  Location: [facility];  Service: Gastroenterology;  Laterality: N/A;   ESOPHAGOGASTRODUODENOSCOPY (EGD) WITH PROPOFOL N/A 03/16/2018   Procedure: ESOPHAGOGASTRODUODENOSCOPY (EGD) WITH PROPOFOL;  Surgeon: Toledo, Boykin Nearing, MD;  Location: [facility];  Service: Gastroenterology;  Laterality: N/A;   EYE SURGERY     cataracts   INCISION AND DRAINAGE ABSCESS Right 03/18/2020   Procedure: INCISION AND DRAINAGE ABSCESS-Breast;  Surgeon: Henrene Dodge, MD;   Location: [facility] ORS;  Service: General;  Laterality: Right;   KNEE ARTHROSCOPY     SHOULDER ARTHROSCOPY Left     There were no vitals filed for this visit.   Subjective Assessment - 01/22/21 1206     Subjective Pt enters treatment room with [facility] voicing    Currently in Pain? No/denies                   ADULT SLP TREATMENT - 01/22/21 1206       General Information   Behavior/Cognition Alert;Cooperative;Pleasant mood      Treatment Provided   Treatment provided Cognitive-Linquistic      Pain Assessment   Pain Assessment No/denies pain      Cognitive-Linquistic Treatment   Treatment focused on Voice;Patient/family/caregiver education    Skilled Treatment Patient continues to report feedback from family/friends that her voice sounds better. Continues to require cues (verbal, demonstration) with aspects of HEP reviewed previously. For neck and vocal tract relaxation stretches, patient required reminder that she is to complete instructions as written on handout, not read instructions aloud. Modified patient handout to reflect this. Patient completed resonant words and personally relevant words/phrases with good vocal quality >90% accuracy. Sustained voicing (/ma/) with duration of 6.5 seconds, mod cues/imitation. Pitch glides ("ma-ma" ranging 210-359 Hz) with min cues. Attempts to elicit greater intensity resulted in strained vocal quality. Simple  conversations 5-8 minutes with pt maintaining good quality voicing ~85% of the time). 3 throat clears today; continue to educate on vocal hygiene      Assessment / Recommendations / Plan   Plan Continue with current plan of care      Progression Toward Goals   Progression toward goals Progressing toward goals              SLP Education - 01/22/21 1527     Education Details vocal hygiene              SLP Short Term Goals - 12/11/20 1040       SLP SHORT TERM GOAL #1   Title Pt will demo HEP for breath support, MTD  accurately with rare min cues.    Time 10   sessions   Status New    Target Date 01/10/21      SLP SHORT TERM GOAL #2   Title Patient will use abdominal breathing >90% accuracy in structured sentence level tasks.    Time 10    Period --   sessions   Target Date 01/10/21      SLP SHORT TERM GOAL #3   Title Patient will ID tension/strain >90% accuracy in phrase level tasks.    Time 10    Period --   sessions   Status New    Target Date 01/10/21      SLP SHORT TERM GOAL #4   Title Patient will maintain adequate vocal quality and endurance in 5 minute conversation >85% of the time using abdominal breathing and resonant voice techniques.    Time 10   sessions   Status New    Target Date 01/10/21              SLP Long Term Goals - 12/11/20 1042       SLP LONG TERM GOAL #1   Title Pt will report carryover of 4 vocal hygiene techniques between 3 consecutive sessions.    Time 12   or 17 sessions, for all LTGs   Period Weeks      SLP LONG TERM GOAL #2   Title Patient will maintain adequate vocal quality/endurance in 20 minute conversation using abdominal breathing and/or resonant voice >85% of the time.    Time 12    Period Weeks    Status New      SLP LONG TERM GOAL #3   Title Patient will report improvement in voice outcome as measured by Voice Related Quality of Life.    Baseline 12/11/20 46/50    Time 12    Period Weeks    Status New      SLP LONG TERM GOAL #4   Time --    Period --    Status --              Plan - 01/22/21 1528     Clinical Impression Statement Patient with improved voicing today; minimal dysphonia in short conversations. Appears to have some memory difficulties which may present barrier to carryover (difficulty with immediate recall/imitation of sentences, keeping up with appointments, and recalling verbal instruction/education from previous sessions). Pt remains responsive to resonant voice tasks; continue to focus on resonant voicing vs  abdominal breathing as this has been most effective in prior sessions.  I recommend skilled ST to train pt in vocal hygiene, improve breath support for voice and reduce laryngeal tension in order to improve vocal quality, endurance, and quality of life.  Speech Therapy Frequency --   1-2 x per week   Duration --   17 total sessions   Treatment/Interventions SLP instruction and feedback;Patient/family education;Cueing hierarchy;Environmental controls;Other (comment)   vocal hygiene, relaxation, resonant voice therapy, abdominal breathing   Potential to Achieve Goals Good    Potential Considerations Ability to learn/carryover information    SLP Home Exercise Plan to be provided next session    Consulted and Agree with Plan of Care Patient             Patient will benefit from skilled therapeutic intervention in order to improve the following deficits and impairments:   Dysphonia    Problem List Patient Active Problem List   Diagnosis Date Noted   History of 2019 novel coronavirus disease (COVID-19) 09/02/2020   UTI (urinary tract infection) 04/17/2020   Diverticulitis large intestine w/o perforation or abscess w/o bleeding 04/09/2020   Pneumonia 04/09/2020   Cellulitis 03/17/2020   Breast abscess of female    Acute gastroenteritis 03/16/2020   Lump of right breast 03/16/2020   Dystrophic nail 10/26/2019   Diabetic neuropathy (HCC) 10/26/2019   Senile osteoporosis 01/26/2018   Anxiety 01/17/2018   Hyperlipidemia 01/17/2018   IBS (irritable bowel syndrome) 01/17/2018   Intra-abdominal hernia 01/17/2018   Migraine 01/17/2018   Lumbar degenerative disc disease 11/30/2017   Radicular leg pain 09/21/2017   Lymphedema 05/26/2017   Leg pain 04/26/2017   Neuropathy 04/26/2017   Chronic venous insufficiency 04/26/2017   PAD (peripheral artery disease) (HCC) 04/26/2017   Lumbar spondylosis with myelopathy 04/26/2017   COPD (chronic obstructive pulmonary disease) (HCC) 03/31/2017    GERD (gastroesophageal reflux disease) 03/31/2017   Polyneuropathy associated with underlying disease (HCC) 03/04/2017   Stage 3 chronic kidney disease 03/04/2017   Acute respiratory failure with hypoxia (HCC) 07/19/2016   Aortic atherosclerosis (HCC) 04/16/2016   Claudication (HCC) 04/15/2016   Recurrent major depressive disorder, in full remission (HCC) 10/10/2015   Essential hypertension 04/11/2015   Dysphagia 11/27/2014   Edema of both legs 12/21/2013   Rondel Baton, MS, CCC-SLP Speech-Language Pathologist   Arlana Lindau 01/22/2021, 3:31 PM  [place] [facility] MAIN [facility] SERVICES [address] [place], [place], [address] Phone: [phone]   Fax:  [phone]   Name: Ann Benson MRN: [id number] Date of Birth: 1940-11-04

## 2021-01-27 ENCOUNTER — Ambulatory Visit: Payer: Medicare Other | Admitting: Speech Pathology

## 2021-01-27 ENCOUNTER — Other Ambulatory Visit: Payer: Self-pay

## 2021-01-27 DIAGNOSIS — R490 Dysphonia: Secondary | ICD-10-CM

## 2021-01-28 ENCOUNTER — Encounter: Payer: Self-pay | Admitting: Speech Pathology

## 2021-01-28 NOTE — Therapy (Signed)
[facility] MAIN [facility] SERVICES [address] [place], [place], [address] Phone: [phone]   Fax:  [phone]  Speech Language Pathology Treatment  Patient Details  Name: Ann Benson MRN: [id number] Date of Birth: June 09, 1941 Referring Provider (SLP): Dr. Andee Poles   Encounter Date: 01/27/2021   End of Session - 01/28/21 1512     Visit Number 8    Number of Visits 17    Date for SLP Re-Evaluation 03/11/21    Authorization Type [employer] Medicare    Authorization - Visit Number 8    Progress Note Due on Visit 10    SLP Start Time 1100    SLP Stop Time  1150    SLP Time Calculation (min) 50 min    Activity Tolerance Patient tolerated treatment well             Past Medical History:  Diagnosis Date   Arthritis    Asthma    Depression    GERD (gastroesophageal reflux disease)    Headache    History of hiatal hernia    Hyperlipidemia    Hyperlipidemia    Hypertension    IBS (irritable bowel syndrome)    Neuromuscular disorder (HCC)    neuropathy    Past Surgical History:  Procedure Laterality Date   ABDOMINAL HYSTERECTOMY     APPENDECTOMY     BREAST BIOPSY Left 07/2013   neg   CHOLECYSTECTOMY     COLONOSCOPY N/A 07/08/2020   Procedure: COLONOSCOPY;  Surgeon: Toledo, Boykin Nearing, MD;  Location: [facility];  Service: Gastroenterology;  Laterality: N/A;   ESOPHAGOGASTRODUODENOSCOPY N/A 07/08/2020   Procedure: ESOPHAGOGASTRODUODENOSCOPY (EGD);  Surgeon: Toledo, Boykin Nearing, MD;  Location: [facility];  Service: Gastroenterology;  Laterality: N/A;   ESOPHAGOGASTRODUODENOSCOPY (EGD) WITH PROPOFOL N/A 03/16/2018   Procedure: ESOPHAGOGASTRODUODENOSCOPY (EGD) WITH PROPOFOL;  Surgeon: Toledo, Boykin Nearing, MD;  Location: [facility];  Service: Gastroenterology;  Laterality: N/A;   EYE SURGERY     cataracts   INCISION AND DRAINAGE ABSCESS Right 03/18/2020   Procedure: INCISION AND DRAINAGE ABSCESS-Breast;  Surgeon: Henrene Dodge, MD;   Location: [facility] ORS;  Service: General;  Laterality: Right;   KNEE ARTHROSCOPY     SHOULDER ARTHROSCOPY Left     There were no vitals filed for this visit.   Subjective Assessment - 01/28/21 1511     Subjective Patient attentive and cooperative with different clinicican                   ADULT SLP TREATMENT - 01/28/21 0001       General Information   Behavior/Cognition Alert;Cooperative;Pleasant mood    HPI Ann Benson is an 80 y.o. female referred by Dr. Andee Poles for dysphonia. Pt reported hoarseness, anosmia, xerostomia, and dysphagia since Covid-19 infection in January 2022. 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 Hx also noted for hiatal hernia, GERD, IBS, COPD. MBS 2 weeks ago showed mild oral dysphagia due to xerostomia, likely primary esophageal dysphagia      Treatment Provided   Treatment provided Cognitive-Linquistic      Pain Assessment   Pain Assessment No/denies pain      Cognitive-Linquistic Treatment   Treatment focused on Voice;Patient/family/caregiver education    Skilled Treatment Patient continues to report feedback from family/friends that her voice sounds better. Continues to require cues (verbal, demonstration) with aspects of HEP reviewed previously. For neck and vocal tract relaxation stretches, patient required reminder that she is to  complete instructions as written on handout, not read instructions aloud. Patient completed resonant words and personally relevant words/phrases with good vocal quality >90% accuracy. Sustained voicing (/ma/) with duration of 6.5 seconds, mod cues/imitation. Pitch glides ("ma-ma" ranging 210-359 Hz) with min cues. Attempts to elicit greater intensity resulted in strained vocal quality. Simple conversations 5-8 minutes with pt maintaining good quality voicing ~85% of the time). 3 throat clears today; continue to educate on vocal hygiene      Assessment / Recommendations  / Plan   Plan Continue with current plan of care      Progression Toward Goals   Progression toward goals Progressing toward goals              SLP Education - 01/28/21 1512     Education Details vocal loudness via modeling    Person(s) Educated Patient    Methods Explanation    Comprehension Verbalized understanding;Need further instruction              SLP Short Term Goals - 12/11/20 1040       SLP SHORT TERM GOAL #1   Title Pt will demo HEP for breath support, MTD accurately with rare min cues.    Time 10   sessions   Status New    Target Date 01/10/21      SLP SHORT TERM GOAL #2   Title Patient will use abdominal breathing >90% accuracy in structured sentence level tasks.    Time 10    Period --   sessions   Target Date 01/10/21      SLP SHORT TERM GOAL #3   Title Patient will ID tension/strain >90% accuracy in phrase level tasks.    Time 10    Period --   sessions   Status New    Target Date 01/10/21      SLP SHORT TERM GOAL #4   Title Patient will maintain adequate vocal quality and endurance in 5 minute conversation >85% of the time using abdominal breathing and resonant voice techniques.    Time 10   sessions   Status New    Target Date 01/10/21              SLP Long Term Goals - 12/11/20 1042       SLP LONG TERM GOAL #1   Title Pt will report carryover of 4 vocal hygiene techniques between 3 consecutive sessions.    Time 12   or 17 sessions, for all LTGs   Period Weeks      SLP LONG TERM GOAL #2   Title Patient will maintain adequate vocal quality/endurance in 20 minute conversation using abdominal breathing and/or resonant voice >85% of the time.    Time 12    Period Weeks    Status New      SLP LONG TERM GOAL #3   Title Patient will report improvement in voice outcome as measured by Voice Related Quality of Life.    Baseline 12/11/20 46/50    Time 12    Period Weeks    Status New      SLP LONG TERM GOAL #4   Time --    Period  --    Status --              Plan - 01/28/21 1513     Clinical Impression Statement Patient with improved voicing today; minimal dysphonia in short conversations. Appears to have some memory difficulties which may present barrier to carryover (  difficulty with immediate recall/imitation of sentences, keeping up with appointments, and recalling verbal instruction/education from previous sessions). Pt remains responsive to resonant voice tasks; continue to focus on resonant voicing vs abdominal breathing as this has been most effective in prior sessions.  I recommend skilled ST to train pt in vocal hygiene, improve breath support for voice and reduce laryngeal tension in order to improve vocal quality, endurance, and quality of life.    Speech Therapy Frequency --   1-2 times per week   Duration --   17 sessions   Treatment/Interventions SLP instruction and feedback;Patient/family education;Cueing hierarchy;Environmental controls;Other (comment)    Potential to Achieve Goals Good    Potential Considerations Ability to learn/carryover information    Consulted and Agree with Plan of Care Patient             Patient will benefit from skilled therapeutic intervention in order to improve the following deficits and impairments:   Dysphonia    Problem List Patient Active Problem List   Diagnosis Date Noted   History of 2019 novel coronavirus disease (COVID-19) 09/02/2020   UTI (urinary tract infection) 04/17/2020   Diverticulitis large intestine w/o perforation or abscess w/o bleeding 04/09/2020   Pneumonia 04/09/2020   Cellulitis 03/17/2020   Breast abscess of female    Acute gastroenteritis 03/16/2020   Lump of right breast 03/16/2020   Dystrophic nail 10/26/2019   Diabetic neuropathy (HCC) 10/26/2019   Senile osteoporosis 01/26/2018   Anxiety 01/17/2018   Hyperlipidemia 01/17/2018   IBS (irritable bowel syndrome) 01/17/2018   Intra-abdominal hernia 01/17/2018   Migraine  01/17/2018   Lumbar degenerative disc disease 11/30/2017   Radicular leg pain 09/21/2017   Lymphedema 05/26/2017   Leg pain 04/26/2017   Neuropathy 04/26/2017   Chronic venous insufficiency 04/26/2017   PAD (peripheral artery disease) (HCC) 04/26/2017   Lumbar spondylosis with myelopathy 04/26/2017   COPD (chronic obstructive pulmonary disease) (HCC) 03/31/2017   GERD (gastroesophageal reflux disease) 03/31/2017   Polyneuropathy associated with underlying disease (HCC) 03/04/2017   Stage 3 chronic kidney disease 03/04/2017   Acute respiratory failure with hypoxia (HCC) 07/19/2016   Aortic atherosclerosis (HCC) 04/16/2016   Claudication (HCC) 04/15/2016   Recurrent major depressive disorder, in full remission (HCC) 10/10/2015   Essential hypertension 04/11/2015   Dysphagia 11/27/2014   Edema of both legs 12/21/2013   Dollene Primrose, MS/CCC- SLP  Leandrew Koyanagi 01/28/2021, 3:16 PM  [facility] MAIN [facility] SERVICES [address] [place], [place], [address] Phone: [phone]   Fax:  [phone]   Name: AMOR HYLE MRN: [id number] Date of Birth: June 03, 1941

## 2021-01-29 ENCOUNTER — Ambulatory Visit: Payer: Medicare Other | Admitting: Speech Pathology

## 2021-01-29 ENCOUNTER — Other Ambulatory Visit: Payer: Self-pay | Admitting: Podiatry

## 2021-01-29 ENCOUNTER — Other Ambulatory Visit: Payer: Self-pay

## 2021-01-29 ENCOUNTER — Encounter: Payer: Self-pay | Admitting: Speech Pathology

## 2021-01-29 DIAGNOSIS — R490 Dysphonia: Secondary | ICD-10-CM | POA: Diagnosis not present

## 2021-01-29 DIAGNOSIS — E1142 Type 2 diabetes mellitus with diabetic polyneuropathy: Secondary | ICD-10-CM

## 2021-01-29 DIAGNOSIS — R0989 Other specified symptoms and signs involving the circulatory and respiratory systems: Secondary | ICD-10-CM

## 2021-01-29 NOTE — Therapy (Signed)
[facility] [facility] MAIN [facility] SERVICES [address] [place], [place], [address] Phone: [phone]   Fax:  [phone]  Speech Language Pathology Treatment  Patient Details  Name: Ann Benson MRN: [id number] Date of Birth: 1940-09-10 Referring Provider (SLP): Dr. Andee Poles   Encounter Date: 01/29/2021   End of Session - 01/29/21 1311     Visit Number 9    Date for SLP Re-Evaluation 03/11/21    Authorization Type [employer] Medicare    Authorization - Visit Number 9    Progress Note Due on Visit 10    SLP Start Time 1100    SLP Stop Time  1150    SLP Time Calculation (min) 50 min    Activity Tolerance Patient tolerated treatment well             Past Medical History:  Diagnosis Date   Arthritis    Asthma    Depression    GERD (gastroesophageal reflux disease)    Headache    History of hiatal hernia    Hyperlipidemia    Hyperlipidemia    Hypertension    IBS (irritable bowel syndrome)    Neuromuscular disorder (HCC)    neuropathy    Past Surgical History:  Procedure Laterality Date   ABDOMINAL HYSTERECTOMY     APPENDECTOMY     BREAST BIOPSY Left 07/2013   neg   CHOLECYSTECTOMY     COLONOSCOPY N/A 07/08/2020   Procedure: COLONOSCOPY;  Surgeon: Toledo, Boykin Nearing, MD;  Location: [facility];  Service: Gastroenterology;  Laterality: N/A;   ESOPHAGOGASTRODUODENOSCOPY N/A 07/08/2020   Procedure: ESOPHAGOGASTRODUODENOSCOPY (EGD);  Surgeon: Toledo, Boykin Nearing, MD;  Location: [facility];  Service: Gastroenterology;  Laterality: N/A;   ESOPHAGOGASTRODUODENOSCOPY (EGD) WITH PROPOFOL N/A 03/16/2018   Procedure: ESOPHAGOGASTRODUODENOSCOPY (EGD) WITH PROPOFOL;  Surgeon: Toledo, Boykin Nearing, MD;  Location: [facility];  Service: Gastroenterology;  Laterality: N/A;   EYE SURGERY     cataracts   INCISION AND DRAINAGE ABSCESS Right 03/18/2020   Procedure: INCISION AND DRAINAGE ABSCESS-Breast;  Surgeon: Henrene Dodge, MD;  Location: [facility] OR[facility];   Service: General;  Laterality: Right;   KNEE ARTHROSCOPY     SHOULDER ARTHROSCOPY Left     There were no vitals filed for this visit.   Subjective Assessment - 01/29/21 1310     Subjective Patient attentive and cooperative with different clinicican                   ADULT SLP TREATMENT - 01/29/21 0001       General Information   Behavior/Cognition Alert;Cooperative;Pleasant mood    HPI Ann Benson is an 80 y.o. female referred by Dr. Andee Poles for dysphonia. Pt reported hoarseness, anosmia, xerostomia, and dysphagia since Covid-19 infection in January 2022. 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 Hx also noted for hiatal hernia, GERD, IBS, COPD. MBS 2 weeks ago showed mild oral dysphagia due to xerostomia, likely primary esophageal dysphagia      Treatment Provided   Treatment provided Cognitive-Linquistic      Pain Assessment   Pain Assessment No/denies pain      Cognitive-Linquistic Treatment   Treatment focused on Voice;Patient/family/caregiver education    Skilled Treatment Patient continues to report feedback from family/friends that her voice sounds better. Continues to require cues (verbal, demonstration) with aspects of HEP reviewed previously. For neck and vocal tract relaxation stretches, patient required reminder that she is to complete instructions as written on handout, not  read instructions aloud. Patient completed resonant words and personally relevant words/phrases with good vocal quality >90% accuracy. Sustained voicing (/ma/) with duration of 6.5 seconds, mod cues/imitation. Pitch glides ("ma-ma" ranging 210-359 Hz) with min cues. Attempts to elicit greater intensity resulted in strained vocal quality. Simple conversations 5-8 minutes with pt maintaining good quality voicing ~85% of the time).      Assessment / Recommendations / Plan   Plan Continue with current plan of care      Progression Toward Goals    Progression toward goals Progressing toward goals              SLP Education - 01/29/21 1310     Education Details vocal loudness via modeling    Person(s) Educated Patient    Methods Demonstration    Comprehension Returned demonstration;Need further instruction              SLP Short Term Goals - 12/11/20 1040       SLP SHORT TERM GOAL #1   Title Pt will demo HEP for breath support, MTD accurately with rare min cues.    Time 10   sessions   Status New    Target Date 01/10/21      SLP SHORT TERM GOAL #2   Title Patient will use abdominal breathing >90% accuracy in structured sentence level tasks.    Time 10    Period --   sessions   Target Date 01/10/21      SLP SHORT TERM GOAL #3   Title Patient will ID tension/strain >90% accuracy in phrase level tasks.    Time 10    Period --   sessions   Status New    Target Date 01/10/21      SLP SHORT TERM GOAL #4   Title Patient will maintain adequate vocal quality and endurance in 5 minute conversation >85% of the time using abdominal breathing and resonant voice techniques.    Time 10   sessions   Status New    Target Date 01/10/21              SLP Long Term Goals - 12/11/20 1042       SLP LONG TERM GOAL #1   Title Pt will report carryover of 4 vocal hygiene techniques between 3 consecutive sessions.    Time 12   or 17 sessions, for all LTGs   Period Weeks      SLP LONG TERM GOAL #2   Title Patient will maintain adequate vocal quality/endurance in 20 minute conversation using abdominal breathing and/or resonant voice >85% of the time.    Time 12    Period Weeks    Status New      SLP LONG TERM GOAL #3   Title Patient will report improvement in voice outcome as measured by Voice Related Quality of Life.    Baseline 12/11/20 46/50    Time 12    Period Weeks    Status New      SLP LONG TERM GOAL #4   Time --    Period --    Status --              Plan - 01/29/21 1311     Clinical  Impression Statement Patient with improved voicing today; minimal dysphonia in short conversations. Appears to have some memory difficulties which may present barrier to carryover (difficulty with immediate recall/imitation of sentences, keeping up with appointments, and recalling verbal instruction/education from previous sessions).  Pt remains responsive to resonant voice tasks; continue to focus on resonant voicing vs abdominal breathing as this has been most effective in prior sessions.  Will continue skilled ST to train pt in vocal hygiene, improve breath support for voice and reduce laryngeal tension in order to improve vocal quality, endurance, and quality of life.    Speech Therapy Frequency --   1-2 times per week   Duration --   17 sessions   Treatment/Interventions SLP instruction and feedback;Patient/family education;Cueing hierarchy;Environmental controls;Other (comment)    Potential to Achieve Goals Good    Potential Considerations Ability to learn/carryover information    Consulted and Agree with Plan of Care Patient             Patient will benefit from skilled therapeutic intervention in order to improve the following deficits and impairments:   Dysphonia    Problem List Patient Active Problem List   Diagnosis Date Noted   History of 2019 novel coronavirus disease (COVID-19) 09/02/2020   UTI (urinary tract infection) 04/17/2020   Diverticulitis large intestine w/o perforation or abscess w/o bleeding 04/09/2020   Pneumonia 04/09/2020   Cellulitis 03/17/2020   Breast abscess of female    Acute gastroenteritis 03/16/2020   Lump of right breast 03/16/2020   Dystrophic nail 10/26/2019   Diabetic neuropathy (HCC) 10/26/2019   Senile osteoporosis 01/26/2018   Anxiety 01/17/2018   Hyperlipidemia 01/17/2018   IBS (irritable bowel syndrome) 01/17/2018   Intra-abdominal hernia 01/17/2018   Migraine 01/17/2018   Lumbar degenerative disc disease 11/30/2017   Radicular leg  pain 09/21/2017   Lymphedema 05/26/2017   Leg pain 04/26/2017   Neuropathy 04/26/2017   Chronic venous insufficiency 04/26/2017   PAD (peripheral artery disease) (HCC) 04/26/2017   Lumbar spondylosis with myelopathy 04/26/2017   COPD (chronic obstructive pulmonary disease) (HCC) 03/31/2017   GERD (gastroesophageal reflux disease) 03/31/2017   Polyneuropathy associated with underlying disease (HCC) 03/04/2017   Stage 3 chronic kidney disease 03/04/2017   Acute respiratory failure with hypoxia (HCC) 07/19/2016   Aortic atherosclerosis (HCC) 04/16/2016   Claudication (HCC) 04/15/2016   Recurrent major depressive disorder, in full remission (HCC) 10/10/2015   Essential hypertension 04/11/2015   Dysphagia 11/27/2014   Edema of both legs 12/21/2013   Ann Primrose, MS/CCC- SLP  Leandrew Koyanagi 01/29/2021, 1:14 PM  [place] [facility] MAIN [facility] SERVICES [address] [place], [place], [address] Phone: [phone]   Fax:  [phone]   Name: STERLING UCCI MRN: [id number] Date of Birth: [birth year]

## 2021-02-03 ENCOUNTER — Other Ambulatory Visit: Payer: Self-pay

## 2021-02-03 ENCOUNTER — Ambulatory Visit: Payer: Medicare Other | Admitting: Speech Pathology

## 2021-02-03 DIAGNOSIS — R490 Dysphonia: Secondary | ICD-10-CM | POA: Diagnosis not present

## 2021-02-03 NOTE — Therapy (Signed)
[place] MAIN [facility] SERVICES [address] [place], [place], [address] Phone: [phone]   Fax:  [phone]  Speech Language Pathology Treatment and Progress Note  Patient Details  Name: Ann Benson MRN: [id number] Date of Birth: [birth year] Referring Provider (SLP): Dr. Pryor Ochoa   Speech Therapy Progress Note  Dates of Reporting Period: 12/11/2020 to 02/03/2021  Objective: Patient has been seen for 10 speech therapy sessions this reporting period targeting dysphonia. Patient is making progress toward LTGs and met 2/4 STGs this reporting period. See skilled intervention, clinical impressions, and goals below for details.   Encounter Date: 02/03/2021   End of Session - 02/03/21 1240     Visit Number 10    Number of Visits 17    Date for SLP Re-Evaluation 03/11/21    Authorization Type [employer] Medicare    Authorization - Visit Number 10    Progress Note Due on Visit 10    SLP Start Time 1100    SLP Stop Time  1200    SLP Time Calculation (min) 60 min    Activity Tolerance Patient tolerated treatment well             Past Medical History:  Diagnosis Date   Arthritis    Asthma    Depression    GERD (gastroesophageal reflux disease)    Headache    History of hiatal hernia    Hyperlipidemia    Hyperlipidemia    Hypertension    IBS (irritable bowel syndrome)    Neuromuscular disorder ([place])    neuropathy    Past Surgical History:  Procedure Laterality Date   ABDOMINAL HYSTERECTOMY     APPENDECTOMY     BREAST BIOPSY Left 07/2013   neg   CHOLECYSTECTOMY     COLONOSCOPY N/A 07/08/2020   Procedure: COLONOSCOPY;  Surgeon: Toledo, Benay Pike, MD;  Location: [facility];  Service: Gastroenterology;  Laterality: N/A;   ESOPHAGOGASTRODUODENOSCOPY N/A 07/08/2020   Procedure: ESOPHAGOGASTRODUODENOSCOPY (EGD);  Surgeon: Toledo, Benay Pike, MD;  Location: [facility];  Service: Gastroenterology;  Laterality: N/A;    ESOPHAGOGASTRODUODENOSCOPY (EGD) WITH PROPOFOL N/A 03/16/2018   Procedure: ESOPHAGOGASTRODUODENOSCOPY (EGD) WITH PROPOFOL;  Surgeon: Toledo, Benay Pike, MD;  Location: [facility];  Service: Gastroenterology;  Laterality: N/A;   EYE SURGERY     cataracts   INCISION AND DRAINAGE ABSCESS Right 03/18/2020   Procedure: INCISION AND DRAINAGE ABSCESS-Breast;  Surgeon: Olean Ree, MD;  Location: [facility];  Service: General;  Laterality: Right;   KNEE ARTHROSCOPY     SHOULDER ARTHROSCOPY Left     There were no vitals filed for this visit.   Subjective Assessment - 02/03/21 1233     Subjective "Last night it was like something came up and then I was hoarse."    Currently in Pain? No/denies                   ADULT SLP TREATMENT - 02/03/21 1233       General Information   Behavior/Cognition Alert;Cooperative;Pleasant mood    HPI Ann Benson is an 80 y.o. female referred by Dr. Pryor Ochoa for dysphonia. Pt reported hoarseness, anosmia, xerostomia, and dysphagia since Covid-19 infection in January 2022. 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 Hx also noted for hiatal hernia, GERD, IBS, COPD. 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 Continue to review reflux precautions and vocal hygiene. Patient continues to require cues (verbal, demonstration) with aspects of HEP reviewed previously. Patient completed resonant words and personally relevant words/phrases with good vocal quality >90% accuracy. Sustained voicing (/ma/)  with duration of 8.5 seconds, mod cues/imitation. Pitch glides ("ma-ma" ranging 224-370 Hz) with min cues. Attempts to elicit greater intensity resulted in strained vocal quality; however with model and no cues pt able to increase intensity to 79 dB without strain. Pt generated 2-3 sentences about pictures with good vocal quality 50% on first attempt, 90% with mod cues. Simple conversations 5-8 minutes with pt maintaining good quality voicing ~85% of the time.      Assessment / Recommendations / Plan   Plan Continue with current plan of care      Progression Toward Goals   Progression toward goals Progressing toward goals              SLP Education - 02/03/21 1239     Education Details breath support, loudness    Person(s) Educated Patient    Methods Demonstration    Comprehension Returned demonstration;Need further instruction              SLP Short Term Goals - 02/03/21 1243       SLP SHORT TERM GOAL #1   Title Pt will demo HEP for breath support, MTD accurately with rare min cues.    Baseline mod cues necessary    Time 10   sessions   Status Partially Met    Target Date 01/10/21      SLP SHORT TERM GOAL #2   Title Patient will use abdominal breathing >90% accuracy in structured sentence level tasks.    Time 10    Period --   sessions   Status Not Met    Target Date 01/10/21      SLP SHORT TERM GOAL #3   Title Patient will ID tension/strain >90% accuracy in phrase level tasks.    Time 10    Period --   sessions   Status Achieved    Target Date 01/10/21      SLP SHORT TERM GOAL #4   Title Patient will maintain adequate vocal quality and endurance in 5 minute conversation >85% of the time using abdominal breathing and resonant voice techniques.    Time 10   sessions   Status Achieved    Target Date 01/10/21              SLP Long Term Goals - 02/03/21 1244       SLP LONG TERM GOAL #1   Title Pt will report carryover of 4 vocal hygiene techniques  between 3 consecutive sessions.    Time 12   or 17 sessions, for all LTGs   Period Weeks    Status On-going      SLP LONG TERM GOAL #2   Title Patient will maintain adequate vocal quality/endurance in 20 minute conversation using abdominal breathing and/or resonant voice >85% of the time.    Time 12    Period Weeks    Status On-going      SLP LONG TERM GOAL #3   Title Patient will report improvement in voice outcome  as measured by Voice Related Quality of Life.    Baseline 12/11/20 46/50    Time 12    Period Weeks    Status On-going              Plan - 02/03/21 1240     Clinical Impression Statement Patient continues to improve vocal quality in short conversations, and reports positive feedback from family/friends. Appears to have some memory difficulties which may present barrier to carryover (difficulty with immediate recall/imitation of sentences, keeping up with appointments, and recalling verbal instruction/education from previous sessions). Modelling/imitation have been most effective vs verbal cues for pt. Resonant voicing tasks have been most effective technique to elicit good quality voicing. Will continue skilled ST to train pt in vocal hygiene, improve breath support for voice and reduce laryngeal tension in order to improve vocal quality, endurance, and quality of life.    Speech Therapy Frequency --   1-2 times per week   Duration --   17 sessions   Treatment/Interventions SLP instruction and feedback;Patient/family education;Cueing hierarchy;Environmental controls;Other (comment)    Potential to Achieve Goals Good    Potential Considerations Ability to learn/carryover information    Consulted and Agree with Plan of Care Patient             Patient will benefit from skilled therapeutic intervention in order to improve the following deficits and impairments:   Dysphonia    Problem List Patient Active Problem List   Diagnosis Date Noted   History of 2019 novel  coronavirus disease (COVID-19) 09/02/2020   UTI (urinary tract infection) 04/17/2020   Diverticulitis large intestine w/o perforation or abscess w/o bleeding 04/09/2020   Pneumonia 04/09/2020   Cellulitis 03/17/2020   Breast abscess of female    Acute gastroenteritis 03/16/2020   Lump of right breast 03/16/2020   Dystrophic nail 10/26/2019   Diabetic neuropathy ([place]) 10/26/2019   Senile osteoporosis 01/26/2018   Anxiety 01/17/2018   Hyperlipidemia 01/17/2018   IBS (irritable bowel syndrome) 01/17/2018   Intra-abdominal hernia 01/17/2018   Migraine 01/17/2018   Lumbar degenerative disc disease 11/30/2017   Radicular leg pain 09/21/2017   Lymphedema 05/26/2017   Leg pain 04/26/2017   Neuropathy 04/26/2017   Chronic venous insufficiency 04/26/2017   PAD (peripheral artery disease) ([place]) 04/26/2017   Lumbar spondylosis with myelopathy 04/26/2017   COPD (chronic obstructive pulmonary disease) ([place]) 03/31/2017   GERD (gastroesophageal reflux disease) 03/31/2017   Polyneuropathy associated with underlying disease ([place]) 03/04/2017   Stage 3 chronic kidney disease 03/04/2017   Acute respiratory failure with hypoxia ([place]) 07/19/2016   Aortic atherosclerosis ([place]) 04/16/2016   Claudication ([place]) 04/15/2016   Recurrent major depressive disorder, in full remission ([place]) 10/10/2015   Essential hypertension 04/11/2015   Dysphagia 11/27/2014   Edema of both legs 12/21/2013   Deneise Lever, [place], CCC-SLP Speech-Language Pathologist  Aliene Altes 02/03/2021, 12:44 PM  [place] MAIN [facility] SERVICES [address] [place], [place], [address] Phone: [phone]   Fax:  [phone]   Name: Ann Benson MRN: [id number] Date of Birth: March 08, 1941

## 2021-02-05 ENCOUNTER — Ambulatory Visit: Payer: Medicare Other | Admitting: Speech Pathology

## 2021-02-12 ENCOUNTER — Ambulatory Visit: Payer: Medicare Other | Attending: Internal Medicine | Admitting: Speech Pathology

## 2021-02-12 ENCOUNTER — Other Ambulatory Visit: Payer: Self-pay

## 2021-02-12 DIAGNOSIS — R490 Dysphonia: Secondary | ICD-10-CM | POA: Insufficient documentation

## 2021-02-12 NOTE — Therapy (Signed)
[place] MAIN [facility] SERVICES [address] [place], [place], [address] Phone: [phone]   Fax:  [phone]  Speech Language Pathology Treatment  Patient Details  Name: Ann Benson MRN: [id number] Date of Birth: [birth year] Referring Provider (SLP): Dr. Pryor Ochoa   Encounter Date: 02/12/2021   End of Session - 02/12/21 1333     Visit Number 11    Number of Visits 17    Date for SLP Re-Evaluation 03/11/21    Authorization Type [employer] Medicare    Authorization - Visit Number 1    Progress Note Due on Visit 10    SLP Start Time 1100    SLP Stop Time  1200    SLP Time Calculation (min) 60 min    Activity Tolerance Patient tolerated treatment well             Past Medical History:  Diagnosis Date   Arthritis    Asthma    Depression    GERD (gastroesophageal reflux disease)    Headache    History of hiatal hernia    Hyperlipidemia    Hyperlipidemia    Hypertension    IBS (irritable bowel syndrome)    Neuromuscular disorder ([place])    neuropathy    Past Surgical History:  Procedure Laterality Date   ABDOMINAL HYSTERECTOMY     APPENDECTOMY     BREAST BIOPSY Left 07/2013   neg   CHOLECYSTECTOMY     COLONOSCOPY N/A 07/08/2020   Procedure: COLONOSCOPY;  Surgeon: Toledo, Benay Pike, MD;  Location: [facility] END[facility];  Service: Gastroenterology;  Laterality: N/A;   ESOPHAGOGASTRODUODENOSCOPY N/A 07/08/2020   Procedure: ESOPHAGOGASTRODUODENOSCOPY (EGD);  Surgeon: Toledo, Benay Pike, MD;  Location: [facility];  Service: Gastroenterology;  Laterality: N/A;   ESOPHAGOGASTRODUODENOSCOPY (EGD) WITH PROPOFOL N/A 03/16/2018   Procedure: ESOPHAGOGASTRODUODENOSCOPY (EGD) WITH PROPOFOL;  Surgeon: Toledo, Benay Pike, MD;  Location: [facility];  Service: Gastroenterology;  Laterality: N/A;   EYE SURGERY     cataracts   INCISION AND DRAINAGE ABSCESS Right 03/18/2020   Procedure: INCISION AND DRAINAGE ABSCESS-Breast;  Surgeon: Olean Ree, MD;   Location: [facility] ORS;  Service: General;  Laterality: Right;   KNEE ARTHROSCOPY     SHOULDER ARTHROSCOPY Left     There were no vitals filed for this visit.   Subjective Assessment - 02/12/21 1329     Subjective "He said I need more air," re: ENT    Currently in Pain? No/denies                   ADULT SLP TREATMENT - 02/12/21 1329       General Information   Behavior/Cognition Alert;Cooperative    HPI Ann Benson is an 80 y.o. female referred by Dr. Pryor Ochoa for dysphonia. Pt reported hoarseness, anosmia, xerostomia, and dysphagia since Covid-19 infection in January 2022. 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 Hx also noted for hiatal hernia, GERD, IBS, COPD. MBS 2 weeks ago showed mild oral dysphagia due to xerostomia, likely primary esophageal dysphagia      Treatment Provided   Treatment provided Cognitive-Linquistic      Pain Assessment   Pain Assessment No/denies pain      Cognitive-Linquistic Treatment   Treatment focused on Voice;Patient/family/caregiver education    Skilled Treatment Reviewed ENT findings and recommendations with pt (ENT consult at [facility] on 6/29). Reviewed that current HEP addresses muscle tension as well as breath support for improving pt's vocal quality. Pt  completed neck and vocal tract relaxation with min-mod cues. Patient had questions about how to get more "air" based on conversation with ENT, and was noted to take forceful, shallow inhalation with neck tension noted. SLP re-educated on abdominal breathing, using demonstration, tactile cues, and video for teaching. Patient unable to achieve AB in upright position, but was able to do so 80% accuracy in supine position. Added airflow sounds ("Shhh") with initial mod cues fading to mod I with abdominal breath. Pt had difficulty coordinating other sounds and automatic speech (/ma/, counting, months) with breath but was able to do so with mod-max  cues, ~70% accuracy.      Assessment / Recommendations / Plan   Plan Continue with current plan of care      Progression Toward Goals   Progression toward goals Progressing toward goals                SLP Short Term Goals - 02/03/21 1243       SLP SHORT TERM GOAL #1   Title Pt will demo HEP for breath support, MTD accurately with rare min cues.    Baseline mod cues necessary    Time 10   sessions   Status Partially Met    Target Date 01/10/21      SLP SHORT TERM GOAL #2   Title Patient will use abdominal breathing >90% accuracy in structured sentence level tasks.    Time 10    Period --   sessions   Status Not Met    Target Date 01/10/21      SLP SHORT TERM GOAL #3   Title Patient will ID tension/strain >90% accuracy in phrase level tasks.    Time 10    Period --   sessions   Status Achieved    Target Date 01/10/21      SLP SHORT TERM GOAL #4   Title Patient will maintain adequate vocal quality and endurance in 5 minute conversation >85% of the time using abdominal breathing and resonant voice techniques.    Time 10   sessions   Status Achieved    Target Date 01/10/21              SLP Long Term Goals - 02/03/21 1244       SLP LONG TERM GOAL #1   Title Pt will report carryover of 4 vocal hygiene techniques between 3 consecutive sessions.    Time 12   or 17 sessions, for all LTGs   Period Weeks    Status On-going      SLP LONG TERM GOAL #2   Title Patient will maintain adequate vocal quality/endurance in 20 minute conversation using abdominal breathing and/or resonant voice >85% of the time.    Time 12    Period Weeks    Status On-going      SLP LONG TERM GOAL #3   Title Patient will report improvement in voice outcome as measured by Voice Related Quality of Life.    Baseline 12/11/20 46/50    Time 12    Period Weeks    Status On-going              Plan - 02/12/21 1334     Clinical Impression Statement Patient continues to improve vocal  quality in short conversations, and reports positive feedback from family/friends. Appears to have some memory difficulties which may present barrier to carryover (difficulty with immediate recall/imitation of sentences, keeping up with appointments, and recalling verbal instruction/education from  previous sessions). Modelling/imitation have been most effective vs verbal cues for pt. Resonant voicing tasks have been most effective technique to elicit good quality voicing; continue efforts to train abdominal breathing. 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 Will continue skilled ST to train pt in vocal hygiene, improve breath support for voice and reduce laryngeal tension in order to improve vocal quality, endurance, and quality of life.    Speech Therapy Frequency --   1-2 times per week   Duration --   17 sessions   Treatment/Interventions SLP instruction and feedback;Patient/family education;Cueing hierarchy;Environmental controls;Other (comment)    Potential to Achieve Goals Good    Potential Considerations Ability to learn/carryover information    Consulted and Agree with Plan of Care Patient             Patient will benefit from skilled therapeutic intervention in order to improve the following deficits and impairments:   Dysphonia    Problem List Patient Active Problem List   Diagnosis Date Noted   History of 2019 novel coronavirus disease (COVID-19) 09/02/2020   UTI (urinary tract infection) 04/17/2020   Diverticulitis large intestine w/o perforation or abscess w/o bleeding 04/09/2020   Pneumonia 04/09/2020   Cellulitis 03/17/2020   Breast abscess of female    Acute gastroenteritis 03/16/2020   Lump of right breast 03/16/2020   Dystrophic nail 10/26/2019   Diabetic neuropathy ([place]) 10/26/2019   Senile osteoporosis 01/26/2018   Anxiety 01/17/2018   Hyperlipidemia 01/17/2018   IBS (irritable bowel syndrome) 01/17/2018   Intra-abdominal hernia 01/17/2018    Migraine 01/17/2018   Lumbar degenerative disc disease 11/30/2017   Radicular leg pain 09/21/2017   Lymphedema 05/26/2017   Leg pain 04/26/2017   Neuropathy 04/26/2017   Chronic venous insufficiency 04/26/2017   PAD (peripheral artery disease) ([place]) 04/26/2017   Lumbar spondylosis with myelopathy 04/26/2017   COPD (chronic obstructive pulmonary disease) ([place]) 03/31/2017   GERD (gastroesophageal reflux disease) 03/31/2017   Polyneuropathy associated with underlying disease ([place]) 03/04/2017   Stage 3 chronic kidney disease 03/04/2017   Acute respiratory failure with hypoxia ([place]) 07/19/2016   Aortic atherosclerosis ([place]) 04/16/2016   Claudication ([place]) 04/15/2016   Recurrent major depressive disorder, in full remission ([place]) 10/10/2015   Essential hypertension 04/11/2015   Dysphagia 11/27/2014   Edema of both legs 12/21/2013   Deneise Lever, [place], CCC-SLP Speech-Language Pathologist   Aliene Altes 02/12/2021, 1:35 PM  [place] MAIN [facility] SERVICES [address] [place], [place], [address] Phone: [phone]   Fax:  [phone]   Name: TANIA STEINHAUSER MRN: [id number] Date of Birth: [birth year]

## 2021-02-12 NOTE — Patient Instructions (Signed)
Belly breathing  Lie down on your back. You can use pillows. Put a hand on your belly.  Breathe and relax. Feel your belly go up and down.  Your belly goes up as you breathe in. Your belly goes down as you breathe out.  After doing this for 5-10 minutes, try saying "Shhhhhhh" as you breathe out.  Feel your belly going down as you say "shhhhhhhhh"

## 2021-02-17 ENCOUNTER — Ambulatory Visit: Payer: Medicare Other | Admitting: Speech Pathology

## 2021-02-17 ENCOUNTER — Other Ambulatory Visit: Payer: Self-pay

## 2021-02-17 DIAGNOSIS — R490 Dysphonia: Secondary | ICD-10-CM

## 2021-02-17 NOTE — Therapy (Signed)
[place] MAIN [facility] SERVICES [address] [place], [place], [address] Phone: [phone]   Fax:  [phone]  Speech Language Pathology Treatment  Patient Details  Name: Ann Benson MRN: [id number] Date of Birth: 10-Mar-1941 Referring Provider (SLP): Dr. Pryor Ochoa   Encounter Date: 02/17/2021   End of Session - 02/17/21 1322     Visit Number 12    Number of Visits 17    Date for SLP Re-Evaluation 03/11/21    Authorization Type UHC Medicare    Authorization - Visit Number 2    Progress Note Due on Visit 10    SLP Start Time 1100    SLP Stop Time  1200    SLP Time Calculation (min) 60 min    Activity Tolerance Patient tolerated treatment well             Past Medical History:  Diagnosis Date   Arthritis    Asthma    Depression    GERD (gastroesophageal reflux disease)    Headache    History of hiatal hernia    Hyperlipidemia    Hyperlipidemia    Hypertension    IBS (irritable bowel syndrome)    Neuromuscular disorder ([place])    neuropathy    Past Surgical History:  Procedure Laterality Date   ABDOMINAL HYSTERECTOMY     APPENDECTOMY     BREAST BIOPSY Left 07/2013   neg   CHOLECYSTECTOMY     COLONOSCOPY N/A 07/08/2020   Procedure: COLONOSCOPY;  Surgeon: Toledo, Benay Pike, MD;  Location: [facility];  Service: Gastroenterology;  Laterality: N/A;   ESOPHAGOGASTRODUODENOSCOPY N/A 07/08/2020   Procedure: ESOPHAGOGASTRODUODENOSCOPY (EGD);  Surgeon: Toledo, Benay Pike, MD;  Location: [facility];  Service: Gastroenterology;  Laterality: N/A;   ESOPHAGOGASTRODUODENOSCOPY (EGD) WITH PROPOFOL N/A 03/16/2018   Procedure: ESOPHAGOGASTRODUODENOSCOPY (EGD) WITH PROPOFOL;  Surgeon: Toledo, Benay Pike, MD;  Location: [facility];  Service: Gastroenterology;  Laterality: N/A;   EYE SURGERY     cataracts   INCISION AND DRAINAGE ABSCESS Right 03/18/2020   Procedure: INCISION AND DRAINAGE ABSCESS-Breast;  Surgeon: Olean Ree,  MD;  Location: [facility] ORS;  Service: General;  Laterality: Right;   KNEE ARTHROSCOPY     SHOULDER ARTHROSCOPY Left     There were no vitals filed for this visit.   Subjective Assessment - 02/17/21 1225     Subjective "I practiced on Saturday."    Currently in Pain? No/denies                   ADULT SLP TREATMENT - 02/17/21 1226       General Information   Behavior/Cognition Alert;Cooperative    HPI Ann Benson is an 80 y.o. female referred by Dr. Pryor Ochoa for dysphonia. Pt reported hoarseness, anosmia, xerostomia, and dysphagia since Covid-19 infection in January 2022. 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 Hx also noted for hiatal hernia, GERD, IBS, COPD. 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 Conducted majority of session with pt in supine position on PT mat to build awareness of abdominal breathing and respiratory/phonation coordination. Abdominal  breathing in isolation >95% accurate supine. Added airflow and eventually phonation (hum) with mod-max cues (tactile assist) for onset of voicing and avoiding breath holding. Able to fade cues to occasional min A. Added automatic speech tasks (counting, ABCs) with initial max A and imitation cues required, fading to occasional mod cues, 70% accuracy in supine. Towards end of session pt reported lump in her throat; repositioned upright. Informed pt she can try semi-reclined or with multiple pillows at home to minimize potential for reflux.      Assessment / Recommendations / Plan    Plan Continue with current plan of care      Progression Toward Goals   Progression toward goals Progressing toward goals                SLP Short Term Goals - 02/03/21 1243       SLP SHORT TERM GOAL #1   Title Pt will demo HEP for breath support, MTD accurately with rare min cues.    Baseline mod cues necessary    Time 10   sessions   Status Partially Met    Target Date 01/10/21      SLP SHORT TERM GOAL #2   Title Patient will use abdominal breathing >90% accuracy in structured sentence level tasks.    Time 10    Period --   sessions   Status Not Met    Target Date 01/10/21      SLP SHORT TERM GOAL #3   Title Patient will ID tension/strain >90% accuracy in phrase level tasks.    Time 10    Period --   sessions   Status Achieved    Target Date 01/10/21      SLP SHORT TERM GOAL #4   Title Patient will maintain adequate vocal quality and endurance in 5 minute conversation >85% of the time using abdominal breathing and resonant voice techniques.    Time 10   sessions   Status Achieved    Target Date 01/10/21              SLP Long Term Goals - 02/03/21 1244       SLP LONG TERM GOAL #1   Title Pt will report carryover of 4 vocal hygiene techniques between 3 consecutive sessions.    Time 12   or 17 sessions, for all LTGs   Period Weeks    Status On-going      SLP LONG TERM GOAL #2   Title Patient will maintain adequate vocal quality/endurance in 20 minute conversation using abdominal breathing and/or resonant voice >85% of the time.    Time 12    Period Weeks    Status On-going      SLP LONG TERM GOAL #3   Title Patient will report improvement in voice outcome as measured by Voice Related Quality of Life.    Baseline 12/11/20 46/50    Time 12    Period Weeks    Status On-going              Plan - 02/17/21 1322     Clinical Impression Statement Patient continues to improve vocal quality in short conversations, and reports positive feedback from  family/friends.  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 Resonant voicing tasks have been most effective technique to elicit good quality voicing, however pt does often speak on residual capacity, with excess tension/strain. Slow and steady progress with abdominal breathing; will continue to target in supine  with hope to progress to upright in next 1-2 weeks. Rate of progress impacted by pt ability to carry over information/techniques from session to session.Will continue skilled ST to train pt in vocal hygiene, improve breath support for voice and reduce laryngeal tension in order to improve vocal quality, endurance, and quality of life.    Speech Therapy Frequency --   1-2 times per week   Duration --   17 sessions   Treatment/Interventions SLP instruction and feedback;Patient/family education;Cueing hierarchy;Environmental controls;Other (comment)    Potential to Achieve Goals Good    Potential Considerations Ability to learn/carryover information    Consulted and Agree with Plan of Care Patient             Patient will benefit from skilled therapeutic intervention in order to improve the following deficits and impairments:   Dysphonia    Problem List Patient Active Problem List   Diagnosis Date Noted   History of 2019 novel coronavirus disease (COVID-19) 09/02/2020   UTI (urinary tract infection) 04/17/2020   Diverticulitis large intestine w/o perforation or abscess w/o bleeding 04/09/2020   Pneumonia 04/09/2020   Cellulitis 03/17/2020   Breast abscess of female    Acute gastroenteritis 03/16/2020   Lump of right breast 03/16/2020   Dystrophic nail 10/26/2019   Diabetic neuropathy ([place]) 10/26/2019   Senile osteoporosis 01/26/2018   Anxiety 01/17/2018   Hyperlipidemia 01/17/2018   IBS (irritable bowel syndrome) 01/17/2018   Intra-abdominal hernia 01/17/2018   Migraine 01/17/2018   Lumbar degenerative disc disease 11/30/2017   Radicular leg pain  09/21/2017   Lymphedema 05/26/2017   Leg pain 04/26/2017   Neuropathy 04/26/2017   Chronic venous insufficiency 04/26/2017   PAD (peripheral artery disease) ([place]) 04/26/2017   Lumbar spondylosis with myelopathy 04/26/2017   COPD (chronic obstructive pulmonary disease) ([place]) 03/31/2017   GERD (gastroesophageal reflux disease) 03/31/2017   Polyneuropathy associated with underlying disease ([place]) 03/04/2017   Stage 3 chronic kidney disease 03/04/2017   Acute respiratory failure with hypoxia ([place]) 07/19/2016   Aortic atherosclerosis ([place]) 04/16/2016   Claudication ([place]) 04/15/2016   Recurrent major depressive disorder, in full remission ([place]) 10/10/2015   Essential hypertension 04/11/2015   Dysphagia 11/27/2014   Edema of both legs 12/21/2013   Deneise Lever, [place], CCC-SLP Speech-Language Pathologist  Aliene Altes 02/17/2021, 1:26 PM  [place] MAIN [facility] SERVICES [address] [place], [place], [address] Phone: [phone]   Fax:  [phone]   Name: SHEYANNE MUNLEY MRN: [id number] Date of Birth: 1941-01-20

## 2021-02-19 ENCOUNTER — Ambulatory Visit: Payer: Medicare Other | Admitting: Speech Pathology

## 2021-02-19 ENCOUNTER — Other Ambulatory Visit: Payer: Self-pay

## 2021-02-19 DIAGNOSIS — R490 Dysphonia: Secondary | ICD-10-CM

## 2021-02-19 NOTE — Therapy (Signed)
[place] MAIN [facility] SERVICES [address] [place], [place], [address] Phone: [phone]   Fax:  [phone]  Speech Language Pathology Treatment  Patient Details  Name: Ann Benson MRN: [id number] Date of Birth: September 04, 1940 Referring Provider (SLP): Dr. Pryor Ochoa   Encounter Date: 02/19/2021   End of Session - 02/19/21 1537     Visit Number 13    Number of Visits 17    Date for SLP Re-Evaluation 03/11/21    Authorization Type UHC Medicare    SLP Start Time 1100    SLP Stop Time  1200    SLP Time Calculation (min) 60 min    Activity Tolerance Patient tolerated treatment well             Past Medical History:  Diagnosis Date   Arthritis    Asthma    Depression    GERD (gastroesophageal reflux disease)    Headache    History of hiatal hernia    Hyperlipidemia    Hyperlipidemia    Hypertension    IBS (irritable bowel syndrome)    Neuromuscular disorder ([place])    neuropathy    Past Surgical History:  Procedure Laterality Date   ABDOMINAL HYSTERECTOMY     APPENDECTOMY     BREAST BIOPSY Left 07/2013   neg   CHOLECYSTECTOMY     COLONOSCOPY N/A 07/08/2020   Procedure: COLONOSCOPY;  Surgeon: Toledo, Benay Pike, MD;  Location: [facility] ENDOSCOPY;  Service: Gastroenterology;  Laterality: N/A;   ESOPHAGOGASTRODUODENOSCOPY N/A 07/08/2020   Procedure: ESOPHAGOGASTRODUODENOSCOPY (EGD);  Surgeon: Toledo, Benay Pike, MD;  Location: [facility] ENDOS[facility];  Service: Gastroenterology;  Laterality: N/A;   ESOPHAGOGASTRODUODENOSCOPY (EGD) WITH PROPOFOL N/A 03/16/2018   Procedure: ESOPHAGOGASTRODUODENOSCOPY (EGD) WITH PROPOFOL;  Surgeon: Toledo, Benay Pike, MD;  Location: [facility] ENDOSCOPY;  Service: Gastroenterology;  Laterality: N/A;   EYE SURGERY     cataracts   INCISION AND DRAINAGE ABSCESS Right 03/18/2020   Procedure: INCISION AND DRAINAGE ABSCESS-Breast;  Surgeon: Olean Ree, MD;  Location: [facility] ORS;  Service: General;  Laterality: Right;   KNEE  ARTHROSCOPY     SHOULDER ARTHROSCOPY Left     There were no vitals filed for this visit.   Subjective Assessment - 02/19/21 1532     Subjective "I practiced for an hour yesterday."    Currently in Pain? No/denies                   ADULT SLP TREATMENT - 02/19/21 0001       General Information   Behavior/Cognition Alert;Cooperative    HPI Ann Benson is an 80 y.o. female referred by Dr. Pryor Ochoa for dysphonia. Pt reported hoarseness, anosmia, xerostomia, and dysphagia since Covid-19 infection in January 2022. 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 Hx also noted for hiatal hernia, GERD, IBS, COPD. MBS 2 weeks ago showed mild oral dysphagia due to xerostomia, likely primary esophageal dysphagia      Treatment Provided   Treatment provided Cognitive-Linquistic      Pain Assessment   Pain Assessment No/denies pain      Cognitive-Linquistic Treatment   Treatment focused on Voice;Patient/family/caregiver education    Skilled Treatment SLP guided patient in completing neck and vocal tract relaxation with minimal to moderate cues. Conducted today's session in a semi-reclined position with patient sitting upright in chair with legs laying straight in chair in front of patient. SLP guided patient in completing abdminal breathing and respiratory phonation coordination. Added  airflow sounds ("shhh"), then added phonation (hum). Added speech tasks (counting, functional sentences), with moderate to maximum cues, ~70% accuracy. Patient was able to continue respiratory/phonation coordination during short conversation with SLP. SLP asked patient to rate voice on a scale of 0 to 10, with patient reporting a 5, stating that she feels her voice is halfway back to normal.      Assessment / Recommendations / Plan   Plan Continue with current plan of care      Progression Toward Goals   Progression toward goals Progressing toward goals               SLP Education - 02/19/21 1536     Education Details abdominal breathing during speech tasks    Person(s) Educated Patient    Methods Explanation;Demonstration    Comprehension Verbalized understanding;Returned demonstration              SLP Short Term Goals - 02/03/21 1243       SLP SHORT TERM GOAL #1   Title Pt will demo HEP for breath support, MTD accurately with rare min cues.    Baseline mod cues necessary    Time 10   sessions   Status Partially Met    Target Date 01/10/21      SLP SHORT TERM GOAL #2   Title Patient will use abdominal breathing >90% accuracy in structured sentence level tasks.    Time 10    Period --   sessions   Status Not Met    Target Date 01/10/21      SLP SHORT TERM GOAL #3   Title Patient will ID tension/strain >90% accuracy in phrase level tasks.    Time 10    Period --   sessions   Status Achieved    Target Date 01/10/21      SLP SHORT TERM GOAL #4   Title Patient will maintain adequate vocal quality and endurance in 5 minute conversation >85% of the time using abdominal breathing and resonant voice techniques.    Time 10   sessions   Status Achieved    Target Date 01/10/21              SLP Long Term Goals - 02/03/21 1244       SLP LONG TERM GOAL #1   Title Pt will report carryover of 4 vocal hygiene techniques between 3 consecutive sessions.    Time 12   or 17 sessions, for all LTGs   Period Weeks    Status On-going      SLP LONG TERM GOAL #2   Title Patient will maintain adequate vocal quality/endurance in 20 minute conversation using abdominal breathing and/or resonant voice >85% of the time.    Time 12    Period Weeks    Status On-going      SLP LONG TERM GOAL #3   Title Patient will report improvement in voice outcome as measured by Voice Related Quality of Life.    Baseline 12/11/20 46/50    Time 12    Period Weeks    Status On-going              Plan - 02/19/21 1538     Clinical Impression  Statement Patient continues to improve vocal quality in short conversations, and reports positive feedback from family/friends. Patient showed increased progress with abdominal breathing task in isolation today. Patient continues to require cues during respiratory/phonation coordination tasks and benefits from modeling and demonstration to [employer]  good vocal quality. Will continue to target in supine with hope to progress to upright in next 1-2 weeks. Rate of progress impacted by pt ability to carry over information/techniques from session to session.Will continue skilled ST to train pt in vocal hygiene, improve breath support for voice and reduce laryngeal tension in order to improve vocal quality, endurance, and quality of life.    Treatment/Interventions SLP instruction and feedback;Patient/family education;Cueing hierarchy;Environmental controls;Other (comment)    Potential Considerations Ability to learn/carryover information    Consulted and Agree with Plan of Care Patient             Patient will benefit from skilled therapeutic intervention in order to improve the following deficits and impairments:   Dysphonia    Problem List Patient Active Problem List   Diagnosis Date Noted   History of 2019 novel coronavirus disease (COVID-19) 09/02/2020   UTI (urinary tract infection) 04/17/2020   Diverticulitis large intestine w/o perforation or abscess w/o bleeding 04/09/2020   Pneumonia 04/09/2020   Cellulitis 03/17/2020   Breast abscess of female    Acute gastroenteritis 03/16/2020   Lump of right breast 03/16/2020   Dystrophic nail 10/26/2019   Diabetic neuropathy ([place]) 10/26/2019   Senile osteoporosis 01/26/2018   Anxiety 01/17/2018   Hyperlipidemia 01/17/2018   IBS (irritable bowel syndrome) 01/17/2018   Intra-abdominal hernia 01/17/2018   Migraine 01/17/2018   Lumbar degenerative disc disease 11/30/2017   Radicular leg pain 09/21/2017   Lymphedema 05/26/2017   Leg pain  04/26/2017   Neuropathy 04/26/2017   Chronic venous insufficiency 04/26/2017   PAD (peripheral artery disease) ([place]) 04/26/2017   Lumbar spondylosis with myelopathy 04/26/2017   COPD (chronic obstructive pulmonary disease) ([place]) 03/31/2017   GERD (gastroesophageal reflux disease) 03/31/2017   Polyneuropathy associated with underlying disease ([place]) 03/04/2017   Stage 3 chronic kidney disease 03/04/2017   Acute respiratory failure with hypoxia ([place]) 07/19/2016   Aortic atherosclerosis ([place]) 04/16/2016   Claudication ([place]) 04/15/2016   Recurrent major depressive disorder, in full remission ([place]) 10/10/2015   Essential hypertension 04/11/2015   Dysphagia 11/27/2014   Edema of both legs 12/21/2013   Ann Benson, SLP Graduate Clinician   Ann Benson 02/19/2021, 3:40 PM  [place] MAIN [facility] SERVICES [address] [place], [place], [address] Phone: [phone]   Fax:  [phone]   Name: Ann Benson MRN: [id number] Date of Birth: October 27, 1940

## 2021-02-24 ENCOUNTER — Ambulatory Visit: Payer: Medicare Other | Admitting: Speech Pathology

## 2021-02-26 ENCOUNTER — Ambulatory Visit: Payer: Medicare Other | Admitting: Speech Pathology

## 2021-03-03 ENCOUNTER — Ambulatory Visit: Payer: Medicare Other | Admitting: Speech Pathology

## 2021-03-05 ENCOUNTER — Ambulatory Visit: Payer: Medicare Other | Admitting: Speech Pathology

## 2021-03-05 ENCOUNTER — Other Ambulatory Visit: Payer: Self-pay

## 2021-03-05 DIAGNOSIS — R490 Dysphonia: Secondary | ICD-10-CM | POA: Diagnosis not present

## 2021-03-05 NOTE — Therapy (Signed)
[place] MAIN [facility] SERVICES [address] [place], [place], [address] Phone: [phone]   Fax:  [phone]  Speech Language Pathology Treatment  Patient Details  Name: Ann Benson MRN: [id number] Date of Birth: 1940-11-17 Referring Provider (SLP): Dr. Pryor Benson   Encounter Date: 03/05/2021   End of Session - 03/05/21 1350     Visit Number 14    Number of Visits 17    Date for SLP Re-Evaluation 03/11/21    Authorization Type UHC Medicare    Authorization - Visit Number 4    Progress Note Due on Visit 10    SLP Start Time 1105    SLP Stop Time  1205    SLP Time Calculation (min) 60 min    Activity Tolerance Patient tolerated treatment well             Past Medical History:  Diagnosis Date   Arthritis    Asthma    Depression    GERD (gastroesophageal reflux disease)    Headache    History of hiatal hernia    Hyperlipidemia    Hyperlipidemia    Hypertension    IBS (irritable bowel syndrome)    Neuromuscular disorder ([place])    neuropathy    Past Surgical History:  Procedure Laterality Date   ABDOMINAL HYSTERECTOMY     APPENDECTOMY     BREAST BIOPSY Left 07/2013   neg   CHOLECYSTECTOMY     COLONOSCOPY N/A 07/08/2020   Procedure: COLONOSCOPY;  Surgeon: Benson, Ann Pike, MD;  Location: [facility];  Service: Gastroenterology;  Laterality: N/A;   ESOPHAGOGASTRODUODENOSCOPY N/A 07/08/2020   Procedure: ESOPHAGOGASTRODUODENOSCOPY (EGD);  Surgeon: Benson, Ann Pike, MD;  Location: [facility];  Service: Gastroenterology;  Laterality: N/A;   ESOPHAGOGASTRODUODENOSCOPY (EGD) WITH PROPOFOL N/A 03/16/2018   Procedure: ESOPHAGOGASTRODUODENOSCOPY (EGD) WITH PROPOFOL;  Surgeon: Benson, Ann Pike, MD;  Location: [facility];  Service: Gastroenterology;  Laterality: N/A;   EYE SURGERY     cataracts   INCISION AND DRAINAGE ABSCESS Right 03/18/2020   Procedure: INCISION AND DRAINAGE ABSCESS-Breast;  Surgeon: Ann Ree,  MD;  Location: [facility] ORS;  Service: General;  Laterality: Right;   KNEE ARTHROSCOPY     SHOULDER ARTHROSCOPY Left     There were no vitals filed for this visit.   Subjective Assessment - 03/05/21 1257     Subjective "I got hoarse when I was on the phone."    Currently in Pain? No/denies                   ADULT SLP TREATMENT - 03/05/21 1258       General Information   Behavior/Cognition Alert;Cooperative    HPI Ann Benson is an 80 y.o. female referred by Dr. Pryor Benson for dysphonia. Pt reported hoarseness, anosmia, xerostomia, and dysphagia since Covid-19 infection in January 2022. 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 Hx also noted for hiatal hernia, GERD, IBS, COPD. MBS 2 weeks ago showed mild oral dysphagia due to xerostomia, likely primary esophageal dysphagia      Treatment Provided   Treatment provided Cognitive-Linquistic      Pain Assessment   Pain Assessment No/denies pain      Cognitive-Linquistic Treatment   Treatment focused on Voice;Patient/family/caregiver education    Skilled Treatment Patient missed 2 sessions last week, one due to having another appointment, and one due to therapist absence. Min-mod cues for stretches for MTD, mod-max cues initially for AB partially  reclined. When speech tasks added, pt had difficulty coordinating breathing and speech. Pt appeared frustrated and at times over-efforted when attempting speech tasks with breathing. SLP had to reduce task complexity and use modeling as primary cuing strategy. By end of session, pt imitating phrases and short responses with occasional cues, ~75% accuracy.      Assessment / Recommendations / Plan   Plan Continue with current plan of care      Progression Toward Goals   Progression toward goals Not progressing toward goals (comment)   setback after missing 1 week of appointments; anticipate progress with more regular attendance              SLP Education - 03/05/21 1350     Education Details relax vs over-efforting    Person(s) Educated Patient    Methods Explanation;Demonstration;Verbal cues    Comprehension Verbalized understanding;Verbal cues required;Need further instruction              SLP Short Term Goals - 02/03/21 1243       SLP SHORT TERM GOAL #1   Title Pt will demo HEP for breath support, MTD accurately with rare min cues.    Baseline mod cues necessary    Time 10   sessions   Status Partially Met    Target Date 01/10/21      SLP SHORT TERM GOAL #2   Title Patient will use abdominal breathing >90% accuracy in structured sentence level tasks.    Time 10    Period --   sessions   Status Not Met    Target Date 01/10/21      SLP SHORT TERM GOAL #3   Title Patient will ID tension/strain >90% accuracy in phrase level tasks.    Time 10    Period --   sessions   Status Achieved    Target Date 01/10/21      SLP SHORT TERM GOAL #4   Title Patient will maintain adequate vocal quality and endurance in 5 minute conversation >85% of the time using abdominal breathing and resonant voice techniques.    Time 10   sessions   Status Achieved    Target Date 01/10/21              SLP Long Term Goals - 02/03/21 1244       SLP LONG TERM GOAL #1   Title Pt will report carryover of 4 vocal hygiene techniques between 3 consecutive sessions.    Time 12   or 17 sessions, for all LTGs   Period Weeks    Status On-going      SLP LONG TERM GOAL #2   Title Patient will maintain adequate vocal quality/endurance in 20 minute conversation using abdominal breathing and/or resonant voice >85% of the time.    Time 12    Period Weeks    Status On-going      SLP LONG TERM GOAL #3   Title Patient will report improvement in voice outcome as measured by Voice Related Quality of Life.    Baseline 12/11/20 46/50    Time 12    Period Weeks    Status On-going              Plan - 03/05/21 1351     Clinical  Impression Statement Overall pt continues to improve vocal quality, however she had more difficulty with abdominal breathing today after missing 2 sessions last week. Patient continues to require cues during respiratory/phonation coordination tasks and benefits  from modeling and demonstration to achieve good vocal quality. Rate of progress impacted by pt ability to carry over information/techniques from session to session.Will continue skilled ST to train pt in vocal hygiene, improve breath support for voice and reduce laryngeal tension in order to improve vocal quality, endurance, and quality of life.    Treatment/Interventions SLP instruction and feedback;Patient/family education;Cueing hierarchy;Environmental controls;Other (comment)    Potential Considerations Ability to learn/carryover information    Consulted and Agree with Plan of Care Patient             Patient will benefit from skilled therapeutic intervention in order to improve the following deficits and impairments:   Dysphonia    Problem List Patient Active Problem List   Diagnosis Date Noted   History of 2019 novel coronavirus disease (COVID-19) 09/02/2020   UTI (urinary tract infection) 04/17/2020   Diverticulitis large intestine w/o perforation or abscess w/o bleeding 04/09/2020   Pneumonia 04/09/2020   Cellulitis 03/17/2020   Breast abscess of female    Acute gastroenteritis 03/16/2020   Lump of right breast 03/16/2020   Dystrophic nail 10/26/2019   Diabetic neuropathy ([place]) 10/26/2019   Senile osteoporosis 01/26/2018   Anxiety 01/17/2018   Hyperlipidemia 01/17/2018   IBS (irritable bowel syndrome) 01/17/2018   Intra-abdominal hernia 01/17/2018   Migraine 01/17/2018   Lumbar degenerative disc disease 11/30/2017   Radicular leg pain 09/21/2017   Lymphedema 05/26/2017   Leg pain 04/26/2017   Neuropathy 04/26/2017   Chronic venous insufficiency 04/26/2017   PAD (peripheral artery disease) ([place]) 04/26/2017    Lumbar spondylosis with myelopathy 04/26/2017   COPD (chronic obstructive pulmonary disease) ([place]) 03/31/2017   GERD (gastroesophageal reflux disease) 03/31/2017   Polyneuropathy associated with underlying disease ([place]) 03/04/2017   Stage 3 chronic kidney disease 03/04/2017   Acute respiratory failure with hypoxia ([place]) 07/19/2016   Aortic atherosclerosis ([place]) 04/16/2016   Claudication ([place]) 04/15/2016   Recurrent major depressive disorder, in full remission ([place]) 10/10/2015   Essential hypertension 04/11/2015   Dysphagia 11/27/2014   Edema of both legs 12/21/2013   Deneise Lever, [place], CCC-SLP Speech-Language Pathologist  Aliene Altes 03/05/2021, 1:52 PM  [place], [place], [address] Phone: [phone]   Fax:  [phone]   Name: AIRLIE BLUMENBERG MRN: [id number] Date of Birth: 1941-08-08

## 2021-03-06 ENCOUNTER — Ambulatory Visit: Payer: Medicare Other | Admitting: Podiatry

## 2021-03-10 ENCOUNTER — Institutional Professional Consult (permissible substitution): Payer: Medicare Other | Admitting: Pulmonary Disease

## 2021-03-10 ENCOUNTER — Ambulatory Visit: Payer: Medicare Other | Attending: Internal Medicine | Admitting: Speech Pathology

## 2021-03-10 ENCOUNTER — Other Ambulatory Visit: Payer: Self-pay

## 2021-03-10 DIAGNOSIS — R490 Dysphonia: Secondary | ICD-10-CM | POA: Insufficient documentation

## 2021-03-10 NOTE — Therapy (Signed)
[place] MAIN [facility] SERVICES [address] [place], [place], [address] Phone: [phone]   Fax:  [phone]  Speech Language Pathology Treatment  Patient Details  Name: Ann Benson MRN: [id number] Date of Birth: 09-15-1940 Referring Provider (SLP): Dr. Pryor Ochoa   Encounter Date: 03/10/2021   End of Session - 03/10/21 1403     Visit Number 15    Number of Visits 17    Date for SLP Re-Evaluation 03/11/21    Authorization Type [employer] Medicare    Authorization - Visit Number 5    Progress Note Due on Visit 10    SLP Start Time 1100    SLP Stop Time  1200    SLP Time Calculation (min) 60 min    Activity Tolerance Patient tolerated treatment well             Past Medical History:  Diagnosis Date   Arthritis    Asthma    Depression    GERD (gastroesophageal reflux disease)    Headache    History of hiatal hernia    Hyperlipidemia    Hyperlipidemia    Hypertension    IBS (irritable bowel syndrome)    Neuromuscular disorder ([place])    neuropathy    Past Surgical History:  Procedure Laterality Date   ABDOMINAL HYSTERECTOMY     APPENDECTOMY     BREAST BIOPSY Left 07/2013   neg   CHOLECYSTECTOMY     COLONOSCOPY N/A 07/08/2020   Procedure: COLONOSCOPY;  Surgeon: Toledo, Benay Pike, MD;  Location: [facility];  Service: Gastroenterology;  Laterality: N/A;   ESOPHAGOGASTRODUODENOSCOPY N/A 07/08/2020   Procedure: ESOPHAGOGASTRODUODENOSCOPY (EGD);  Surgeon: Toledo, Benay Pike, MD;  Location: [facility];  Service: Gastroenterology;  Laterality: N/A;   ESOPHAGOGASTRODUODENOSCOPY (EGD) WITH PROPOFOL N/A 03/16/2018   Procedure: ESOPHAGOGASTRODUODENOSCOPY (EGD) WITH PROPOFOL;  Surgeon: Toledo, Benay Pike, MD;  Location: [facility];  Service: Gastroenterology;  Laterality: N/A;   EYE SURGERY     cataracts   INCISION AND DRAINAGE ABSCESS Right 03/18/2020   Procedure: INCISION AND DRAINAGE ABSCESS-Breast;  Surgeon: Olean Ree, MD;   Location: [facility] ORS;  Service: General;  Laterality: Right;   KNEE ARTHROSCOPY     SHOULDER ARTHROSCOPY Left     There were no vitals filed for this visit.   Subjective Assessment - 03/10/21 1356     Subjective "I was so hoarse when I got up."    Currently in Pain? No/denies                   ADULT SLP TREATMENT - 03/10/21 1356       General Information   Behavior/Cognition Alert;Cooperative    HPI Ann Benson is an 80 y.o. female referred by Dr. Pryor Ochoa for dysphonia. Pt reported hoarseness, anosmia, xerostomia, and dysphagia since Covid-19 infection in January 2022. 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 Hx also noted for hiatal hernia, GERD, IBS, COPD. MBS 2 weeks ago showed mild oral dysphagia due to xerostomia, likely primary esophageal dysphagia      Treatment Provided   Treatment provided Cognitive-Linquistic      Pain Assessment   Pain Assessment No/denies pain      Cognitive-Linquistic Treatment   Treatment focused on Voice;Patient/family/caregiver education    Skilled Treatment Patient reporting hoarseness in the morning, coughing after eating and at night when lying down. Pt did not recall prior education on symptoms of reflux. Focused session on vocal hygiene today.  SLP reviewed pt's MBS images and used simple diagrams to educate pt on esophageal dysphagia and managing reflux for better vocal hygiene. Educated on modifications such as avoiding troublesome foods (fried and acidic foods, tough meat, dry bread), strategies for taking pills, alternating solids and liquids. Extended time, repetition/reinforcement necessary for teachback, however by end of session pt able to verbalize strategies she will use this week for vocal hygiene.      Assessment / Recommendations / Plan   Plan Continue with current plan of care      Progression Toward Goals   Progression toward goals Progressing toward goals               SLP Education - 03/10/21 1402     Education Details vocal hygiene, managing esophageal dysphagia    Person(s) Educated Patient    Methods Explanation;Verbal cues;Handout    Comprehension Verbalized understanding;Returned demonstration;Verbal cues required;Need further instruction              SLP Short Term Goals - 02/03/21 1243       SLP SHORT TERM GOAL #1   Title Pt will demo HEP for breath support, MTD accurately with rare min cues.    Baseline mod cues necessary    Time 10   sessions   Status Partially Met    Target Date 01/10/21      SLP SHORT TERM GOAL #2   Title Patient will use abdominal breathing >90% accuracy in structured sentence level tasks.    Time 10    Period --   sessions   Status Not Met    Target Date 01/10/21      SLP SHORT TERM GOAL #3   Title Patient will ID tension/strain >90% accuracy in phrase level tasks.    Time 10    Period --   sessions   Status Achieved    Target Date 01/10/21      SLP SHORT TERM GOAL #4   Title Patient will maintain adequate vocal quality and endurance in 5 minute conversation >85% of the time using abdominal breathing and resonant voice techniques.    Time 10   sessions   Status Achieved    Target Date 01/10/21              SLP Long Term Goals - 02/03/21 1244       SLP LONG TERM GOAL #1   Title Pt will report carryover of 4 vocal hygiene techniques between 3 consecutive sessions.    Time 12   or 17 sessions, for all LTGs   Period Weeks    Status On-going      SLP LONG TERM GOAL #2   Title Patient will maintain adequate vocal quality/endurance in 20 minute conversation using abdominal breathing and/or resonant voice >85% of the time.    Time 12    Period Weeks    Status On-going      SLP LONG TERM GOAL #3   Title Patient will report improvement in voice outcome as measured by Voice Related Quality of Life.    Baseline 12/11/20 46/50    Time 12    Period Weeks    Status On-going               Plan - 03/10/21 1403     Clinical Impression Statement Overall pt continues to improve vocal quality; she reports hoarseness often in the morning and coughing at night. Continue to educate on vocal hygiene and use demonstration, single-task/parameter focus  during sessions to improve pt understanding and carryover.  Rate of progress impacted by pt ability to carry over information/techniques from session to session, however pt is making progress toward goals. Will continue skilled ST to train pt in vocal hygiene, improve breath support for voice and reduce laryngeal tension in order to improve vocal quality, endurance, and quality of life.    Treatment/Interventions SLP instruction and feedback;Patient/family education;Cueing hierarchy;Environmental controls;Other (comment)    Potential Considerations Ability to learn/carryover information    Consulted and Agree with Plan of Care Patient             Patient will benefit from skilled therapeutic intervention in order to improve the following deficits and impairments:   Dysphonia    Problem List Patient Active Problem List   Diagnosis Date Noted   History of 2019 novel coronavirus disease (COVID-19) 09/02/2020   UTI (urinary tract infection) 04/17/2020   Diverticulitis large intestine w/o perforation or abscess w/o bleeding 04/09/2020   Pneumonia 04/09/2020   Cellulitis 03/17/2020   Breast abscess of female    Acute gastroenteritis 03/16/2020   Lump of right breast 03/16/2020   Dystrophic nail 10/26/2019   Diabetic neuropathy ([place]) 10/26/2019   Senile osteoporosis 01/26/2018   Anxiety 01/17/2018   Hyperlipidemia 01/17/2018   IBS (irritable bowel syndrome) 01/17/2018   Intra-abdominal hernia 01/17/2018   Migraine 01/17/2018   Lumbar degenerative disc disease 11/30/2017   Radicular leg pain 09/21/2017   Lymphedema 05/26/2017   Leg pain 04/26/2017   Neuropathy 04/26/2017   Chronic venous insufficiency 04/26/2017   PAD  (peripheral artery disease) ([place]) 04/26/2017   Lumbar spondylosis with myelopathy 04/26/2017   COPD (chronic obstructive pulmonary disease) ([place]) 03/31/2017   GERD (gastroesophageal reflux disease) 03/31/2017   Polyneuropathy associated with underlying disease ([place]) 03/04/2017   Stage 3 chronic kidney disease 03/04/2017   Acute respiratory failure with hypoxia ([place]) 07/19/2016   Aortic atherosclerosis ([place]) 04/16/2016   Claudication ([place]) 04/15/2016   Recurrent major depressive disorder, in full remission ([place]) 10/10/2015   Essential hypertension 04/11/2015   Dysphagia 11/27/2014   Edema of both legs 12/21/2013   Deneise Lever, [place], CCC-SLP Speech-Language Pathologist  Aliene Altes 03/10/2021, 2:05 PM  [place] [address] [place], [place], [address] Phone: [phone]   Fax:  [phone]   Name: Ann Benson MRN: [id number] Date of Birth: 1940/09/28

## 2021-03-10 NOTE — Patient Instructions (Signed)
The trouble you are having swallowing is because of your esophagus (the food tube that carries what you swallow to your stomach).  Your swallow test showed that your throat is working normally when you swallow. 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     If you feel something "sticking", it's probably not in your throat, but in your esophagus (food tube).  Here are some tips to help you avoid this.  Take small bites of food and chew well. Every time you take a bite of food and swallow, drink a sip of water afterwards. Sit up all the way when you eat and for at least 30 minutes afterwards. Try not to eat or drink anything for 2 hours before you go to bed. If you need to drink some water, just take a few small sips. Softer foods might be easier for you to eat (scrambled eggs, noodles with sauce, soft cooked vegetables).  Avoid foods that give you trouble (dry bread, dry or tough meat, tough and chewy vegetables, cucumbers, spicy foods, foods that have a lot of acid like orange juice and milk/dairy products). Sometimes tomatoes and tomato sauce can aggravate reflux. You can try taking your pills whole in a spoonful of applesauce or pudding. Then drink some water afterwards to help wash it down.

## 2021-03-12 ENCOUNTER — Other Ambulatory Visit: Payer: Self-pay

## 2021-03-12 ENCOUNTER — Ambulatory Visit: Payer: Medicare Other | Admitting: Speech Pathology

## 2021-03-12 DIAGNOSIS — R490 Dysphonia: Secondary | ICD-10-CM | POA: Diagnosis not present

## 2021-03-12 NOTE — Patient Instructions (Signed)
What makes my voice sound raspy?  There are several reasons.  If you have acid reflux, and stomach acid comes up into your throat (this can also happen at night while you're sleeping), this can irritate your throat and vocal cords. Your ENT doctor saw that you have muscle tension in your throat muscles. This means that when you talk, you are squeezing those muscles too tightly and it can make your voice sound strained or raspy. Breathing. If you don't have good breath support, it is hard to "power" your voice. This might make you strain your muscles more and lead to a raspy voice.  How do I fix it?  Manage your acid reflux. Take your pills that Dr. Graciela Husbands gave you. Follow the Swallowing Tips handout. Avoid straining your muscles: Do you neck stretches every day. Try to speak with a gentle voice. Try not to push your voice out. Give your voice time to rest. Try not to clear your throat. Stay hydrated: sip water throughout the day. Use your air to help you talk: if your voice is sounding hoarse, it means you need to take a breath.

## 2021-03-12 NOTE — Therapy (Signed)
[place] MAIN [facility] SERVICES [address] [place], [place], [address] Phone: [phone]   Fax:  [phone]  Speech Language Pathology Treatment and Recertification  Patient Details  Name: Ann Benson MRN: [id number] Date of Birth: [birth year] Referring Provider (SLP): Dr. Pryor Ochoa   Encounter Date: 03/12/2021   End of Session - 03/12/21 1827     Visit Number 16    Number of Visits 25    Date for SLP Re-Evaluation 06/10/21    Authorization Type [employer] Medicare    Authorization - Visit Number 6    Progress Note Due on Visit 10    SLP Start Time 1100    SLP Stop Time  1200    SLP Time Calculation (min) 60 min    Activity Tolerance Patient tolerated treatment well             Past Medical History:  Diagnosis Date   Arthritis    Asthma    Depression    GERD (gastroesophageal reflux disease)    Headache    History of hiatal hernia    Hyperlipidemia    Hyperlipidemia    Hypertension    IBS (irritable bowel syndrome)    Neuromuscular disorder ([place])    neuropathy    Past Surgical History:  Procedure Laterality Date   ABDOMINAL HYSTERECTOMY     APPENDECTOMY     BREAST BIOPSY Left 07/2013   neg   CHOLECYSTECTOMY     COLONOSCOPY N/A 07/08/2020   Procedure: COLONOSCOPY;  Surgeon: Toledo, Benay Pike, MD;  Location: [facility];  Service: Gastroenterology;  Laterality: N/A;   ESOPHAGOGASTRODUODENOSCOPY N/A 07/08/2020   Procedure: ESOPHAGOGASTRODUODENOSCOPY (EGD);  Surgeon: Toledo, Benay Pike, MD;  Location: [facility];  Service: Gastroenterology;  Laterality: N/A;   ESOPHAGOGASTRODUODENOSCOPY (EGD) WITH PROPOFOL N/A 03/16/2018   Procedure: ESOPHAGOGASTRODUODENOSCOPY (EGD) WITH PROPOFOL;  Surgeon: Toledo, Benay Pike, MD;  Location: [facility];  Service: Gastroenterology;  Laterality: N/A;   EYE SURGERY     cataracts   INCISION AND DRAINAGE ABSCESS Right 03/18/2020   Procedure: INCISION AND DRAINAGE ABSCESS-Breast;   Surgeon: Olean Ree, MD;  Location: [facility] ORS;  Service: General;  Laterality: Right;   KNEE ARTHROSCOPY     SHOULDER ARTHROSCOPY Left     There were no vitals filed for this visit.   Subjective Assessment - 03/12/21 1821     Subjective Pt again asks why her voice is hoarse when she gets up.    Currently in Pain? No/denies                   ADULT SLP TREATMENT - 03/12/21 1822       General Information   Behavior/Cognition Alert;Cooperative    HPI Ann Benson is an 80 y.o. female referred by Dr. Pryor Ochoa for dysphonia. Pt reported hoarseness, anosmia, xerostomia, and dysphagia since Covid-19 infection in January 2022. 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 Hx also noted for hiatal hernia, GERD, IBS, COPD. MBS 2 weeks ago showed mild oral dysphagia due to xerostomia, likely primary esophageal dysphagia      Treatment Provided   Treatment provided Cognitive-Linquistic      Pain Assessment   Pain Assessment No/denies pain      Cognitive-Linquistic Treatment   Treatment focused on Voice;Patient/family/caregiver education    Skilled Treatment SLP provided pt simple explanation/handout to answer her questions about her voice (see pt instructions today in Encounter view).  Reinforced rationale for addressing vocal  hygiene/reflux management, reducing tension/strain, and using breath support. Pt reports she did use strategy reviewed with her last session; she alternated solids and liquids when eating a pork chop and stated she felt this was helpful. Focused session on breath support using demonstration vs verbal cues, which have been difficult for pt to follow/carryover between sessions. She was able to imitate sentences with proper coordination of breathing/voicing; intermittent cues necessary for "relaxed voice" to improve vocal quality.      Assessment / Recommendations / Plan   Plan Continue with current plan of care       Progression Toward Goals   Progression toward goals Progressing toward goals              SLP Education - 03/12/21 1826     Education Details vocal hygiene, managing esophageal dysphagia, breath support    Person(s) Educated Patient    Methods Explanation    Comprehension Verbalized understanding;Verbal cues required;Need further instruction              SLP Short Term Goals - 03/12/21 1832       SLP SHORT TERM GOAL #1   Title Pt will demo HEP for breath support, MTD accurately with rare min cues.    Baseline mod cues necessary    Time 10   sessions   Status Partially Met    Target Date 01/10/21      SLP SHORT TERM GOAL #2   Title Patient will use abdominal breathing >90% accuracy in structured sentence level tasks.    Time 10    Period --   sessions   Status Not Met    Target Date 01/10/21      SLP SHORT TERM GOAL #3   Title Patient will ID tension/strain >90% accuracy in phrase level tasks.    Time 10    Period --   sessions   Status Achieved    Target Date 01/10/21      SLP SHORT TERM GOAL #4   Title Patient will maintain adequate vocal quality and endurance in 5 minute conversation >85% of the time using abdominal breathing and resonant voice techniques.    Time 10   sessions   Status Achieved    Target Date 01/10/21              SLP Long Term Goals - 03/12/21 1832       SLP LONG TERM GOAL #1   Title Pt will report carryover of 4 vocal hygiene techniques between 3 consecutive sessions.    Time 12   or 17 sessions, for all LTGs   Period Weeks    Status On-going      SLP LONG TERM GOAL #2   Title Patient will maintain adequate vocal quality/endurance in 20 minute conversation using abdominal breathing and/or resonant voice >85% of the time.    Time 12    Period Weeks    Status On-going      SLP LONG TERM GOAL #3   Title Patient will report improvement in voice outcome as measured by Voice Related Quality of Life.    Baseline 12/11/20 46/50     Time 12    Period Weeks    Status On-going              Plan - 03/12/21 1830     Clinical Impression Statement Overall pt continues to improve vocal quality; she reports hoarseness often in the morning and coughing at night. Continue to educate on  vocal hygiene and use demonstration, single-task/parameter focus during sessions to improve pt understanding and carryover.  Rate of progress impacted by pt ability to carry over information/techniques from session to session, making re-education necessary for pt to understand techniques and rationale.Hhowever, pt is making progress toward goals; recertification order completed, anticipating pt will require 6-10 more sessions to meet goals as written. Will continue skilled ST to train pt in vocal hygiene, improve breath support for voice and reduce laryngeal tension in order to improve vocal quality, endurance, and quality of life.    Treatment/Interventions SLP instruction and feedback;Patient/family education;Cueing hierarchy;Environmental controls;Other (comment)    Potential Considerations Ability to learn/carryover information    Consulted and Agree with Plan of Care Patient             Patient will benefit from skilled therapeutic intervention in order to improve the following deficits and impairments:   Dysphonia    Problem List Patient Active Problem List   Diagnosis Date Noted   History of 2019 novel coronavirus disease (COVID-19) 09/02/2020   UTI (urinary tract infection) 04/17/2020   Diverticulitis large intestine w/o perforation or abscess w/o bleeding 04/09/2020   Pneumonia 04/09/2020   Cellulitis 03/17/2020   Breast abscess of female    Acute gastroenteritis 03/16/2020   Lump of right breast 03/16/2020   Dystrophic nail 10/26/2019   Diabetic neuropathy ([place]) 10/26/2019   Senile osteoporosis 01/26/2018   Anxiety 01/17/2018   Hyperlipidemia 01/17/2018   IBS (irritable bowel syndrome) 01/17/2018   Intra-abdominal  hernia 01/17/2018   Migraine 01/17/2018   Lumbar degenerative disc disease 11/30/2017   Radicular leg pain 09/21/2017   Lymphedema 05/26/2017   Leg pain 04/26/2017   Neuropathy 04/26/2017   Chronic venous insufficiency 04/26/2017   PAD (peripheral artery disease) ([place]) 04/26/2017   Lumbar spondylosis with myelopathy 04/26/2017   COPD (chronic obstructive pulmonary disease) ([place]) 03/31/2017   GERD (gastroesophageal reflux disease) 03/31/2017   Polyneuropathy associated with underlying disease ([place]) 03/04/2017   Stage 3 chronic kidney disease 03/04/2017   Acute respiratory failure with hypoxia ([place]) 07/19/2016   Aortic atherosclerosis ([place]) 04/16/2016   Claudication ([place]) 04/15/2016   Recurrent major depressive disorder, in full remission ([place]) 10/10/2015   Essential hypertension 04/11/2015   Dysphagia 11/27/2014   Edema of both legs 12/21/2013   Deneise Lever, [place], CCC-SLP Speech-Language Pathologist  Aliene Altes 03/12/2021, 6:38 PM  [place] MAIN [facility] SERVICES [address] [place], [place], [address] Phone: [phone]   Fax:  [phone]   Name: Ann Benson MRN: [id number] Date of Birth: 1941/06/20

## 2021-03-14 ENCOUNTER — Ambulatory Visit (HOSPITAL_COMMUNITY)
Admission: RE | Admit: 2021-03-14 | Payer: Medicare Other | Source: Ambulatory Visit | Attending: Podiatry | Admitting: Podiatry

## 2021-03-17 ENCOUNTER — Other Ambulatory Visit: Payer: Self-pay

## 2021-03-17 ENCOUNTER — Ambulatory Visit: Payer: Medicare Other | Admitting: Speech Pathology

## 2021-03-17 DIAGNOSIS — R490 Dysphonia: Secondary | ICD-10-CM | POA: Diagnosis not present

## 2021-03-17 NOTE — Patient Instructions (Signed)
Straw practice  Say "Whooooooo" through a drinking straw: 10 times  Try humming "Jesus Loves Me" through the straw

## 2021-03-17 NOTE — Therapy (Signed)
[place] MAIN [facility] SERVICES [address] [place], [place], [address] Phone: [phone]   Fax:  [phone]  Speech Language Pathology Treatment  Patient Details  Name: Ann Benson MRN: [id number] Date of Birth: August 21, 1940 Referring Provider (SLP): Dr. Pryor Ochoa   Encounter Date: 03/17/2021   End of Session - 03/17/21 1638     Visit Number 17    Number of Visits 25    Date for SLP Re-Evaluation 06/10/21    Authorization Type [employer] Medicare    Authorization - Visit Number 7    Progress Note Due on Visit 10    SLP Start Time 1100    SLP Stop Time  1200    SLP Time Calculation (min) 60 min    Activity Tolerance Patient tolerated treatment well             Past Medical History:  Diagnosis Date   Arthritis    Asthma    Depression    GERD (gastroesophageal reflux disease)    Headache    History of hiatal hernia    Hyperlipidemia    Hyperlipidemia    Hypertension    IBS (irritable bowel syndrome)    Neuromuscular disorder ([place])    neuropathy    Past Surgical History:  Procedure Laterality Date   ABDOMINAL HYSTERECTOMY     APPENDECTOMY     BREAST BIOPSY Left 07/2013   neg   CHOLECYSTECTOMY     COLONOSCOPY N/A 07/08/2020   Procedure: COLONOSCOPY;  Surgeon: Toledo, Benay Pike, MD;  Location: [facility];  Service: Gastroenterology;  Laterality: N/A;   ESOPHAGOGASTRODUODENOSCOPY N/A 07/08/2020   Procedure: ESOPHAGOGASTRODUODENOSCOPY (EGD);  Surgeon: Toledo, Benay Pike, MD;  Location: [facility];  Service: Gastroenterology;  Laterality: N/A;   ESOPHAGOGASTRODUODENOSCOPY (EGD) WITH PROPOFOL N/A 03/16/2018   Procedure: ESOPHAGOGASTRODUODENOSCOPY (EGD) WITH PROPOFOL;  Surgeon: Toledo, Benay Pike, MD;  Location: [facility];  Service: Gastroenterology;  Laterality: N/A;   EYE SURGERY     cataracts   INCISION AND DRAINAGE ABSCESS Right 03/18/2020   Procedure: INCISION AND DRAINAGE ABSCESS-Breast;  Surgeon: Olean Ree, MD;   Location: [facility];  Service: General;  Laterality: Right;   KNEE ARTHROSCOPY     SHOULDER ARTHROSCOPY Left     There were no vitals filed for this visit.   Subjective Assessment - 03/17/21 1634     Subjective "My daughter says it's better." re: voice    Currently in Pain? No/denies                   ADULT SLP TREATMENT - 03/17/21 1635       General Information   Behavior/Cognition Alert;Cooperative    HPI Ann Benson is an 80 y.o. female referred by Dr. Pryor Ochoa for dysphonia. Pt reported hoarseness, anosmia, xerostomia, and dysphagia since Covid-19 infection in January 2022. 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 Hx also noted for hiatal hernia, GERD, IBS, COPD. MBS 2 weeks ago showed mild oral dysphagia due to xerostomia, likely primary esophageal dysphagia      Treatment Provided   Treatment provided Cognitive-Linquistic      Pain Assessment   Pain Assessment No/denies pain      Cognitive-Linquistic Treatment   Treatment focused on Voice;Patient/family/caregiver education    Skilled Treatment Intermittent hoarse vocal quality, pt intermittently speaking on residual capacity/ with laryngeal tension. Neck/vocal tract relaxation with min cues today. Simple phonation with oral resonance with model from clinician. Used straw phonation  for pitch glides and humming familiar tune; pt used good vocal quality and breath pacing during this task. Added to home practice with handout. When attempting to focus on abdominal breathing/breath pacing in speech tasks, pt attempt to focus/coordinate result in breath holding and confusion.      Assessment / Recommendations / Plan   Plan Continue with current plan of care      Progression Toward Goals   Progression toward goals Progressing toward goals   slow progress             SLP Education - 03/17/21 1638     Education Details straw phonation    Person(s) Educated Patient     Methods Explanation    Comprehension Verbalized understanding              SLP Short Term Goals - 03/12/21 1832       SLP SHORT TERM GOAL #1   Title Pt will demo HEP for breath support, MTD accurately with rare min cues.    Baseline mod cues necessary    Time 10   sessions   Status Partially Met    Target Date 01/10/21      SLP SHORT TERM GOAL #2   Title Patient will use abdominal breathing >90% accuracy in structured sentence level tasks.    Time 10    Period --   sessions   Status Not Met    Target Date 01/10/21      SLP SHORT TERM GOAL #3   Title Patient will ID tension/strain >90% accuracy in phrase level tasks.    Time 10    Period --   sessions   Status Achieved    Target Date 01/10/21      SLP SHORT TERM GOAL #4   Title Patient will maintain adequate vocal quality and endurance in 5 minute conversation >85% of the time using abdominal breathing and resonant voice techniques.    Time 10   sessions   Status Achieved    Target Date 01/10/21              SLP Long Term Goals - 03/12/21 1832       SLP LONG TERM GOAL #1   Title Pt will report carryover of 4 vocal hygiene techniques between 3 consecutive sessions.    Time 12   or 17 sessions, for all LTGs   Period Weeks    Status On-going      SLP LONG TERM GOAL #2   Title Patient will maintain adequate vocal quality/endurance in 20 minute conversation using abdominal breathing and/or resonant voice >85% of the time.    Time 12    Period Weeks    Status On-going      SLP LONG TERM GOAL #3   Title Patient will report improvement in voice outcome as measured by Voice Related Quality of Life.    Baseline 12/11/20 46/50    Time 12    Period Weeks    Status On-going              Plan - 03/17/21 1639     Clinical Impression Statement Patient reports general improvement in vocal quality, but continues to experience hoarseness intermittently. She has improved abilities with HEP with consistent  repetition, however coordinating abdominal breathing remains difficult for her. SLP has attempted several modalities, including verbal directions, demonstration/modeling, imitation, tactile cuing, use of videos, diagrams/visual representation of timing with speech. Though she has been able to achieve AB coordination  with speech in some sessions, she is unable to do so consistently or carryover between sessions. Will focus on other treatment modalities (vs targeting AB directly) in upcoming sessions to see if additional gains can be made. Will continue skilled ST to train pt in vocal hygiene, improve breath support for voice and reduce laryngeal tension in order to improve vocal quality, endurance, and quality of life.    Treatment/Interventions SLP instruction and feedback;Patient/family education;Cueing hierarchy;Environmental controls;Other (comment)    Potential Considerations Ability to learn/carryover information    Consulted and Agree with Plan of Care Patient             Patient will benefit from skilled therapeutic intervention in order to improve the following deficits and impairments:   Dysphonia    Problem List Patient Active Problem List   Diagnosis Date Noted   History of 2019 novel coronavirus disease (COVID-19) 09/02/2020   UTI (urinary tract infection) 04/17/2020   Diverticulitis large intestine w/o perforation or abscess w/o bleeding 04/09/2020   Pneumonia 04/09/2020   Cellulitis 03/17/2020   Breast abscess of female    Acute gastroenteritis 03/16/2020   Lump of right breast 03/16/2020   Dystrophic nail 10/26/2019   Diabetic neuropathy ([place]) 10/26/2019   Senile osteoporosis 01/26/2018   Anxiety 01/17/2018   Hyperlipidemia 01/17/2018   IBS (irritable bowel syndrome) 01/17/2018   Intra-abdominal hernia 01/17/2018   Migraine 01/17/2018   Lumbar degenerative disc disease 11/30/2017   Radicular leg pain 09/21/2017   Lymphedema 05/26/2017   Leg pain 04/26/2017    Neuropathy 04/26/2017   Chronic venous insufficiency 04/26/2017   PAD (peripheral artery disease) ([place]) 04/26/2017   Lumbar spondylosis with myelopathy 04/26/2017   COPD (chronic obstructive pulmonary disease) ([place]) 03/31/2017   GERD (gastroesophageal reflux disease) 03/31/2017   Polyneuropathy associated with underlying disease ([place]) 03/04/2017   Stage 3 chronic kidney disease 03/04/2017   Acute respiratory failure with hypoxia ([place]) 07/19/2016   Aortic atherosclerosis ([place]) 04/16/2016   Claudication ([place]) 04/15/2016   Recurrent major depressive disorder, in full remission ([place]) 10/10/2015   Essential hypertension 04/11/2015   Dysphagia 11/27/2014   Edema of both legs 12/21/2013   Deneise Lever, [place], CCC-SLP Speech-Language Pathologist  Aliene Altes 03/17/2021, 4:43 PM  [place] MAIN [facility] SERVICES [address] [place], [place], [address] Phone: [phone]   Fax:  [phone]   Name: JEANMARIE MCCOWEN MRN: [id number] Date of Birth: 12-21-1940

## 2021-03-19 ENCOUNTER — Ambulatory Visit: Payer: Medicare Other | Admitting: Speech Pathology

## 2021-03-19 ENCOUNTER — Other Ambulatory Visit: Payer: Self-pay

## 2021-03-19 DIAGNOSIS — R490 Dysphonia: Secondary | ICD-10-CM | POA: Diagnosis not present

## 2021-03-20 ENCOUNTER — Encounter: Payer: Self-pay | Admitting: Podiatry

## 2021-03-20 ENCOUNTER — Ambulatory Visit: Payer: Medicare Other | Admitting: Podiatry

## 2021-03-20 DIAGNOSIS — M79676 Pain in unspecified toe(s): Secondary | ICD-10-CM | POA: Diagnosis not present

## 2021-03-20 DIAGNOSIS — B351 Tinea unguium: Secondary | ICD-10-CM

## 2021-03-20 DIAGNOSIS — R0989 Other specified symptoms and signs involving the circulatory and respiratory systems: Secondary | ICD-10-CM

## 2021-03-20 DIAGNOSIS — E1142 Type 2 diabetes mellitus with diabetic polyneuropathy: Secondary | ICD-10-CM

## 2021-03-20 DIAGNOSIS — I739 Peripheral vascular disease, unspecified: Secondary | ICD-10-CM

## 2021-03-20 NOTE — Therapy (Signed)
[place] MAIN [facility] SERVICES [address] [place], [place], [address] Phone: [phone]   Fax:  [phone]  Speech Language Pathology Treatment  Patient Details  Name: Ann Benson MRN: [id number] Date of Birth: 07/12/1941 Referring Provider (SLP): Dr. Pryor Ochoa   Encounter Date: 03/19/2021   End of Session - 03/20/21 0909     Visit Number 18    Number of Visits 25    Date for SLP Re-Evaluation 06/10/21    Authorization Type [employer] Medicare    Authorization - Visit Number 8    Progress Note Due on Visit 10    SLP Start Time 1100    SLP Stop Time  1200    SLP Time Calculation (min) 60 min    Activity Tolerance Patient tolerated treatment well             Past Medical History:  Diagnosis Date   Arthritis    Asthma    Depression    GERD (gastroesophageal reflux disease)    Headache    History of hiatal hernia    Hyperlipidemia    Hyperlipidemia    Hypertension    IBS (irritable bowel syndrome)    Neuromuscular disorder ([place])    neuropathy    Past Surgical History:  Procedure Laterality Date   ABDOMINAL HYSTERECTOMY     APPENDECTOMY     BREAST BIOPSY Left 07/2013   neg   CHOLECYSTECTOMY     COLONOSCOPY N/A 07/08/2020   Procedure: COLONOSCOPY;  Surgeon: Toledo, Benay Pike, MD;  Location: [facility];  Service: Gastroenterology;  Laterality: N/A;   ESOPHAGOGASTRODUODENOSCOPY N/A 07/08/2020   Procedure: ESOPHAGOGASTRODUODENOSCOPY (EGD);  Surgeon: Toledo, Benay Pike, MD;  Location: [facility];  Service: Gastroenterology;  Laterality: N/A;   ESOPHAGOGASTRODUODENOSCOPY (EGD) WITH PROPOFOL N/A 03/16/2018   Procedure: ESOPHAGOGASTRODUODENOSCOPY (EGD) WITH PROPOFOL;  Surgeon: Toledo, Benay Pike, MD;  Location: [facility];  Service: Gastroenterology;  Laterality: N/A;   EYE SURGERY     cataracts   INCISION AND DRAINAGE ABSCESS Right 03/18/2020   Procedure: INCISION AND DRAINAGE ABSCESS-Breast;  Surgeon: Olean Ree,  MD;  Location: [facility];  Service: General;  Laterality: Right;   KNEE ARTHROSCOPY     SHOULDER ARTHROSCOPY Left     There were no vitals filed for this visit.   Subjective Assessment - 03/20/21 0907     Subjective Enters with hoarse/rough vocal quality    Currently in Pain? No/denies                   ADULT SLP TREATMENT - 03/20/21 0907       General Information   Behavior/Cognition Alert;Cooperative    HPI Ann Benson is an 80 y.o. female referred by Dr. Pryor Ochoa for dysphonia. Pt reported hoarseness, anosmia, xerostomia, and dysphagia since Covid-19 infection in January 2022. 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 Hx also noted for hiatal hernia, GERD, IBS, COPD. MBS 2 weeks ago showed mild oral dysphagia due to xerostomia, likely primary esophageal dysphagia      Treatment Provided   Treatment provided Cognitive-Linquistic      Pain Assessment   Pain Assessment No/denies pain      Cognitive-Linquistic Treatment   Treatment focused on Voice;Patient/family/caregiver education    Skilled Treatment Completed stretches and vocal warm-up routine. Min cues for relaxation, mod cues for straw humming. Subsequently used hum when hoarse vocal quality arose during word-level tasks. Progressed to simple conversation with return to hum  when quality deteriorated with occasional mod cues.      Assessment / Recommendations / Plan   Plan Continue with current plan of care      Progression Toward Goals   Progression toward goals Progressing toward goals              SLP Education - 03/20/21 0909     Education Details hum to clear voice    Person(s) Educated Patient    Methods Explanation    Comprehension Verbalized understanding              SLP Short Term Goals - 03/12/21 1832       SLP SHORT TERM GOAL #1   Title Pt will demo HEP for breath support, MTD accurately with rare min cues.    Baseline mod cues  necessary    Time 10   sessions   Status Partially Met    Target Date 01/10/21      SLP SHORT TERM GOAL #2   Title Patient will use abdominal breathing >90% accuracy in structured sentence level tasks.    Time 10    Period --   sessions   Status Not Met    Target Date 01/10/21      SLP SHORT TERM GOAL #3   Title Patient will ID tension/strain >90% accuracy in phrase level tasks.    Time 10    Period --   sessions   Status Achieved    Target Date 01/10/21      SLP SHORT TERM GOAL #4   Title Patient will maintain adequate vocal quality and endurance in 5 minute conversation >85% of the time using abdominal breathing and resonant voice techniques.    Time 10   sessions   Status Achieved    Target Date 01/10/21              SLP Long Term Goals - 03/12/21 1832       SLP LONG TERM GOAL #1   Title Pt will report carryover of 4 vocal hygiene techniques between 3 consecutive sessions.    Time 12   or 17 sessions, for all LTGs   Period Weeks    Status On-going      SLP LONG TERM GOAL #2   Title Patient will maintain adequate vocal quality/endurance in 20 minute conversation using abdominal breathing and/or resonant voice >85% of the time.    Time 12    Period Weeks    Status On-going      SLP LONG TERM GOAL #3   Title Patient will report improvement in voice outcome as measured by Voice Related Quality of Life.    Baseline 12/11/20 46/50    Time 12    Period Weeks    Status On-going              Plan - 03/20/21 0909     Clinical Impression Statement Patient reports general improvement in vocal quality, but continues to experience hoarseness intermittently. She has improved abilities with HEP with consistent repetition, however coordinating abdominal breathing remains difficult for her. SLP has attempted several modalities, including verbal directions, demonstration/modeling, imitation, tactile cuing, use of videos, diagrams/visual representation of timing with speech.  With focus on vocal quality and hum when noting hoarseness, pt maintained good vocal quality in simple conversation for 8 minutes. Will continue skilled ST to train pt in vocal hygiene, improve breath support for voice and reduce laryngeal tension in order to improve vocal quality, endurance, and  quality of life.    Treatment/Interventions SLP instruction and feedback;Patient/family education;Cueing hierarchy;Environmental controls;Other (comment)    Potential Considerations Ability to learn/carryover information    Consulted and Agree with Plan of Care Patient             Patient will benefit from skilled therapeutic intervention in order to improve the following deficits and impairments:   Dysphonia    Problem List Patient Active Problem List   Diagnosis Date Noted   History of 2019 novel coronavirus disease (COVID-19) 09/02/2020   UTI (urinary tract infection) 04/17/2020   Diverticulitis large intestine w/o perforation or abscess w/o bleeding 04/09/2020   Pneumonia 04/09/2020   Cellulitis 03/17/2020   Breast abscess of female    Acute gastroenteritis 03/16/2020   Lump of right breast 03/16/2020   Dystrophic nail 10/26/2019   Diabetic neuropathy ([place]) 10/26/2019   Senile osteoporosis 01/26/2018   Anxiety 01/17/2018   Hyperlipidemia 01/17/2018   IBS (irritable bowel syndrome) 01/17/2018   Intra-abdominal hernia 01/17/2018   Migraine 01/17/2018   Lumbar degenerative disc disease 11/30/2017   Radicular leg pain 09/21/2017   Lymphedema 05/26/2017   Leg pain 04/26/2017   Neuropathy 04/26/2017   Chronic venous insufficiency 04/26/2017   PAD (peripheral artery disease) ([place]) 04/26/2017   Lumbar spondylosis with myelopathy 04/26/2017   COPD (chronic obstructive pulmonary disease) ([place]) 03/31/2017   GERD (gastroesophageal reflux disease) 03/31/2017   Polyneuropathy associated with underlying disease ([place]) 03/04/2017   Stage 3 chronic kidney disease 03/04/2017   Acute  respiratory failure with hypoxia ([place]) 07/19/2016   Aortic atherosclerosis ([place]) 04/16/2016   Claudication ([place]) 04/15/2016   Recurrent major depressive disorder, in full remission ([place]) 10/10/2015   Essential hypertension 04/11/2015   Dysphagia 11/27/2014   Edema of both legs 12/21/2013   Deneise Lever, [place], CCC-SLP Speech-Language Pathologist  Aliene Altes 03/20/2021, 9:11 AM  [place], [place], [address] Phone: [phone]   Fax:  [phone]   Name: YARELIS AMBROSINO MRN: [id number] Date of Birth: 10-22-1940

## 2021-03-20 NOTE — Progress Notes (Signed)
This patient returns to my office for at risk foot care.  This patient requires this care by a professional since this patient will be at risk due to having diabetic neuropathy, PAD and lymphedema. This patient is unable to cut nails herself since the patient cannot reach her nails.These nails are painful walking and wearing shoes. She says her big toe right foot was throbbing painful yesterday. This patient presents for at risk foot care today.  General Appearance  Alert, conversant and in no acute stress.  Vascular  Dorsalis pedis and posterior tibial  pulses are weakly palpable  bilaterally.  Capillary return is within normal limits  bilaterally. Temperature is within normal limits  bilaterally.  Neurologic  Senn-Weinstein monofilament wire test within normal limits  bilaterally. Muscle power within normal limits bilaterally.  Nails Thick disfigured discolored nails with subungual debris  from hallux to fifth toes bilaterally. No evidence of bacterial infection or drainage bilaterally.  Orthopedic  No limitations of motion  feet .  No crepitus or effusions noted.  No bony pathology or digital deformities noted.  Skin  normotropic skin with no porokeratosis noted bilaterally.  No signs of infections or ulcers noted.     Onychomycosis  Pain in right toes  Pain in left toes  Consent was obtained for treatment procedures.   Mechanical debridement of nails 1-5  bilaterally performed with a nail nipper.  Filed with dremel without incident.    Return office visit    4 months                  Told patient to return for periodic foot care and evaluation due to potential at risk complications.   Helane Gunther DPM

## 2021-03-24 ENCOUNTER — Ambulatory Visit: Payer: Medicare Other | Admitting: Speech Pathology

## 2021-03-24 ENCOUNTER — Other Ambulatory Visit: Payer: Self-pay

## 2021-03-24 DIAGNOSIS — R490 Dysphonia: Secondary | ICD-10-CM

## 2021-03-24 NOTE — Therapy (Signed)
[place] MAIN [facility] SERVICES [address] [place], [place], [address] Phone: [phone]   Fax:  [phone]  Speech Language Pathology Treatment  Patient Details  Name: Ann Benson MRN: [id number] Date of Birth: May 03, 1941 Referring Provider (SLP): Dr. Pryor Ochoa   Encounter Date: 03/24/2021   End of Session - 03/24/21 1329     Visit Number 19    Number of Visits 25    Date for SLP Re-Evaluation 06/10/21    Authorization Type UHC Medicare    Authorization - Visit Number 9    Progress Note Due on Visit 10    SLP Start Time 1055    SLP Stop Time  1155    SLP Time Calculation (min) 60 min    Activity Tolerance Patient tolerated treatment well             Past Medical History:  Diagnosis Date   Arthritis    Asthma    Depression    GERD (gastroesophageal reflux disease)    Headache    History of hiatal hernia    Hyperlipidemia    Hyperlipidemia    Hypertension    IBS (irritable bowel syndrome)    Neuromuscular disorder ([place])    neuropathy    Past Surgical History:  Procedure Laterality Date   ABDOMINAL HYSTERECTOMY     APPENDECTOMY     BREAST BIOPSY Left 07/2013   neg   CHOLECYSTECTOMY     COLONOSCOPY N/A 07/08/2020   Procedure: COLONOSCOPY;  Surgeon: Toledo, Benay Pike, MD;  Location: [facility] ENDOSCOPY;  Service: Gastroenterology;  Laterality: N/A;   ESOPHAGOGASTRODUODENOSCOPY N/A 07/08/2020   Procedure: ESOPHAGOGASTRODUODENOSCOPY (EGD);  Surgeon: Toledo, Benay Pike, MD;  Location: [facility] ENDOS[facility];  Service: Gastroenterology;  Laterality: N/A;   ESOPHAGOGASTRODUODENOSCOPY (EGD) WITH PROPOFOL N/A 03/16/2018   Procedure: ESOPHAGOGASTRODUODENOSCOPY (EGD) WITH PROPOFOL;  Surgeon: Toledo, Benay Pike, MD;  Location: [facility] ENDOSCOPY;  Service: Gastroenterology;  Laterality: N/A;   EYE SURGERY     cataracts   INCISION AND DRAINAGE ABSCESS Right 03/18/2020   Procedure: INCISION AND DRAINAGE ABSCESS-Breast;  Surgeon: Olean Ree,  MD;  Location: [facility] ORS;  Service: General;  Laterality: Right;   KNEE ARTHROSCOPY     SHOULDER ARTHROSCOPY Left     There were no vitals filed for this visit.   Subjective Assessment - 03/24/21 1058     Subjective "I didn't get hoarse."    Currently in Pain? No/denies                   ADULT SLP TREATMENT - 03/24/21 1059       General Information   Behavior/Cognition Alert;Cooperative    HPI Ann Benson is an 80 y.o. female referred by Dr. Pryor Ochoa for dysphonia. Pt reported hoarseness, anosmia, xerostomia, and dysphagia since Covid-19 infection in January 2022. 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 Hx also noted for hiatal hernia, GERD, IBS, COPD. MBS 2 weeks ago showed mild oral dysphagia due to xerostomia, likely primary esophageal dysphagia      Treatment Provided   Treatment provided Cognitive-Linquistic      Pain Assessment   Pain Assessment No/denies pain      Cognitive-Linquistic Treatment   Treatment focused on Voice;Patient/family/caregiver education    Skilled Treatment Patient reports she has been practicing exercises with the straw: "I did Jesus Loves Me, and I wasn't hoarse." Daughter has noticed improvements in her voice. Pt completed neck/vocal tract relaxation with mod I. Read  personally relevant words/phrases with good vocal quality. Progressed to structured sentence responses and eventually conversation. In conversation, pt recognized hoarseness, but only after SLP pointed this out (on 4 occasions during 20 minute conversation). SLP used hum x 3 to reset voice placement, and pt was subsequently able to continue. Pt rated voice today as 9 or 10, with 10 being her "normal voice."      Assessment / Recommendations / Plan   Plan Continue with current plan of care      Progression Toward Goals   Progression toward goals Progressing toward goals              SLP Education - 03/24/21 1329      Education Details hum to reset when she notices hoarseness    Person(s) Educated Patient    Methods Explanation    Comprehension Verbalized understanding              SLP Short Term Goals - 03/12/21 1832       SLP SHORT TERM GOAL #1   Title Pt will demo HEP for breath support, MTD accurately with rare min cues.    Baseline mod cues necessary    Time 10   sessions   Status Partially Met    Target Date 01/10/21      SLP SHORT TERM GOAL #2   Title Patient will use abdominal breathing >90% accuracy in structured sentence level tasks.    Time 10    Period --   sessions   Status Not Met    Target Date 01/10/21      SLP SHORT TERM GOAL #3   Title Patient will ID tension/strain >90% accuracy in phrase level tasks.    Time 10    Period --   sessions   Status Achieved    Target Date 01/10/21      SLP SHORT TERM GOAL #4   Title Patient will maintain adequate vocal quality and endurance in 5 minute conversation >85% of the time using abdominal breathing and resonant voice techniques.    Time 10   sessions   Status Achieved    Target Date 01/10/21              SLP Long Term Goals - 03/12/21 1832       SLP LONG TERM GOAL #1   Title Pt will report carryover of 4 vocal hygiene techniques between 3 consecutive sessions.    Time 12   or 17 sessions, for all LTGs   Period Weeks    Status On-going      SLP LONG TERM GOAL #2   Title Patient will maintain adequate vocal quality/endurance in 20 minute conversation using abdominal breathing and/or resonant voice >85% of the time.    Time 12    Period Weeks    Status On-going      SLP LONG TERM GOAL #3   Title Patient will report improvement in voice outcome as measured by Voice Related Quality of Life.    Baseline 12/11/20 46/50    Time 12    Period Weeks    Status On-going              Plan - 03/24/21 1330     Clinical Impression Statement Patient continues to improve vocal quality. She has been completing  exercises at home with focus on resonant vocal quality vs abdominal breathing with good success. Using resonant voice reliably in structured tasks; requires min-mod cues to correct vocal quality  using hum in longer conversation. Will continue skilled ST for carryover of vocal hygiene, reducing laryngeal tension in order to improve vocal quality, endurance, and quality of life.    Treatment/Interventions SLP instruction and feedback;Patient/family education;Cueing hierarchy;Environmental controls;Other (comment)    Potential Considerations Ability to learn/carryover information    Consulted and Agree with Plan of Care Patient             Patient will benefit from skilled therapeutic intervention in order to improve the following deficits and impairments:   Dysphonia    Problem List Patient Active Problem List   Diagnosis Date Noted   History of 2019 novel coronavirus disease (COVID-19) 09/02/2020   UTI (urinary tract infection) 04/17/2020   Diverticulitis large intestine w/o perforation or abscess w/o bleeding 04/09/2020   Pneumonia 04/09/2020   Cellulitis 03/17/2020   Breast abscess of female    Acute gastroenteritis 03/16/2020   Lump of right breast 03/16/2020   Dystrophic nail 10/26/2019   Diabetic neuropathy ([place]) 10/26/2019   Senile osteoporosis 01/26/2018   Anxiety 01/17/2018   Hyperlipidemia 01/17/2018   IBS (irritable bowel syndrome) 01/17/2018   Intra-abdominal hernia 01/17/2018   Migraine 01/17/2018   Lumbar degenerative disc disease 11/30/2017   Radicular leg pain 09/21/2017   Lymphedema 05/26/2017   Leg pain 04/26/2017   Neuropathy 04/26/2017   Chronic venous insufficiency 04/26/2017   PAD (peripheral artery disease) ([place]) 04/26/2017   Lumbar spondylosis with myelopathy 04/26/2017   COPD (chronic obstructive pulmonary disease) ([place]) 03/31/2017   GERD (gastroesophageal reflux disease) 03/31/2017   Polyneuropathy associated with underlying disease ([place])  03/04/2017   Stage 3 chronic kidney disease 03/04/2017   Acute respiratory failure with hypoxia ([place]) 07/19/2016   Aortic atherosclerosis ([place]) 04/16/2016   Claudication ([place]) 04/15/2016   Recurrent major depressive disorder, in full remission ([place]) 10/10/2015   Essential hypertension 04/11/2015   Dysphagia 11/27/2014   Edema of both legs 12/21/2013   Deneise Lever, [place], CCC-SLP Speech-Language Pathologist   Aliene Altes 03/24/2021, 1:33 PM  [place] MAIN [facility] SERVICES [address] [place], [place], [address] Phone: [phone]   Fax:  [phone]   Name: Ann Benson MRN: [id number] Date of Birth: 12-28-1940

## 2021-03-25 ENCOUNTER — Institutional Professional Consult (permissible substitution): Payer: Medicare Other | Admitting: Internal Medicine

## 2021-03-25 NOTE — Progress Notes (Deleted)
   Ann Benson, female    DOB: [birth year], 80 y.o.   MRN: [id number]   Brief patient profile:  53 yowf ***  referred to pulmonary clinic 03/25/2021 by Dr   Marland Kitchen for eval of post covid sob     History of Present Illness  03/25/2021  Pulmonary/ 1st office eval/ [name] / [employer]  No chief complaint on file.    Dyspnea:  *** Cough: *** Sleep: *** SABA use:   Past Medical History:  Diagnosis Date   Arthritis    Asthma    Depression    GERD (gastroesophageal reflux disease)    Headache    History of hiatal hernia    Hyperlipidemia    Hyperlipidemia    Hypertension    IBS (irritable bowel syndrome)    Neuromuscular disorder ([facility])    neuropathy    Outpatient Medications Prior to Visit  Medication Sig Dispense Refill   albuterol (VENTOLIN HFA) 108 (90 Base) MCG/ACT inhaler      Calcium Carbonate-Vitamin D 600-400 MG-UNIT tablet Take 1 tablet by mouth 2 (two) times daily with a meal.      Cholecalciferol (VITAMIN D3) 2000 units capsule Take 6,000 Units by mouth daily.      COVID-19 mRNA Vac-TriS, Pfizer, SUSP injection USE AS DIRECTED .3 mL 0   Cyanocobalamin (B-12) 1000 MCG CAPS Take by mouth daily.      DULoxetine (CYMBALTA) 60 MG capsule duloxetine 60 mg capsule,delayed release     famotidine (PEPCID) 40 MG tablet Take 40 mg by mouth at bedtime.     furosemide (LASIX) 20 MG tablet      gabapentin (NEURONTIN) 300 MG capsule 300 mg 2 (two) times daily. Take 1 capsule (300 mg) in the morning and 2 capsules (600 mg) in the evening.     nystatin cream (MYCOSTATIN)      oxybutynin (DITROPAN-XL) 10 MG 24 hr tablet Take 1 tablet (10 mg total) by mouth daily. 30 tablet 11   oxyCODONE (OXY IR/ROXICODONE) 5 MG immediate release tablet Take 1 tablet (5 mg total) by mouth every 6 (six) hours as needed for moderate pain or severe pain. 20 tablet 0   pantoprazole (PROTONIX) 40 MG tablet Take 40 mg by mouth 2 (two) times daily before a meal.      prednisoLONE acetate (PRED  FORTE) 1 % ophthalmic suspension Place 1 drop into the left eye 3 (three) times daily.     rosuvastatin (CRESTOR) 5 MG tablet Take 5 mg by mouth once a week.     SENNA PLUS 8.6-50 MG tablet Take 1 tablet by mouth 2 (two) times daily.     temazepam (RESTORIL) 15 MG capsule 30 mg at bedtime as needed for sleep.      triamterene-hydrochlorothiazide (MAXZIDE-25) 37.5-25 MG tablet Take 1 tablet by mouth daily.      valACYclovir (VALTREX) 500 MG tablet Take 500 mg by mouth daily.     No facility-administered medications prior to visit.     Objective:     There were no vitals taken for this visit.         Assessment   No problem-specific Assessment & Plan notes found for this encounter.     Sandrea Hughs, MD 03/25/2021

## 2021-03-26 ENCOUNTER — Other Ambulatory Visit: Payer: Self-pay

## 2021-03-26 ENCOUNTER — Ambulatory Visit: Payer: Medicare Other | Admitting: Speech Pathology

## 2021-03-26 DIAGNOSIS — R490 Dysphonia: Secondary | ICD-10-CM | POA: Diagnosis not present

## 2021-03-26 NOTE — Therapy (Signed)
[place] MAIN [facility] SERVICES [address] [place], [place], [address] Phone: [phone]   Fax:  [phone]  Speech Language Pathology Treatment and Progress Update  Patient Details  Name: Ann Benson MRN: [id number] Date of Birth: [birth year] Referring Provider (SLP): Dr. Pryor Ochoa   Speech Therapy Progress Note  Dates of Reporting Period: 02/03/21 to 03/26/21  Objective: Patient has been seen for 10 speech therapy sessions this reporting period targeting dysphonia. Patient is making progress toward LTGs and met 1/3 LTGs this reporting period; anticipate d/c in next 1-2 sessions. See skilled intervention, clinical impressions, and goals below for details.  Encounter Date: 03/26/2021   End of Session - 03/26/21 1229     Visit Number 20    Number of Visits 25    Date for SLP Re-Evaluation 06/10/21    Authorization Type UHC Medicare    Authorization - Visit Number 10    Progress Note Due on Visit 10    SLP Start Time 1100    SLP Stop Time  1155    SLP Time Calculation (min) 55 min    Activity Tolerance Patient tolerated treatment well             Past Medical History:  Diagnosis Date   Arthritis    Asthma    Depression    GERD (gastroesophageal reflux disease)    Headache    History of hiatal hernia    Hyperlipidemia    Hyperlipidemia    Hypertension    IBS (irritable bowel syndrome)    Neuromuscular disorder ([place])    neuropathy    Past Surgical History:  Procedure Laterality Date   ABDOMINAL HYSTERECTOMY     APPENDECTOMY     BREAST BIOPSY Left 07/2013   neg   CHOLECYSTECTOMY     COLONOSCOPY N/A 07/08/2020   Procedure: COLONOSCOPY;  Surgeon: Toledo, Benay Pike, MD;  Location: [facility] ENDOSCOPY;  Service: Gastroenterology;  Laterality: N/A;   ESOPHAGOGASTRODUODENOSCOPY N/A 07/08/2020   Procedure: ESOPHAGOGASTRODUODENOSCOPY (EGD);  Surgeon: Toledo, Benay Pike, MD;  Location: [facility] END[facility];  Service: Gastroenterology;   Laterality: N/A;   ESOPHAGOGASTRODUODENOSCOPY (EGD) WITH PROPOFOL N/A 03/16/2018   Procedure: ESOPHAGOGASTRODUODENOSCOPY (EGD) WITH PROPOFOL;  Surgeon: Toledo, Benay Pike, MD;  Location: [facility] END[facility];  Service: Gastroenterology;  Laterality: N/A;   EYE SURGERY     cataracts   INCISION AND DRAINAGE ABSCESS Right 03/18/2020   Procedure: INCISION AND DRAINAGE ABSCESS-Breast;  Surgeon: Olean Ree, MD;  Location: [facility] ORS;  Service: General;  Laterality: Right;   KNEE ARTHROSCOPY     SHOULDER ARTHROSCOPY Left     There were no vitals filed for this visit.   Subjective Assessment - 03/26/21 1220     Subjective "I believe it's getting better."    Currently in Pain? No/denies                   ADULT SLP TREATMENT - 03/26/21 1221       General Information   Behavior/Cognition Alert;Cooperative    HPI Ann Benson is an 80 y.o. female referred by Dr. Pryor Ochoa for dysphonia. Pt reported hoarseness, anosmia, xerostomia, and dysphagia since Covid-19 infection in January 2022. 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 Hx also noted for hiatal hernia, GERD, IBS, COPD. MBS 2 weeks ago showed mild oral dysphagia due to xerostomia, likely primary esophageal dysphagia      Treatment Provided   Treatment provided Cognitive-Linquistic  Pain Assessment   Pain Assessment No/denies pain      Cognitive-Linquistic Treatment   Treatment focused on Voice;Patient/family/caregiver education    Skilled Treatment Provided updated handout with revised HEP program with simplified instructions, removal of abdominal breathing tasks. Pt able to follow routine with handout with mod I. ID'd hoarseness with everyday phrases and self-corrected using hum. Pt asked what she should do if she sings at church and her voice is hoarse. Encouraged her to use hum as practiced; she was able to hum Jesus Loves Me and Leone Payor with good quality. Maintained good  vocal quality >90% of the time in session today. Reports hoarseness occurs sometimes in the afternoon; she feels a lump in her throat. Reviewed education previously given on reflux management, as well as sipping water, using hum vs a throat clear. Discussed plan and will decrease frequency to 1x per week      Assessment / Recommendations / Plan   Plan Continue with current plan of care      Progression Toward Goals   Progression toward goals Progressing toward goals              SLP Education - 03/26/21 1228     Education Details simplified HEP, reflux education    Person(s) Educated Patient    Methods Explanation    Comprehension Verbalized understanding              SLP Short Term Goals - 03/26/21 1231       SLP SHORT TERM GOAL #1   Title Pt will demo HEP for breath support, MTD accurately with rare min cues.    Baseline mod cues necessary    Time 10   sessions   Status Partially Met    Target Date 01/10/21      SLP SHORT TERM GOAL #2   Title Patient will use abdominal breathing >90% accuracy in structured sentence level tasks.    Time 10    Period --   sessions   Status Not Met    Target Date 01/10/21      SLP SHORT TERM GOAL #3   Title Patient will ID tension/strain >90% accuracy in phrase level tasks.    Time 10    Period --   sessions   Status Achieved    Target Date 01/10/21      SLP SHORT TERM GOAL #4   Title Patient will maintain adequate vocal quality and endurance in 5 minute conversation >85% of the time using abdominal breathing and resonant voice techniques.    Time 10   sessions   Status Achieved    Target Date 01/10/21              SLP Long Term Goals - 03/26/21 1231       SLP LONG TERM GOAL #1   Title Pt will report carryover of 4 vocal hygiene techniques between 3 consecutive sessions.    Time 12   or 17 sessions, for all LTGs   Period Weeks    Status On-going      SLP LONG TERM GOAL #2   Title Patient will maintain adequate  vocal quality/endurance in 20 minute conversation using abdominal breathing and/or resonant voice >85% of the time.    Time 12    Period Weeks    Status Achieved      SLP LONG TERM GOAL #3   Title Patient will report improvement in voice outcome as measured by Voice Related Quality of Life.  Baseline 12/11/20 46/50    Time 12    Period Weeks    Status On-going              Plan - 03/26/21 1229     Clinical Impression Statement Patient continues to improve vocal quality. She has been completing exercises at home with focus on resonant vocal quality vs abdominal breathing with good success; simplified handout provided today. Maintaining good vocal quality >90% of session today. Progressing toward goals and pt reports she is pleased with her voice; will continue 1-2 more sessions at frequency of 1x per week to ensure carryover/maintenance. Will continue skilled ST for vocal hygiene, reducing laryngeal tension in order to improve vocal quality, endurance, and quality of life.    Speech Therapy Frequency 1x /week    Duration 2 weeks    Treatment/Interventions SLP instruction and feedback;Patient/family education;Cueing hierarchy;Environmental controls;Other (comment)    Potential Considerations Ability to learn/carryover information    Consulted and Agree with Plan of Care Patient             Patient will benefit from skilled therapeutic intervention in order to improve the following deficits and impairments:   Dysphonia    Problem List Patient Active Problem List   Diagnosis Date Noted   History of 2019 novel coronavirus disease (COVID-19) 09/02/2020   UTI (urinary tract infection) 04/17/2020   Diverticulitis large intestine w/o perforation or abscess w/o bleeding 04/09/2020   Pneumonia 04/09/2020   Cellulitis 03/17/2020   Breast abscess of female    Acute gastroenteritis 03/16/2020   Lump of right breast 03/16/2020   Dystrophic nail 10/26/2019   Diabetic neuropathy  ([place]) 10/26/2019   Senile osteoporosis 01/26/2018   Anxiety 01/17/2018   Hyperlipidemia 01/17/2018   IBS (irritable bowel syndrome) 01/17/2018   Intra-abdominal hernia 01/17/2018   Migraine 01/17/2018   Lumbar degenerative disc disease 11/30/2017   Radicular leg pain 09/21/2017   Lymphedema 05/26/2017   Leg pain 04/26/2017   Neuropathy 04/26/2017   Chronic venous insufficiency 04/26/2017   PAD (peripheral artery disease) ([place]) 04/26/2017   Lumbar spondylosis with myelopathy 04/26/2017   COPD (chronic obstructive pulmonary disease) ([place]) 03/31/2017   GERD (gastroesophageal reflux disease) 03/31/2017   Polyneuropathy associated with underlying disease ([place]) 03/04/2017   Stage 3 chronic kidney disease 03/04/2017   Acute respiratory failure with hypoxia ([place]) 07/19/2016   Aortic atherosclerosis ([place]) 04/16/2016   Claudication ([place]) 04/15/2016   Recurrent major depressive disorder, in full remission ([place]) 10/10/2015   Essential hypertension 04/11/2015   Dysphagia 11/27/2014   Edema of both legs 12/21/2013   Deneise Lever, [place], CCC-SLP Speech-Language Pathologist  Aliene Altes 03/26/2021, 12:32 PM  [place] MAIN [facility] SERVICES [address] [place], [place], [address] Phone: [phone]   Fax:  [phone]   Name: Ann Benson MRN: [id number] Date of Birth: 1941-03-05

## 2021-03-26 NOTE — Patient Instructions (Signed)
Voice Practice: Practice every day AND you can try this when your voice sounds hoarse  Neck stretches (side to side, look left and right, up and down)  2. Say "Whoooooooooo" through the straw 5 times  3. Hum Jesus Loves Me through the straw  4. Hum with NO STRAW "Hmmmmmmmmmm" 5 times  5. Read these words in a smooth, clear, easy voice  Ray    Cracker Barrel    [place] Cindy    Mayflower     Ham   Bonnie   [employer]     Chicken [place]   [employer]     Green Beans Nancy    Hardee's     Cube steak Norah   Chick-Fil-A     Pork chops Marvell   Dollar Tree     Pinto beans Carl    Belk's     Livers Carolyn   Penny's     Gravy Dog          Peanut Butter Cat          Jelly Sweet         Biscuits Hand  Jesus  REMINDERS -Don't clear your throat. Sip water instead. -Don't force your voice. Be gentle. -If your voice sounds hoarse or scratchy, Hum to try to make it clear.

## 2021-03-31 ENCOUNTER — Ambulatory Visit: Payer: Medicare Other | Admitting: Speech Pathology

## 2021-04-02 ENCOUNTER — Other Ambulatory Visit: Payer: Self-pay

## 2021-04-02 ENCOUNTER — Ambulatory Visit: Payer: Medicare Other | Admitting: Speech Pathology

## 2021-04-02 DIAGNOSIS — R490 Dysphonia: Secondary | ICD-10-CM

## 2021-04-02 NOTE — Therapy (Signed)
[place] MAIN [facility] SERVICES [address] [place], [place], [address] Phone: [phone]   Fax:  [phone]  Speech Language Pathology Treatment and Discharge Summary  Patient Details  Name: Ann Benson MRN: [id number] Date of Birth: 1941-01-30 Referring Provider (SLP): Dr. Pryor Ochoa   Encounter Date: 04/02/2021   End of Session - 04/02/21 1304     Visit Number 21    Number of Visits 25    Date for SLP Re-Evaluation 06/10/21    Authorization Type [employer]    Authorization - Visit Number 1    Progress Note Due on Visit 10    SLP Start Time 1100    SLP Stop Time  1155    SLP Time Calculation (min) 55 min    Activity Tolerance Patient tolerated treatment well             Past Medical History:  Diagnosis Date   Arthritis    Asthma    Depression    GERD (gastroesophageal reflux disease)    Headache    History of hiatal hernia    Hyperlipidemia    Hyperlipidemia    Hypertension    IBS (irritable bowel syndrome)    Neuromuscular disorder ([place])    neuropathy    Past Surgical History:  Procedure Laterality Date   ABDOMINAL HYSTERECTOMY     APPENDECTOMY     BREAST BIOPSY Left 07/2013   neg   CHOLECYSTECTOMY     COLONOSCOPY N/A 07/08/2020   Procedure: COLONOSCOPY;  Surgeon: Toledo, Benay Pike, MD;  Location: [facility] ENDOSCOPY;  Service: Gastroenterology;  Laterality: N/A;   ESOPHAGOGASTRODUODENOSCOPY N/A 07/08/2020   Procedure: ESOPHAGOGASTRODUODENOSCOPY (EGD);  Surgeon: Toledo, Benay Pike, MD;  Location: [facility] ENDOSCOPY;  Service: Gastroenterology;  Laterality: N/A;   ESOPHAGOGASTRODUODENOSCOPY (EGD) WITH PROPOFOL N/A 03/16/2018   Procedure: ESOPHAGOGASTRODUODENOSCOPY (EGD) WITH PROPOFOL;  Surgeon: Toledo, Benay Pike, MD;  Location: [facility] ENDOSCOPY;  Service: Gastroenterology;  Laterality: N/A;   EYE SURGERY     cataracts   INCISION AND DRAINAGE ABSCESS Right 03/18/2020   Procedure: INCISION AND DRAINAGE ABSCESS-Breast;   Surgeon: Olean Ree, MD;  Location: [facility] ORS;  Service: General;  Laterality: Right;   KNEE ARTHROSCOPY     SHOULDER ARTHROSCOPY Left     There were no vitals filed for this visit.   Subjective Assessment - 04/02/21 1257     Subjective Rushed in from parking lot, short of breath. Voice hoarse.    Currently in Pain? No/denies                   ADULT SLP TREATMENT - 04/02/21 1257       General Information   Behavior/Cognition Alert;Cooperative    HPI Ann Benson is an 80 y.o. female referred by Dr. Pryor Ochoa for dysphonia. Pt reported hoarseness, anosmia, xerostomia, and dysphagia since Covid-19 infection in January 2022. 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 Hx also noted for hiatal hernia, GERD, IBS, COPD. 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 Several throat clears today and required cues  to sip water instead of clearing throat. Pt aware of hoarseness entering ST room but despite repeated education/instruction requires cues on how to "reset." SLP explained that exercises won't "cure" her voice; they are a reminder to her that she needs to adjust/change the way she is using her voice to avoid straining. Education also provided that her breathing function is also tied to voice; if she is short of breath, she won't be able to maintain a good quality voice. Pt was able to achieve good vocal quality in conversation after completing her "reset" routine (stretches, resonant hum/straw phonation). At this  time pt expresses desire to continue with her practice at home; she is discharged today at her request.      Assessment / Recommendations / Plan   Plan Discharge SLP treatment due to (comment)   pt request     Progression Toward Goals   Progression toward goals --   Goals partially met, pt d/c at her request             SLP Education - 04/02/21 1303     Education Details Exercises are to help "reset" how she is using her voice, vocal hygiene (throat clearing)    Person(s) Educated Patient    Methods Explanation    Comprehension Verbalized understanding              SLP Short Term Goals - 04/02/21 1720       SLP SHORT TERM GOAL #1   Title Pt will demo HEP for breath support, MTD accurately with rare min cues.    Baseline mod cues necessary    Time 10   sessions   Status Partially Met    Target Date 01/10/21      SLP SHORT TERM GOAL #2   Title Patient will use abdominal breathing >90% accuracy in structured sentence level tasks.    Time 10    Period --   sessions   Status Not Met    Target Date 01/10/21      SLP SHORT TERM GOAL #3   Title Patient will ID tension/strain >90% accuracy in phrase level tasks.    Time 10    Period --   sessions   Status Achieved    Target Date 01/10/21      SLP SHORT TERM GOAL #4   Title Patient will maintain adequate vocal quality and endurance in 5 minute conversation >85% of the time using abdominal breathing and resonant voice techniques.    Time 10   sessions   Status Achieved    Target Date 01/10/21              SLP Long Term Goals - 04/02/21 1720       SLP LONG TERM GOAL #1   Title Pt will report carryover of 4 vocal hygiene techniques between 3 consecutive sessions.    Time 12   or 17 sessions, for all LTGs   Period Weeks    Status Not Met      SLP LONG TERM GOAL #2   Title Patient will maintain adequate vocal quality/endurance in 20 minute conversation using abdominal breathing and/or resonant voice >85% of  the time.    Time 12    Period Weeks    Status Achieved      SLP LONG TERM GOAL #3   Title Patient will report improvement in voice outcome as measured by Voice Related Quality of Life.    Baseline 12/11/20 46/50  Time 12    Period Weeks    Status Not Met              Plan - 04/02/21 1559     Clinical Impression Statement After several sessions of improving vocal quality, pt entered tx room with hoarseness today. She is able to "reset" with relaxation, and resonant voice techniques, as well as when given time to catch her breath, but requires cues to do so. Pt stated today that she would like for this to be her final session and she will continue her exercises at home. She reports she gets short of breath more easily since having Covid-19; continue to educate on how this can impact her voice. Despite varied cuing strategies to train abdominal breathing, patient was unable to perform this skill consistently or demonstrate carryover session-to-session. Despite pt's motivation for progress and practice at home, she did not meet all goals; throughout treatment pt exhibited difficulty recalling information and how to use trained vocal techniques to achieve better vocal quality. She is discharged today at her request.    Speech Therapy Frequency --   d/c   Duration --   d/c   Treatment/Interventions SLP instruction and feedback;Patient/family education;Cueing hierarchy;Environmental controls;Other (comment)    Potential Considerations Ability to learn/carryover information    Consulted and Agree with Plan of Care Patient             Patient will benefit from skilled therapeutic intervention in order to improve the following deficits and impairments:   Dysphonia    Problem List Patient Active Problem List   Diagnosis Date Noted   History of 2019 novel coronavirus disease (COVID-19) 09/02/2020   UTI (urinary tract infection) 04/17/2020   Diverticulitis large intestine w/o  perforation or abscess w/o bleeding 04/09/2020   Pneumonia 04/09/2020   Cellulitis 03/17/2020   Breast abscess of female    Acute gastroenteritis 03/16/2020   Lump of right breast 03/16/2020   Dystrophic nail 10/26/2019   Diabetic neuropathy ([place]) 10/26/2019   Senile osteoporosis 01/26/2018   Anxiety 01/17/2018   Hyperlipidemia 01/17/2018   IBS (irritable bowel syndrome) 01/17/2018   Intra-abdominal hernia 01/17/2018   Migraine 01/17/2018   Lumbar degenerative disc disease 11/30/2017   Radicular leg pain 09/21/2017   Lymphedema 05/26/2017   Leg pain 04/26/2017   Neuropathy 04/26/2017   Chronic venous insufficiency 04/26/2017   PAD (peripheral artery disease) ([place]) 04/26/2017   Lumbar spondylosis with myelopathy 04/26/2017   COPD (chronic obstructive pulmonary disease) ([place]) 03/31/2017   GERD (gastroesophageal reflux disease) 03/31/2017   Polyneuropathy associated with underlying disease ([place]) 03/04/2017   Stage 3 chronic kidney disease 03/04/2017   Acute respiratory failure with hypoxia ([place]) 07/19/2016   Aortic atherosclerosis ([place]) 04/16/2016   Claudication ([place]) 04/15/2016   Recurrent major depressive disorder, in full remission ([place]) 10/10/2015   Essential hypertension 04/11/2015   Dysphagia 11/27/2014   Edema of both legs 12/21/2013   SPEECH THERAPY DISCHARGE SUMMARY  Visits from Start of Care: 21  Current functional level related to goals / functional outcomes: Patient is able to maintain adequate vocal quality in conversation, with verbal cues to use strategies to reset strained/hoarse vocal quality.  Remaining deficits: Pt continues with intermittent hoarseness due to difficulties recalling/carrying over vocal techniques to reduce strain, and difficulty learning new information.   Education / Equipment: Education provided regarding Insurance underwriter, resonant voicing, simplified vocal exercise routine  Patient agrees to discharge. Patient goals were partially met.  Patient is being discharged due to  the patient's request..   Note: Portions of this document were prepared using Dragon voice recognition software and although reviewed may contain unintentional dictation errors in syntax, grammar, or spelling.  Deneise Lever, [place], CCC-SLP Speech-Language Pathologist   Aliene Altes 04/02/2021, 5:20 PM  [place] MAIN [facility] SERVICES [address] [place], [place], [address] Phone: [phone]   Fax:  [phone]   Name: Ann Benson MRN: [id number] Date of Birth: 1940/09/11

## 2021-04-07 ENCOUNTER — Ambulatory Visit: Payer: Medicare Other | Admitting: Speech Pathology

## 2021-04-09 ENCOUNTER — Encounter: Payer: Medicare Other | Admitting: Speech Pathology

## 2021-04-09 ENCOUNTER — Ambulatory Visit: Payer: Medicare Other | Admitting: Speech Pathology

## 2021-04-16 ENCOUNTER — Ambulatory Visit: Payer: Medicare Other | Admitting: Speech Pathology

## 2021-04-21 ENCOUNTER — Ambulatory Visit: Payer: Medicare Other | Admitting: Speech Pathology

## 2021-04-23 ENCOUNTER — Ambulatory Visit: Payer: Medicare Other | Admitting: Speech Pathology

## 2021-04-24 ENCOUNTER — Other Ambulatory Visit: Payer: Self-pay

## 2021-04-24 ENCOUNTER — Ambulatory Visit (INDEPENDENT_AMBULATORY_CARE_PROVIDER_SITE_OTHER): Payer: Medicare Other

## 2021-04-24 DIAGNOSIS — E1142 Type 2 diabetes mellitus with diabetic polyneuropathy: Secondary | ICD-10-CM

## 2021-04-24 DIAGNOSIS — R0989 Other specified symptoms and signs involving the circulatory and respiratory systems: Secondary | ICD-10-CM

## 2021-04-28 ENCOUNTER — Ambulatory Visit: Payer: Medicare Other | Admitting: Speech Pathology

## 2021-04-30 ENCOUNTER — Ambulatory Visit: Payer: Medicare Other | Admitting: Speech Pathology

## 2021-05-05 ENCOUNTER — Ambulatory Visit: Payer: Medicare Other | Admitting: Speech Pathology

## 2021-05-07 ENCOUNTER — Ambulatory Visit: Payer: Medicare Other | Admitting: Speech Pathology

## 2021-05-12 ENCOUNTER — Ambulatory Visit: Payer: Medicare Other | Admitting: Speech Pathology

## 2021-05-14 ENCOUNTER — Ambulatory Visit: Payer: Medicare Other | Admitting: Speech Pathology

## 2021-05-19 ENCOUNTER — Ambulatory Visit: Payer: Medicare Other | Admitting: Speech Pathology

## 2021-05-21 ENCOUNTER — Ambulatory Visit: Payer: Medicare Other | Admitting: Speech Pathology

## 2021-05-26 ENCOUNTER — Ambulatory Visit: Payer: Medicare Other | Admitting: Speech Pathology

## 2021-05-28 ENCOUNTER — Ambulatory Visit: Payer: Medicare Other | Admitting: Speech Pathology

## 2021-06-02 ENCOUNTER — Ambulatory Visit: Payer: Medicare Other | Admitting: Speech Pathology

## 2021-06-04 ENCOUNTER — Ambulatory Visit: Payer: Medicare Other | Admitting: Speech Pathology

## 2021-06-09 ENCOUNTER — Ambulatory Visit: Payer: Medicare Other | Admitting: Speech Pathology

## 2021-06-11 ENCOUNTER — Ambulatory Visit: Payer: Medicare Other | Admitting: Speech Pathology

## 2021-06-16 ENCOUNTER — Ambulatory Visit: Payer: Medicare Other | Admitting: Speech Pathology

## 2021-06-18 ENCOUNTER — Ambulatory Visit: Payer: Medicare Other | Admitting: Speech Pathology

## 2021-06-23 ENCOUNTER — Ambulatory Visit: Payer: Medicare Other | Admitting: Speech Pathology

## 2021-06-25 ENCOUNTER — Ambulatory Visit: Payer: Medicare Other | Admitting: Speech Pathology

## 2021-06-26 ENCOUNTER — Other Ambulatory Visit: Payer: Self-pay

## 2021-06-26 ENCOUNTER — Ambulatory Visit: Payer: Medicare Other | Admitting: Podiatry

## 2021-06-26 ENCOUNTER — Encounter: Payer: Self-pay | Admitting: Podiatry

## 2021-06-26 DIAGNOSIS — B351 Tinea unguium: Secondary | ICD-10-CM

## 2021-06-26 DIAGNOSIS — E1142 Type 2 diabetes mellitus with diabetic polyneuropathy: Secondary | ICD-10-CM

## 2021-06-26 DIAGNOSIS — I739 Peripheral vascular disease, unspecified: Secondary | ICD-10-CM | POA: Diagnosis not present

## 2021-06-26 DIAGNOSIS — M79676 Pain in unspecified toe(s): Secondary | ICD-10-CM

## 2021-06-26 DIAGNOSIS — R0989 Other specified symptoms and signs involving the circulatory and respiratory systems: Secondary | ICD-10-CM

## 2021-06-26 NOTE — Progress Notes (Signed)
This patient returns to my office for at risk foot care.  This patient requires this care by a professional since this patient will be at risk due to having diabetic neuropathy, PAD and lymphedema.  This patient is unable to cut nails herself since the patient cannot reach her nails.These nails are painful walking and wearing shoes.  This patient presents for at risk foot care today.  General Appearance  Alert, conversant and in no acute stress.  Vascular  Dorsalis pedis and posterior tibial  pulses are weakly palpable  bilaterally.  Capillary return is within normal limits  bilaterally. Temperature is within normal limits  bilaterally.  Neurologic  Senn-Weinstein monofilament wire test within normal limits  bilaterally. Muscle power within normal limits bilaterally.  Nails Thick disfigured discolored nails with subungual debris  from hallux to fifth toes bilaterally. No evidence of bacterial infection or drainage bilaterally.  Orthopedic  No limitations of motion  feet .  No crepitus or effusions noted.  No bony pathology or digital deformities noted.  Skin  normotropic skin with no porokeratosis noted bilaterally.  No signs of infections or ulcers noted.     Onychomycosis  Pain in right toes  Pain in left toes  Consent was obtained for treatment procedures.   Mechanical debridement of nails 1-5  bilaterally performed with a nail nipper.  Filed with dremel without incident.    Return office visit    3  months                  Told patient to return for periodic foot care and evaluation due to potential at risk complications.   Helane Gunther DPM

## 2021-06-30 ENCOUNTER — Ambulatory Visit: Payer: Medicare Other | Admitting: Speech Pathology

## 2021-07-02 ENCOUNTER — Ambulatory Visit: Payer: Medicare Other | Admitting: Speech Pathology

## 2021-07-07 ENCOUNTER — Ambulatory Visit: Payer: Medicare Other | Admitting: Speech Pathology

## 2021-07-09 ENCOUNTER — Ambulatory Visit: Payer: Medicare Other | Admitting: Speech Pathology

## 2021-07-14 ENCOUNTER — Ambulatory Visit: Payer: Medicare Other | Admitting: Speech Pathology

## 2021-07-16 ENCOUNTER — Ambulatory Visit: Payer: Medicare Other | Admitting: Speech Pathology

## 2021-07-21 ENCOUNTER — Ambulatory Visit: Payer: Medicare Other | Admitting: Speech Pathology

## 2021-07-23 ENCOUNTER — Ambulatory Visit: Payer: Medicare Other | Admitting: Speech Pathology

## 2021-07-28 ENCOUNTER — Ambulatory Visit: Payer: Medicare Other | Admitting: Speech Pathology

## 2021-07-30 ENCOUNTER — Ambulatory Visit: Payer: Medicare Other | Admitting: Speech Pathology

## 2021-08-06 ENCOUNTER — Ambulatory Visit: Payer: Medicare Other | Admitting: Speech Pathology

## 2024-02-28 ENCOUNTER — Other Ambulatory Visit: Payer: Self-pay | Admitting: Family Medicine

## 2024-02-28 DIAGNOSIS — M25552 Pain in left hip: Secondary | ICD-10-CM

## 2024-02-28 DIAGNOSIS — M5416 Radiculopathy, lumbar region: Secondary | ICD-10-CM

## 2024-03-17 ENCOUNTER — Ambulatory Visit
Admission: RE | Admit: 2024-03-17 | Discharge: 2024-03-17 | Disposition: A | Discharge status: Home / Self Care | Source: Ambulatory Visit | Attending: Family Medicine | Admitting: Family Medicine

## 2024-03-17 DIAGNOSIS — M5416 Radiculopathy, lumbar region: Secondary | ICD-10-CM

## 2024-03-17 DIAGNOSIS — M25552 Pain in left hip: Secondary | ICD-10-CM

## 2024-05-08 ENCOUNTER — Other Ambulatory Visit (HOSPITAL_BASED_OUTPATIENT_CLINIC_OR_DEPARTMENT_OTHER): Payer: Self-pay
